# Patient Record
Sex: Male | Born: 1995 | Race: White | Hispanic: No | Marital: Single | Smoking: Never smoker
Health system: Southern US, Community
[De-identification: ages and names within clinical notes are randomized; demographics above are authoritative.]

## PROBLEM LIST (undated history)

## (undated) DIAGNOSIS — M5136 Other intervertebral disc degeneration, lumbar region: Secondary | ICD-10-CM

## (undated) DIAGNOSIS — Z9889 Other specified postprocedural states: Secondary | ICD-10-CM

## (undated) DIAGNOSIS — M51369 Other intervertebral disc degeneration, lumbar region without mention of lumbar back pain or lower extremity pain: Secondary | ICD-10-CM

## (undated) DIAGNOSIS — M5126 Other intervertebral disc displacement, lumbar region: Secondary | ICD-10-CM

## (undated) DIAGNOSIS — T8859XA Other complications of anesthesia, initial encounter: Secondary | ICD-10-CM

## (undated) DIAGNOSIS — R112 Nausea with vomiting, unspecified: Secondary | ICD-10-CM

## (undated) HISTORY — PX: WISDOM TOOTH EXTRACTION: SHX21

## (undated) HISTORY — PX: TONSILLECTOMY: SUR1361

---
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
## 2017-06-09 ENCOUNTER — Emergency Department (HOSPITAL_BASED_OUTPATIENT_CLINIC_OR_DEPARTMENT_OTHER)
Admission: EM | Admit: 2017-06-09 | Discharge: 2017-06-09 | Disposition: A | Discharge status: Home / Self Care | Payer: Worker's Compensation | Attending: Emergency Medicine | Admitting: Emergency Medicine

## 2017-06-09 ENCOUNTER — Encounter (HOSPITAL_BASED_OUTPATIENT_CLINIC_OR_DEPARTMENT_OTHER): Payer: Self-pay | Admitting: *Deleted

## 2017-06-09 ENCOUNTER — Other Ambulatory Visit: Payer: Self-pay

## 2017-06-09 ENCOUNTER — Emergency Department (HOSPITAL_BASED_OUTPATIENT_CLINIC_OR_DEPARTMENT_OTHER): Payer: Worker's Compensation

## 2017-06-09 DIAGNOSIS — Y9289 Other specified places as the place of occurrence of the external cause: Secondary | ICD-10-CM | POA: Diagnosis not present

## 2017-06-09 DIAGNOSIS — W19XXXA Unspecified fall, initial encounter: Secondary | ICD-10-CM | POA: Insufficient documentation

## 2017-06-09 DIAGNOSIS — S83095A Other dislocation of left patella, initial encounter: Secondary | ICD-10-CM | POA: Insufficient documentation

## 2017-06-09 DIAGNOSIS — Y990 Civilian activity done for income or pay: Secondary | ICD-10-CM | POA: Insufficient documentation

## 2017-06-09 DIAGNOSIS — Y9389 Activity, other specified: Secondary | ICD-10-CM | POA: Diagnosis not present

## 2017-06-09 DIAGNOSIS — S83005A Unspecified dislocation of left patella, initial encounter: Secondary | ICD-10-CM

## 2017-06-09 DIAGNOSIS — M545 Low back pain, unspecified: Secondary | ICD-10-CM

## 2017-06-09 DIAGNOSIS — S80922A Unspecified superficial injury of left lower leg, initial encounter: Secondary | ICD-10-CM | POA: Diagnosis present

## 2017-06-09 MED ORDER — NAPROXEN 500 MG PO TABS: 500.0000 mg | ORAL_TABLET | Freq: Two times a day (BID) | ORAL | 1 refills | Status: DC

## 2017-06-09 MED ORDER — HYDROCODONE-ACETAMINOPHEN 5-325 MG PO TABS: 1.0000 | ORAL_TABLET | ORAL | 0 refills | Status: DC | PRN

## 2017-06-09 NOTE — ED Triage Notes (Addendum)
Pt jumped off back of truck while working and left kneecap dislocated but went back in place. States he is having pain and kneecap has slipped out since original injury. He is wearing a family member's brace from home

## 2017-06-09 NOTE — Discharge Instructions (Signed)
Return if any problems. Call Dr. Barbaraann Barthel on Monday to be seeen.

## 2017-06-10 NOTE — ED Provider Notes (Signed)
[facility] Provider Note   CSN: [id number] Arrival date & time: 06/09/17  2003     History   Chief Complaint Chief Complaint  Patient presents with  . Leg Pain    HPI Gordon [name] is a 22 y.o. male.  The history is provided by the patient. No language interpreter was used.  Leg Pain   This is a new problem. The current episode started 1 to 2 hours ago. The problem occurs constantly. The problem has not changed since onset.The pain is present in the left knee. The pain is moderate. Associated symptoms include limited range of motion. He has tried nothing for the symptoms. The treatment provided no relief. There has been no history of extremity trauma.  Pt complains that his knee dislocated when he fell at work.    Past Medical History:  Diagnosis Date  . Bulging lumbar disc     Patient Active Problem List   Diagnosis Date Noted  . Low back pain 03/25/2014    Past Surgical History:  Procedure Laterality Date  . TONSILLECTOMY    . WISDOM TOOTH EXTRACTION          Home Medications    Prior to Admission medications   Medication Sig Start Date End Date Taking? Authorizing Provider  cetirizine (ZYRTEC) 10 MG tablet Take 10 mg by mouth daily.   Yes [provider]  HYDROcodone-acetaminophen (NORCO/VICODIN) 5-325 MG tablet Take 1-2 tablets by mouth every 4 (four) hours as needed. 06/09/17   Fransico Meadow, PA-C  metaxalone (SKELAXIN) 800 MG tablet Take 1 tablet (800 mg total) by mouth 3 (three) times daily. 08/30/15   Hudnall, Sharyn Lull, MD  naproxen (NAPROSYN) 500 MG tablet Take 1 tablet (500 mg total) by mouth 2 (two) times daily with a meal. Start AFTER finishing prednisone 06/09/17   Fransico Meadow, PA-C  predniSONE (DELTASONE) 10 MG tablet 6 tabs po days 1-2, 5 tabs po days 3-4, 4 tabs po days 5-6, 3 tabs po days 7-8, 2 tabs po days 9-10, 1 tab po days 11-12 08/30/15   Hudnall, Sharyn Lull, MD  traMADol (ULTRAM) 50 MG tablet Take  1 tablet (50 mg total) by mouth every 8 (eight) hours as needed. 06/24/15   Dene Gentry, MD    Family History No family history on file.  Social History Social History   Tobacco Use  . Smoking status: Never Smoker  Substance Use Topics  . Alcohol use: No    Alcohol/week: 0.0 oz  . Drug use: No     Allergies   Patient has no known allergies.   Review of Systems Review of Systems  Musculoskeletal: Positive for joint swelling.  All other systems reviewed and are negative.    Physical Exam Updated Vital Signs BP 130/71 (BP Location: Left Leg)   Pulse 95   Temp 99.7 F (37.6 C) (Oral)   Resp 18   Ht 6\' 3"  (1.905 m)   Wt 97.5 kg (215 lb)   SpO2 100%   BMI 26.87 kg/m   Physical Exam  Constitutional: He appears well-developed and well-nourished.  HENT:  Head: Normocephalic.  Musculoskeletal: He exhibits tenderness.  Swollen left knee, pain with movement,  Nv and ns intact   Neurological: He is alert.  Skin: Skin is warm.  Psychiatric: He has a normal mood and affect.  Nursing note and vitals reviewed.    ED Treatments / Results  Labs (all labs ordered are listed, but  only abnormal results are displayed) Labs Reviewed - No data to display  EKG None  Radiology Dg Knee Complete 4 Views Left  Result Date: 06/09/2017 CLINICAL DATA:  Left knee pain after jumping off truck. EXAM: LEFT KNEE - COMPLETE 4+ VIEW COMPARISON:  None. FINDINGS: No evidence of fracture, dislocation, or joint effusion. No evidence of arthropathy or other focal bone abnormality. Soft tissues are unremarkable. IMPRESSION: Negative. Electronically Signed   By: Rolm Baptise M.D.   On: 06/09/2017 20:41    Procedures Procedures (including critical care time)  Medications Ordered in ED Medications - No data to display   Initial Impression / Assessment and Plan / ED Course  I have reviewed the triage vital signs and the nursing notes.  Pertinent labs & imaging results that were  available during my care of the patient were reviewed by me and considered in my medical decision making (see chart for details).     Xray no fracture.  Pt placed in a knee imbolizer.  Pt advised to follow up with Dr. Barbaraann Barthel or workers comp Orthopaedist this week.   Final Clinical Impressions(s) / ED Diagnoses   Final diagnoses:  Patellar dislocation, left, initial encounter    ED Discharge Orders        Ordered    naproxen (NAPROSYN) 500 MG tablet  2 times daily with meals     06/09/17 2058    HYDROcodone-acetaminophen (NORCO/VICODIN) 5-325 MG tablet  Every 4 hours PRN     06/09/17 2107    An After Visit Summary was printed and given to the patient.    Fransico Meadow, [place] 06/10/17 1435    Quintella Reichert, MD 06/10/17 [phone]

## 2017-06-11 ENCOUNTER — Encounter: Payer: Self-pay | Admitting: Family Medicine

## 2017-06-11 ENCOUNTER — Ambulatory Visit (INDEPENDENT_AMBULATORY_CARE_PROVIDER_SITE_OTHER): Payer: Worker's Compensation | Admitting: Family Medicine

## 2017-06-11 DIAGNOSIS — S8992XA Unspecified injury of left lower leg, initial encounter: Secondary | ICD-10-CM | POA: Diagnosis not present

## 2017-06-11 MED ORDER — HYDROCODONE-ACETAMINOPHEN 5-325 MG PO TABS: 1.0000 | ORAL_TABLET | Freq: Four times a day (QID) | ORAL | 0 refills | Status: DC | PRN

## 2017-06-11 NOTE — Patient Instructions (Signed)
You have had a patellar dislocation (kneecap). Ice the area for 15 minutes at a time 3-4 times a day. Elevate above the level of your heart when possible. Take aleve 2 tabs twice a day with food for pain and inflammation. Norco as needed for severe pain. Knee immobilizer for 2 weeks then return for follow-up. Do not try to bend your knee - take immobilizer off only to wash the area and ice it. Quad sets and straight leg raises when tolerated up to 3 sets of 10 once a day. We will consider hinged knee brace after the immobilization period. We will start physical therapy when you return for follow-up. If struggling we will consider an MRI.

## 2017-06-13 ENCOUNTER — Encounter: Payer: Self-pay | Admitting: Family Medicine

## 2017-06-13 DIAGNOSIS — S8992XD Unspecified injury of left lower leg, subsequent encounter: Secondary | ICD-10-CM | POA: Insufficient documentation

## 2017-06-13 NOTE — Assessment & Plan Note (Signed)
independently reviewed radiographs and no evidence fracture.  2/2 patellar dislocation.  Continue immobilizer.  Elevation, icing.  Aleve with norco as needed.  Quad sets, straight leg raises when tolerated.  F/u in 2 weeks.

## 2017-06-13 NOTE — Progress Notes (Signed)
PCP: Chesley Noon, MD  Subjective:   HPI: Patient is a 21 y.o. male here for left knee injury.  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 Showed me a video of the injury where his patella dislocated laterally and within a few seconds popped back into place. This has felt unstable anteriorly since the injury. Wearing immobilizer and using crutches which help. Pain level 5-6/10 and sharp. Taking oxycodone occasionally. No prior knee injuries. + swelling and bruising.  Localized numbness.  Past Medical History:  Diagnosis Date  . Bulging lumbar disc     Current Outpatient Medications on File Prior to Visit  Medication Sig Dispense Refill  . cetirizine (ZYRTEC) 10 MG tablet Take 10 mg by mouth daily.     No current facility-administered medications on file prior to visit.     Past Surgical History:  Procedure Laterality Date  . TONSILLECTOMY    . WISDOM TOOTH EXTRACTION      No Known Allergies  Social History   Socioeconomic History  . Marital status: Single    Spouse name: Not on file  . Number of children: Not on file  . Years of education: Not on file  . Highest education level: Not on file  Occupational History  . Not on file  Social Needs  . Financial resource strain: Not on file  . Food insecurity:    Worry: Not on file    Inability: Not on file  . Transportation needs:    Medical: Not on file    Non-medical: Not on file  Tobacco Use  . Smoking status: Never Smoker  . Smokeless tobacco: Never Used  Substance and Sexual Activity  . Alcohol use: No    Alcohol/week: 0.0 oz  . Drug use: No  . Sexual activity: Not on file  Lifestyle  . Physical activity:    Days per week: Not on file    Minutes per session: Not on file  . Stress: Not on file  Relationships  . Social connections:    Talks on phone: Not on file    Gets together: Not on file    Attends religious  service: Not on file    Active member of club or organization: Not on file    Attends meetings of clubs or organizations: Not on file    Relationship status: Not on file  . Intimate partner violence:    Fear of current or ex partner: Not on file    Emotionally abused: Not on file    Physically abused: Not on file    Forced sexual activity: Not on file  Other Topics Concern  . Not on file  Social History Narrative  . Not on file    History reviewed. No pertinent family history.  Ht 6\' 3"  (1.905 m)   Wt 215 lb (97.5 kg)   BMI 26.87 kg/m   Review of Systems: See HPI above.     Objective:  Physical Exam:  Gen: NAD, comfortable in exam room  Left knee: Severe swelling, bruising anteriorly. TTP diffusely anteriorly including joint lines, post patellar facets. Full extension with strength to do straight leg raise.  Did not test flexion due to injury type. NV intact distally.  Right knee: No deformity. FROM with 5/5 strength. No tenderness to palpation. NVI distally.   Assessment & Plan:  1. Left knee injury - independently reviewed radiographs  and no evidence fracture.  2/2 patellar dislocation.  Continue immobilizer.  Elevation, icing.  Aleve with norco as needed.  Quad sets, straight leg raises when tolerated.  F/u in 2 weeks.

## 2017-06-25 ENCOUNTER — Encounter: Payer: Self-pay | Admitting: Family Medicine

## 2017-06-25 ENCOUNTER — Ambulatory Visit (INDEPENDENT_AMBULATORY_CARE_PROVIDER_SITE_OTHER): Payer: Worker's Compensation | Admitting: Family Medicine

## 2017-06-25 DIAGNOSIS — S8992XD Unspecified injury of left lower leg, subsequent encounter: Secondary | ICD-10-CM | POA: Diagnosis not present

## 2017-06-25 DIAGNOSIS — S8992XA Unspecified injury of left lower leg, initial encounter: Secondary | ICD-10-CM

## 2017-06-25 NOTE — Patient Instructions (Signed)
This is still most likely a patellar dislocation but it's very unusual that you have medial joint line pain, no pain around the kneecap now.  It brings up a concern for medial meniscus tear, possible ligament tear of your knee. We will go ahead with an MRI. Ice the area for 15 minutes at a time 3-4 times a day. Elevate above the level of your heart when possible. Take aleve 2 tabs twice a day with food for pain and inflammation. Norco as needed for severe pain. Switch to hinged knee brace - wear at all times when up and walking around. Quad sets and straight leg raises when tolerated up to 3 sets of 10 once a day. Start physical therapy as well. Follow up in 1 month but we will talk sooner when we get the MRI results.

## 2017-06-27 ENCOUNTER — Encounter: Payer: Self-pay | Admitting: Family Medicine

## 2017-06-27 NOTE — Progress Notes (Signed)
PCP: Chesley Noon, MD  Subjective:   HPI: Patient is a 22 y.o. male here for left knee injury.  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 Showed me a video of the injury where his patella dislocated laterally and within a few seconds popped back into place. This has felt unstable anteriorly since the injury. Wearing immobilizer and using crutches which help. Pain level 5-6/10 and sharp. Taking oxycodone occasionally. No prior knee injuries. + swelling and bruising.  Localized numbness.  6/3: Patient returns feeling about the same as last visit. Pain level is 5 out of 10 and sharp anterior left knee. He has been icing and wearing the immobilizer. His pain is worse at night when he takes Norco as needed. Continues to have swelling and bruising. Localized numbness as well.  Past Medical History:  Diagnosis Date  . Bulging lumbar disc     Current Outpatient Medications on File Prior to Visit  Medication Sig Dispense Refill  . cetirizine (ZYRTEC) 10 MG tablet Take 10 mg by mouth daily.    Marland Kitchen HYDROcodone-acetaminophen (NORCO/VICODIN) 5-325 MG tablet Take 1 tablet by mouth every 6 (six) hours as needed. 20 tablet 0   No current facility-administered medications on file prior to visit.     Past Surgical History:  Procedure Laterality Date  . TONSILLECTOMY    . WISDOM TOOTH EXTRACTION      No Known Allergies  Social History   Socioeconomic History  . Marital status: Single    Spouse name: Not on file  . Number of children: Not on file  . Years of education: Not on file  . Highest education level: Not on file  Occupational History  . Not on file  Social Needs  . Financial resource strain: Not on file  . Food insecurity:    Worry: Not on file    Inability: Not on file  . Transportation needs:    Medical: Not on file    Non-medical: Not on file  Tobacco Use  . Smoking  status: Never Smoker  . Smokeless tobacco: Never Used  Substance and Sexual Activity  . Alcohol use: No    Alcohol/week: 0.0 oz  . Drug use: No  . Sexual activity: Not on file  Lifestyle  . Physical activity:    Days per week: Not on file    Minutes per session: Not on file  . Stress: Not on file  Relationships  . Social connections:    Talks on phone: Not on file    Gets together: Not on file    Attends religious service: Not on file    Active member of club or organization: Not on file    Attends meetings of clubs or organizations: Not on file    Relationship status: Not on file  . Intimate partner violence:    Fear of current or ex partner: Not on file    Emotionally abused: Not on file    Physically abused: Not on file    Forced sexual activity: Not on file  Other Topics Concern  . Not on file  Social History Narrative  . Not on file    History reviewed. No pertinent family history.  BP 127/75   Pulse 81   Ht 6\' 3"  (1.905 m)   Wt 215 lb (97.5 kg)   BMI 26.87 kg/m   Review of Systems: See  HPI above.     Objective:  Physical Exam:  Gen: NAD, comfortable in exam room  Left knee: Mild improvement in severe swelling and bruising but still present anteriorly.   TTP medial joint line.  No other tenderness including post patellar facets.   ROM limited - 0 - 30 degrees only, painful. Unable to perform ant/post drawers.  Mild laxity valgus, negative varus.  Negative lachmanns. Negative mcmurrays, apleys, patellar apprehension. NV intact distally.  Assessment & Plan:  1. Left knee injury - 2 weeks out from his injury.  Radiographs were negative.  His history, video suggest patellar dislocation but his medial ligamentous structures are intact and he has no pain or laxity on patellar apprehension which are very unusual if this were the true diagnosis only 2 weeks out from the injury.  Concern for multiligamentous injury as well with instability that can cause  appearance of patellar dislocation.  Advised we go ahead with MRI at this time to further assess.  Change to hinged knee brace.  Home exercises, start physical therapy.  Aleve with icing.  norco if needed.

## 2017-06-27 NOTE — Assessment & Plan Note (Signed)
2 weeks out from his injury.  Radiographs were negative.  His history, video suggest patellar dislocation but his medial ligamentous structures are intact and he has no pain or laxity on patellar apprehension which are very unusual if this were the true diagnosis only 2 weeks out from the injury.  Concern for multiligamentous injury as well with instability that can cause appearance of patellar dislocation.  Advised we go ahead with MRI at this time to further assess.  Change to hinged knee brace.  Home exercises, start physical therapy.  Aleve with icing.  norco if needed.

## 2017-07-03 ENCOUNTER — Encounter: Payer: Self-pay | Admitting: Family Medicine

## 2017-07-03 ENCOUNTER — Ambulatory Visit (INDEPENDENT_AMBULATORY_CARE_PROVIDER_SITE_OTHER): Payer: Worker's Compensation | Admitting: Family Medicine

## 2017-07-03 DIAGNOSIS — S8992XD Unspecified injury of left lower leg, subsequent encounter: Secondary | ICD-10-CM

## 2017-07-03 NOTE — Patient Instructions (Signed)
Continue the physical therapy for 4 weeks then follow up with me. Wear the brace when up and walking around. Icing 15 minutes at a time, elevation as needed.

## 2017-07-04 ENCOUNTER — Encounter: Payer: Self-pay | Admitting: Family Medicine

## 2017-07-04 NOTE — Assessment & Plan Note (Signed)
MRI reviewed and discussed with patient.  He does have evidence of patellar dislocation with MPFL tear.  Surprising there's not additional damage and that he has much less pain than is expected with negative apprehension test only a couple weeks out from injury.  Knee brace when up and walking around - continue physical therapy.  Out of work for 4 more weeks, plan to follow up at that time.  Icing, elevation as needed.

## 2017-07-04 NOTE — Progress Notes (Signed)
MRI reviewed and discussed with patient.  He does have evidence of patellar dislocation with MPFL tear.  Surprising there's not additional damage and that he has much less pain than is expected with negative apprehension test only a couple weeks out from injury.  Knee brace when up and walking around - continue physical therapy.  Out of work for 4 more weeks, plan to follow up at that time.  Icing, elevation as needed.

## 2017-07-11 ENCOUNTER — Encounter: Payer: Self-pay | Admitting: Family Medicine

## 2017-07-31 ENCOUNTER — Encounter: Payer: Self-pay | Admitting: Family Medicine

## 2017-07-31 ENCOUNTER — Ambulatory Visit (INDEPENDENT_AMBULATORY_CARE_PROVIDER_SITE_OTHER): Payer: Worker's Compensation | Admitting: Family Medicine

## 2017-07-31 DIAGNOSIS — S8992XD Unspecified injury of left lower leg, subsequent encounter: Secondary | ICD-10-CM | POA: Diagnosis not present

## 2017-07-31 NOTE — Patient Instructions (Signed)
Wear the brace for 6 more weeks at work and when on irregular surfaces. Do home exercises 3 times a week for 6 more weeks.

## 2017-08-01 ENCOUNTER — Encounter: Payer: Self-pay | Admitting: Family Medicine

## 2017-08-01 NOTE — Progress Notes (Signed)
PCP: Chesley Noon, MD  Subjective:   HPI: Patient is a 22 y.o. male here for left knee injury.  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 Showed me a video of the injury where his patella dislocated laterally and within a few seconds popped back into place. This has felt unstable anteriorly since the injury. Wearing immobilizer and using crutches which help. Pain level 5-6/10 and sharp. Taking oxycodone occasionally. No prior knee injuries. + swelling and bruising.  Localized numbness.  6/3: Patient returns feeling about the same as last visit. Pain level is 5 out of 10 and sharp anterior left knee. He has been icing and wearing the immobilizer. His pain is worse at night when he takes Norco as needed. Continues to have swelling and bruising. Localized numbness as well.  7/9: Patient reports he's doing well. Pain level 0/10. Wearing knee brace, doing home exercises and has been doing physical therapy. No skin changes.  Past Medical History:  Diagnosis Date  . Bulging lumbar disc     Current Outpatient Medications on File Prior to Visit  Medication Sig Dispense Refill  . cetirizine (ZYRTEC) 10 MG tablet Take 10 mg by mouth daily.    Marland Kitchen HYDROcodone-acetaminophen (NORCO/VICODIN) 5-325 MG tablet Take 1 tablet by mouth every 6 (six) hours as needed. 20 tablet 0   No current facility-administered medications on file prior to visit.     Past Surgical History:  Procedure Laterality Date  . TONSILLECTOMY    . WISDOM TOOTH EXTRACTION      No Known Allergies  Social History   Socioeconomic History  . Marital status: Single    Spouse name: Not on file  . Number of children: Not on file  . Years of education: Not on file  . Highest education level: Not on file  Occupational History  . Not on file  Social Needs  . Financial resource strain: Not on file  . Food insecurity:     Worry: Not on file    Inability: Not on file  . Transportation needs:    Medical: Not on file    Non-medical: Not on file  Tobacco Use  . Smoking status: Never Smoker  . Smokeless tobacco: Never Used  Substance and Sexual Activity  . Alcohol use: No    Alcohol/week: 0.0 oz  . Drug use: No  . Sexual activity: Not on file  Lifestyle  . Physical activity:    Days per week: Not on file    Minutes per session: Not on file  . Stress: Not on file  Relationships  . Social connections:    Talks on phone: Not on file    Gets together: Not on file    Attends religious service: Not on file    Active member of club or organization: Not on file    Attends meetings of clubs or organizations: Not on file    Relationship status: Not on file  . Intimate partner violence:    Fear of current or ex partner: Not on file    Emotionally abused: Not on file    Physically abused: Not on file    Forced sexual activity: Not on file  Other Topics Concern  . Not on file  Social History Narrative  . Not on file    History reviewed. No pertinent family history.  BP 113/69   Pulse 66  Ht 6\' 3"  (1.905 m)   Wt 215 lb (97.5 kg)   BMI 26.87 kg/m   Review of Systems: See HPI above.     Objective:  Physical Exam:  Gen: NAD, comfortable in exam room  Left knee: No gross deformity, ecchymoses, effusion. No TTP. FROM with 5/5 strength. Negative ant/post drawers. Negative valgus/varus testing. Negative lachmanns. Negative mcmurrays, apleys, patellar apprehension. NV intact distally.  Assessment & Plan:  1. Left knee injury - 2/2 patellar dislocation with MPFL tear.  Clinically improved following physical therapy, home exercises.  Discontinue physical therapy - continue home exercises 3 times a week for 6 more weeks.  Wear knee brace for 6 more weeks at work and when on irregular surfaces.  Icing, tylenol, ibuprofen only if needed.

## 2017-08-01 NOTE — Assessment & Plan Note (Signed)
2/2 patellar dislocation with MPFL tear.  Clinically improved following physical therapy, home exercises.  Discontinue physical therapy - continue home exercises 3 times a week for 6 more weeks.  Wear knee brace for 6 more weeks at work and when on irregular surfaces.  Icing, tylenol, ibuprofen only if needed.

## 2020-04-02 ENCOUNTER — Emergency Department (HOSPITAL_COMMUNITY): Payer: 59

## 2020-04-02 ENCOUNTER — Inpatient Hospital Stay (HOSPITAL_BASED_OUTPATIENT_CLINIC_OR_DEPARTMENT_OTHER)
Admission: EM | Admit: 2020-04-02 | Discharge: 2020-04-06 | DRG: 027 | Disposition: A | Discharge status: Home / Self Care | Payer: 59 | Attending: Neurological Surgery | Admitting: Neurological Surgery

## 2020-04-02 ENCOUNTER — Emergency Department (HOSPITAL_BASED_OUTPATIENT_CLINIC_OR_DEPARTMENT_OTHER): Payer: 59

## 2020-04-02 ENCOUNTER — Other Ambulatory Visit: Payer: Self-pay

## 2020-04-02 ENCOUNTER — Encounter (HOSPITAL_BASED_OUTPATIENT_CLINIC_OR_DEPARTMENT_OTHER): Payer: Self-pay | Admitting: *Deleted

## 2020-04-02 DIAGNOSIS — W19XXXA Unspecified fall, initial encounter: Secondary | ICD-10-CM | POA: Diagnosis present

## 2020-04-02 DIAGNOSIS — C719 Malignant neoplasm of brain, unspecified: Secondary | ICD-10-CM | POA: Diagnosis present

## 2020-04-02 DIAGNOSIS — Z792 Long term (current) use of antibiotics: Secondary | ICD-10-CM | POA: Diagnosis not present

## 2020-04-02 DIAGNOSIS — R569 Unspecified convulsions: Secondary | ICD-10-CM | POA: Diagnosis not present

## 2020-04-02 DIAGNOSIS — R253 Fasciculation: Secondary | ICD-10-CM | POA: Diagnosis present

## 2020-04-02 DIAGNOSIS — D496 Neoplasm of unspecified behavior of brain: Secondary | ICD-10-CM | POA: Diagnosis present

## 2020-04-02 DIAGNOSIS — Z20822 Contact with and (suspected) exposure to covid-19: Secondary | ICD-10-CM | POA: Diagnosis present

## 2020-04-02 DIAGNOSIS — G939 Disorder of brain, unspecified: Secondary | ICD-10-CM | POA: Diagnosis not present

## 2020-04-02 DIAGNOSIS — M5126 Other intervertebral disc displacement, lumbar region: Secondary | ICD-10-CM | POA: Diagnosis present

## 2020-04-02 DIAGNOSIS — G9389 Other specified disorders of brain: Principal | ICD-10-CM

## 2020-04-02 LAB — URINALYSIS, MICROSCOPIC (REFLEX): Bacteria, UA: NONE SEEN

## 2020-04-02 LAB — CBC WITH DIFFERENTIAL/PLATELET
Abs Immature Granulocytes: 0.04 10*3/uL (ref 0.00–0.07)
Basophils Absolute: 0.1 10*3/uL (ref 0.0–0.1)
Basophils Relative: 0 %
Eosinophils Absolute: 0.1 10*3/uL (ref 0.0–0.5)
Eosinophils Relative: 1 %
HCT: 44.6 % (ref 39.0–52.0)
Hemoglobin: 15.7 g/dL (ref 13.0–17.0)
Immature Granulocytes: 0 %
Lymphocytes Relative: 17 %
Lymphs Abs: 2.1 10*3/uL (ref 0.7–4.0)
MCH: 30.4 pg (ref 26.0–34.0)
MCHC: 35.2 g/dL (ref 30.0–36.0)
MCV: 86.3 fL (ref 80.0–100.0)
Monocytes Absolute: 0.6 10*3/uL (ref 0.1–1.0)
Monocytes Relative: 5 %
Neutro Abs: 9.7 10*3/uL — ABNORMAL HIGH (ref 1.7–7.7)
Neutrophils Relative %: 77 %
Platelets: 331 10*3/uL (ref 150–400)
RBC: 5.17 MIL/uL (ref 4.22–5.81)
RDW: 12.6 % (ref 11.5–15.5)
WBC: 12.5 10*3/uL — ABNORMAL HIGH (ref 4.0–10.5)
nRBC: 0 % (ref 0.0–0.2)

## 2020-04-02 LAB — COMPREHENSIVE METABOLIC PANEL
ALT: 15 U/L (ref 0–44)
AST: 22 U/L (ref 15–41)
Albumin: 5.1 g/dL — ABNORMAL HIGH (ref 3.5–5.0)
Alkaline Phosphatase: 49 U/L (ref 38–126)
Anion gap: 12 (ref 5–15)
BUN: 13 mg/dL (ref 6–20)
CO2: 24 mmol/L (ref 22–32)
Calcium: 10 mg/dL (ref 8.9–10.3)
Chloride: 102 mmol/L (ref 98–111)
Creatinine, Ser: 1.04 mg/dL (ref 0.61–1.24)
GFR, Estimated: >60 mL/min (ref >60)
Glucose, Bld: 102 mg/dL — ABNORMAL HIGH (ref 70–99)
Potassium: 3.7 mmol/L (ref 3.5–5.1)
Sodium: 138 mmol/L (ref 135–145)
Total Bilirubin: 0.3 mg/dL (ref 0.3–1.2)
Total Protein: 8 g/dL (ref 6.5–8.1)

## 2020-04-02 LAB — RAPID URINE DRUG SCREEN, HOSP PERFORMED
Amphetamines: NOT DETECTED
Barbiturates: NOT DETECTED
Benzodiazepines: NOT DETECTED
Cocaine: NOT DETECTED
Opiates: NOT DETECTED
Tetrahydrocannabinol: NOT DETECTED

## 2020-04-02 LAB — URINALYSIS, ROUTINE W REFLEX MICROSCOPIC
Bilirubin Urine: NEGATIVE
Glucose, UA: NEGATIVE mg/dL
Ketones, ur: NEGATIVE mg/dL
Leukocytes,Ua: NEGATIVE
Nitrite: NEGATIVE
Protein, ur: NEGATIVE mg/dL
Specific Gravity, Urine: 1.01 (ref 1.005–1.030)
pH: 6 (ref 5.0–8.0)

## 2020-04-02 LAB — RESP PANEL BY RT-PCR (FLU A&B, COVID) ARPGX2
Influenza A by PCR: NEGATIVE
Influenza B by PCR: NEGATIVE
SARS Coronavirus 2 by RT PCR: NEGATIVE

## 2020-04-02 LAB — CK: Total CK: 335 U/L (ref 49–397)

## 2020-04-02 MED ORDER — SODIUM CHLORIDE 0.9 % IV BOLUS
1000.0000 mL | Freq: Once | INTRAVENOUS | Status: AC
  Administered 2020-04-02: 1000 mL via INTRAVENOUS

## 2020-04-02 MED ORDER — ACETAMINOPHEN 500 MG PO TABS
1000.0000 mg | ORAL_TABLET | Freq: Once | ORAL | Status: AC
  Administered 2020-04-03: 1000 mg via ORAL
  Filled 2020-04-02: qty 2

## 2020-04-02 MED ORDER — GADOBUTROL 1 MMOL/ML IV SOLN
10.0000 mL | Freq: Once | INTRAVENOUS | Status: AC | PRN
  Administered 2020-04-02: 10 mL via INTRAVENOUS

## 2020-04-02 MED ORDER — LEVETIRACETAM IN NACL 1000 MG/100ML IV SOLN
INTRAVENOUS | Status: AC
  Filled 2020-04-02: qty 100

## 2020-04-02 MED ORDER — LEVETIRACETAM IN NACL 1000 MG/100ML IV SOLN
1000.0000 mg | Freq: Once | INTRAVENOUS | Status: AC
  Administered 2020-04-02: 1000 mg via INTRAVENOUS
  Filled 2020-04-02: qty 100

## 2020-04-02 NOTE — ED Notes (Signed)
[facility] Transport Team at bedside 

## 2020-04-02 NOTE — H&P (Signed)
History and Physical   Gordon [name] Merry CNO:[id number] DOB: 10/10/1995 DOA: 04/02/2020  Referring MD/NP/PA: Dr. Vanita Panda  PCP: Chesley Noon, MD   Outpatient Specialists: None  Patient coming from: Home  Chief Complaint: Seizure  HPI: Gordon [name] is a 25 y.o. male with medical history significant of mainly bulging lumbar disc but no other significant findings who was otherwise healthy until today while at home walking his parents dog at home started having twitching on his face as he is grabbed the back to handle hip literally passed out.  Home has video camera that recorded all the incidents.  Mother came a few hours later and found him down.  He was noted to have actually had first few minutes of tonic-clonic seizures went backward and heat bicarbon box near the door.  His target.  He was postictal for more than 10 minutes.  He apparently got himself up to a chair and got inside the house.  He is currently having some mild headache.  In the ER he was evaluated and found to have a lesion in his brain that looks like a mass versus ischemia.  Patient admitted to the hospital after neurology evaluation.  No prior history of seizure disorder.  No prior history of head trauma..  ED Course: Temperature 98.4 blood pressure 156/82 pulse 98 respirate of 22 and oxygen sat 96% on room air.COVID-19 screen is negative.  White count is 12.5 otherwise CBC and chemistry mostly within normal.  Urinalysis, urine drug screen and COVID-19 screen all negative.  Head CT without contrast showed an ill-defined area of cortical and subcortical low density in the left frontal lobe questionably edema and trace left to right midline shift.  Suspicion for neoplasm.  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  Neurology is consulted.  Patient being admitted to the medical service.  Review of Systems: As per HPI  otherwise 10 point review of systems negative.    Past Medical History:  Diagnosis Date  . Bulging lumbar disc     Past Surgical History:  Procedure Laterality Date  . TONSILLECTOMY    . WISDOM TOOTH EXTRACTION       reports that he has never smoked. He has never used smokeless tobacco. He reports that he does not drink alcohol and does not use drugs.  No Known Allergies  History reviewed. No pertinent family history.   Prior to Admission medications   Medication Sig Start Date End Date Taking? Authorizing Provider  amoxicillin (AMOXIL) 500 MG capsule Take 500 mg by mouth 3 (three) times daily. 10/28/19   [provider]    Physical Exam: Vitals:   04/02/20 1909 04/02/20 2014 04/02/20 2130 04/02/20 2204  BP: (!) 156/82 118/70 119/65   Pulse: 82 78 76   Resp: 18 16 19    Temp:    98.4 F (36.9 C)  TempSrc:    Oral  SpO2: 100% 100% 96%   Weight:      Height:          Constitutional: No distress Vitals:   04/02/20 1909 04/02/20 2014 04/02/20 2130 04/02/20 2204  BP: (!) 156/82 118/70 119/65   Pulse: 82 78 76   Resp: 18 16 19    Temp:    98.4 F (36.9 C)  TempSrc:    Oral  SpO2: 100% 100% 96%   Weight:      Height:       Eyes:  PERRL, lids and conjunctivae normal ENMT: Mucous membranes are moist. Posterior pharynx clear of any exudate or lesions.Normal dentition.  Neck: normal, supple, no masses, no thyromegaly Respiratory: clear to auscultation bilaterally, no wheezing, no crackles. Normal respiratory effort. No accessory muscle use.  Cardiovascular: Regular rate and rhythm, no murmurs / rubs / gallops. No extremity edema. 2+ pedal pulses. No carotid bruits.  Abdomen: no tenderness, no masses palpated. No hepatosplenomegaly. Bowel sounds positive.  Musculoskeletal: no clubbing / cyanosis. No joint deformity upper and lower extremities. Good ROM, no contractures. Normal muscle tone.  Skin: no rashes, lesions, ulcers. No induration Neurologic: CN 2-12  grossly intact. Sensation mildly decreased on the right upper extremity, DTR normal. Strength 5/5 in all 4.  Psychiatric: Normal judgment and insight. Alert and oriented x 3. Normal mood.     Labs on Admission: I have personally reviewed following labs and imaging studies  CBC: Recent Labs  Lab 04/02/20 1730  WBC 12.5*  NEUTROABS 9.7*  HGB 15.7  HCT 44.6  MCV 86.3  PLT 010   Basic Metabolic Panel: Recent Labs  Lab 04/02/20 1730  NA 138  K 3.7  CL 102  CO2 24  GLUCOSE 102*  BUN 13  CREATININE 1.04  CALCIUM 10.0   GFR: Estimated Creatinine Clearance: 132 mL/min (by C-G formula based on SCr of 1.04 mg/dL). Liver Function Tests: Recent Labs  Lab 04/02/20 1730  AST 22  ALT 15  ALKPHOS 49  BILITOT 0.3  PROT 8.0  ALBUMIN 5.1*   No results for input(s): LIPASE, AMYLASE in the last 168 hours. No results for input(s): AMMONIA in the last 168 hours. Coagulation Profile: No results for input(s): INR, PROTIME in the last 168 hours. Cardiac Enzymes: Recent Labs  Lab 04/02/20 1730  CKTOTAL 335   BNP (last 3 results) No results for input(s): PROBNP in the last 8760 hours. HbA1C: No results for input(s): HGBA1C in the last 72 hours. CBG: No results for input(s): GLUCAP in the last 168 hours. Lipid Profile: No results for input(s): CHOL, HDL, LDLCALC, TRIG, CHOLHDL, LDLDIRECT in the last 72 hours. Thyroid Function Tests: No results for input(s): TSH, T4TOTAL, FREET4, T3FREE, THYROIDAB in the last 72 hours. Anemia Panel: No results for input(s): VITAMINB12, FOLATE, FERRITIN, TIBC, IRON, RETICCTPCT in the last 72 hours. Urine analysis:    Component Value Date/Time   COLORURINE STRAW (A) 04/02/2020 1751   APPEARANCEUR CLEAR 04/02/2020 1751   LABSPEC 1.010 04/02/2020 1751   PHURINE 6.0 04/02/2020 1751   GLUCOSEU NEGATIVE 04/02/2020 1751   HGBUR SMALL (A) 04/02/2020 1751   BILIRUBINUR NEGATIVE 04/02/2020 1751   KETONESUR NEGATIVE 04/02/2020 1751   PROTEINUR  NEGATIVE 04/02/2020 1751   UROBILINOGEN 0.2 08/10/2009 2245   NITRITE NEGATIVE 04/02/2020 1751   LEUKOCYTESUR NEGATIVE 04/02/2020 1751   Sepsis Labs: @LABRCNTIP (procalcitonin:4,lacticidven:4) ) Recent Results (from the past 240 hour(s))  Resp Panel by RT-PCR (Flu A&B, Covid) Nasopharyngeal Swab     Status: None   Collection Time: 04/02/20  7:08 PM   Specimen: Nasopharyngeal Swab; Nasopharyngeal(NP) swabs in vial transport medium  Result Value Ref Range Status   SARS Coronavirus 2 by RT PCR NEGATIVE NEGATIVE Final    Comment: (NOTE) SARS-CoV-2 target nucleic acids are NOT DETECTED.  The SARS-CoV-2 RNA is generally detectable in upper respiratory specimens during the acute phase of infection. The lowest concentration of SARS-CoV-2 viral copies this assay can detect is 138 copies/mL. A negative result does not preclude SARS-Cov-2 infection and should not be used as  the sole basis for treatment or other patient management decisions. A negative result may occur with  improper specimen collection/handling, submission of specimen other than nasopharyngeal swab, presence of viral mutation(s) within the areas targeted by this assay, and inadequate number of viral copies(<138 copies/mL). A negative result must be combined with clinical observations, patient history, and epidemiological information. The expected result is Negative.  Fact Sheet for Patients:  EntrepreneurPulse.com.au  Fact Sheet for Healthcare Providers:  IncredibleEmployment.be  This test is no t yet approved or cleared by the [place] FDA and  has been authorized for detection and/or diagnosis of SARS-CoV-2 by FDA under an Emergency Use Authorization (EUA). This EUA will remain  in effect (meaning this test can be used) for the duration of the COVID-19 declaration under Section 564(b)(1) of the Act, 21 U.S.C.section 360bbb-3(b)(1), unless the authorization is terminated  or  revoked sooner.       Influenza A by PCR NEGATIVE NEGATIVE Final   Influenza B by PCR NEGATIVE NEGATIVE Final    Comment: (NOTE) The Xpert Xpress SARS-CoV-2/FLU/RSV plus assay is intended as an aid in the diagnosis of influenza from Nasopharyngeal swab specimens and should not be used as a sole basis for treatment. Nasal washings and aspirates are unacceptable for Xpert Xpress SARS-CoV-2/FLU/RSV testing.  Fact Sheet for Patients: EntrepreneurPulse.com.au  Fact Sheet for Healthcare Providers: IncredibleEmployment.be  This test is not yet approved or cleared by the [place] FDA and has been authorized for detection and/or diagnosis of SARS-CoV-2 by FDA under an Emergency Use Authorization (EUA). This EUA will remain in effect (meaning this test can be used) for the duration of the COVID-19 declaration under Section 564(b)(1) of the Act, 21 U.S.C. section 360bbb-3(b)(1), unless the authorization is terminated or revoked.  Performed at [facility], [address] [place], [place] [address]      Radiological Exams on Admission: CT Head Wo Contrast  Result Date: 04/02/2020 CLINICAL DATA:  Seizure, nontraumatic (Age 67-40y) EXAM: CT HEAD WITHOUT CONTRAST TECHNIQUE: Contiguous axial images were obtained from the base of the skull through the vertex without intravenous contrast. COMPARISON:  None. FINDINGS: Brain: Ill-defined area of cortical and subcortical low-density involving the left frontal lobe, for example series 2, image 22. The may be mild surrounding edema. Low-density extends to the frontal horn of the left lateral ventricle. No hemorrhage. Trace left right midline shift. Normal ventricular size without hydrocephalus. Basilar cisterns are patent. Vascular: No hyperdense vessel. Skull: No fracture or focal lesion. Sinuses/Orbits: Minimal opacification of left ethmoid air cell. Otherwise clear paranasal sinuses. Mastoid air  cells are well aerated. Included orbits are unremarkable. Other: None. IMPRESSION: 1. Ill-defined area of cortical and subcortical low-density involving the left frontal lobe with questionable mild surrounding edema and trace left-to-right midline shift. Favor neoplasm, with differential of infection or infarct. Recommend further evaluation with MRI, without and with IV contrast. 2. No hemorrhage or midline shift. These results were called by telephone at the time of interpretation on 04/02/2020 at 6:29 pm to provider [facility] , who verbally acknowledged these results. Electronically Signed   By: Keith Rake M.D.   On: 04/02/2020 18:29      Assessment/Plan Principal Problem:   Seizure [facility]) Active Problems:   Brain mass     #1 new onset seizure: Secondary to brain mass.  Patient has been loaded with Keppra.  Neurology consulted.  Patient will likely need neurosurgical consultation also.  #2 new newfound brain mass: Suspicious for anaplastic  astrocytoma.  Again neurology consulted.  We will get neurosurgical consult in the morning.  Continue with Keppra and Decadron.   DVT prophylaxis: SCD Code Status: Full code Family Communication: Mother at bedside Disposition Plan: To be determined Consults called: Dr. Cheral Marker of neurology Admission status: Inpatient  Severity of Illness: The appropriate patient status for this patient is INPATIENT. Inpatient status is judged to be reasonable and necessary in order to provide the required intensity of service to ensure the patient's safety. The patient's presenting symptoms, physical exam findings, and initial radiographic and laboratory data in the context of their chronic comorbidities is felt to place them at high risk for further clinical deterioration. Furthermore, it is not anticipated that the patient will be medically stable for discharge from the hospital within 2 midnights of admission. The following factors support the patient  status of inpatient.   " The patient's presenting symptoms include seizure episode. " The worrisome physical exam findings include mild sensory deficit on the right side. " The initial radiographic and laboratory data are worrisome because of CT and MRI evidence of brain mass. " The chronic co-morbidities include none.   * I certify that at the point of admission it is my clinical judgment that the patient will require inpatient hospital care spanning beyond 2 midnights from the point of admission due to high intensity of service, high risk for further deterioration and high frequency of surveillance required.Barbette Merino MD [facility] Pager [phone]  If 7PM-7AM, please contact night-coverage [email or web address] Password [facility]  04/02/2020, 11:09 PM

## 2020-04-02 NOTE — ED Notes (Signed)
Noted to have abrasion at rt shoulder, both rt and left hand, slight abrasion noted at rt side of head and above rt eye.

## 2020-04-02 NOTE — ED Notes (Signed)
Phone Handoff Report given to [facility] with [employer]

## 2020-04-02 NOTE — ED Provider Notes (Signed)
[place] EMERGENCY DEPARTMENT Provider Note  CSN: [id number] Arrival date & time: 04/02/20 1706    History Chief Complaint  Patient presents with  . Seizures    HPI  Gordon [name] is a 25 y.o. male with no significant PMH reports he has been in his normal state of health recently. He came to his parent's house side door when he noted his face was twitching and when he grabbed the backdoor handle his hand would not let go. The next thing he remembers is being in a chair in his room. Mother came home a few hours later and after watching the security camera footage noted that he had fallen and was shaking all over. I was able to review the footage which begins around 12:30pm today. Patient began having a seizure when he was trying to get into the door, began with R arm and then spread to entire body, he fell sideways onto the door jam and then slowly lowered onto a cardboard box and then to the ground. Seizure lasted for 30-60 seconds and then stopped. Patient was on the ground for about 5minutes with sonorous respirations before he then tried to get up, staggered around for a few seconds before getting into a chair on the porch. At some point later on he managed to get into the house and then into his room but he does not remember any of those events. He is back to baseline now, has a mild headache. He has been feeling well in recent days, no headaches, lack of sleep. Has been eating and drinking well without nausea, vomiting or diarrhea. Denies any alcohol or drug use. No prior history of seizures.     Past Medical History:  Diagnosis Date  . Bulging lumbar disc     Past Surgical History:  Procedure Laterality Date  . TONSILLECTOMY    . WISDOM TOOTH EXTRACTION      History reviewed. No pertinent family history.  Social History   Tobacco Use  . Smoking status: Never Smoker  . Smokeless tobacco: Never Used  Substance Use Topics  . Alcohol use: No     Alcohol/week: 0.0 standard drinks  . Drug use: No     Home Medications Prior to Admission medications   Medication Sig Start Date End Date Taking? Authorizing Provider  amoxicillin (AMOXIL) 500 MG capsule Take 500 mg by mouth 3 (three) times daily. 10/28/19   [provider]     Allergies    Patient has no known allergies.   Review of Systems   Review of Systems A comprehensive review of systems was completed and negative except as noted in HPI.    Physical Exam BP 119/65   Pulse 76   Temp 98.4 F (36.9 C) (Oral)   Resp 19   Ht 6\' 3"  (1.905 m)   Wt 97.5 kg   SpO2 96%   BMI 26.87 kg/m   Physical Exam Vitals and nursing note reviewed.  Constitutional:      Appearance: Normal appearance.  HENT:     Head: Normocephalic and atraumatic.     Nose: Nose normal.     Mouth/Throat:     Mouth: Mucous membranes are moist.  Eyes:     Extraocular Movements: Extraocular movements intact.     Conjunctiva/sclera: Conjunctivae normal.     Pupils: Pupils are equal, round, and reactive to light.  Cardiovascular:     Rate and Rhythm: Normal rate.  Pulmonary:     Effort:  Pulmonary effort is normal.     Breath sounds: Normal breath sounds.  Abdominal:     General: Abdomen is flat.     Palpations: Abdomen is soft.     Tenderness: There is no abdominal tenderness.  Musculoskeletal:        General: No swelling. Normal range of motion.     Cervical back: Neck supple.  Skin:    General: Skin is warm and dry.  Neurological:     General: No focal deficit present.     Mental Status: He is alert. Mental status is at baseline.     Cranial Nerves: No cranial nerve deficit.     Sensory: Sensory deficit present.     Motor: No weakness.  Psychiatric:        Mood and Affect: Mood normal.      ED Results / Procedures / Treatments   Labs (all labs ordered are listed, but only abnormal results are displayed) Labs Reviewed  COMPREHENSIVE METABOLIC PANEL - Abnormal; Notable  for the following components:      Result Value   Glucose, Bld 102 (*)    Albumin 5.1 (*)    All other components within normal limits  CBC WITH DIFFERENTIAL/PLATELET - Abnormal; Notable for the following components:   WBC 12.5 (*)    Neutro Abs 9.7 (*)    All other components within normal limits  URINALYSIS, ROUTINE W REFLEX MICROSCOPIC - Abnormal; Notable for the following components:   Color, Urine STRAW (*)    Hgb urine dipstick SMALL (*)    All other components within normal limits  RESP PANEL BY RT-PCR (FLU A&B, COVID) ARPGX2  RAPID URINE DRUG SCREEN, HOSP PERFORMED  URINALYSIS, MICROSCOPIC (REFLEX)  CK  ETHANOL    EKG EKG Interpretation  Date/Time:  Friday April 02 2020 18:05:02 EST Ventricular Rate:  82 PR Interval:    QRS Duration: 92 QT Interval:  345 QTC Calculation: 403 R Axis:   82 Text Interpretation: Sinus rhythm Baseline wander in lead(s) V2 Normal ECG No old tracing to compare Confirmed by Calvert Cantor [id number]) on 04/02/2020 6:13:06 PM    Radiology CT Head Wo Contrast  Result Date: 04/02/2020 CLINICAL DATA:  Seizure, nontraumatic (Age 26-40y) EXAM: CT HEAD WITHOUT CONTRAST TECHNIQUE: Contiguous axial images were obtained from the base of the skull through the vertex without intravenous contrast. COMPARISON:  None. FINDINGS: Brain: Ill-defined area of cortical and subcortical low-density involving the left frontal lobe, for example series 2, image 22. The may be mild surrounding edema. Low-density extends to the frontal horn of the left lateral ventricle. No hemorrhage. Trace left right midline shift. Normal ventricular size without hydrocephalus. Basilar cisterns are patent. Vascular: No hyperdense vessel. Skull: No fracture or focal lesion. Sinuses/Orbits: Minimal opacification of left ethmoid air cell. Otherwise clear paranasal sinuses. Mastoid air cells are well aerated. Included orbits are unremarkable. Other: None. IMPRESSION: 1. Ill-defined area of  cortical and subcortical low-density involving the left frontal lobe with questionable mild surrounding edema and trace left-to-right midline shift. Favor neoplasm, with differential of infection or infarct. Recommend further evaluation with MRI, without and with IV contrast. 2. No hemorrhage or midline shift. These results were called by telephone at the time of interpretation on 04/02/2020 at 6:29 pm to provider [facility] , who verbally acknowledged these results. Electronically Signed   By: Keith Rake M.D.   On: 04/02/2020 18:29    Procedures Procedures  Medications Ordered in the ED Medications  acetaminophen (TYLENOL) tablet  1,000 mg (has no administration in time range)  sodium chloride 0.9 % bolus 1,000 mL (0 mLs Intravenous Stopped 04/02/20 2005)  levETIRAcetam (KEPPRA) IVPB 1000 mg/100 mL premix (0 mg Intravenous Stopped 04/02/20 1933)  gadobutrol (GADAVIST) 1 MMOL/ML injection 10 mL (10 mLs Intravenous Contrast Given 04/02/20 2235)     MDM Rules/Calculators/A&P MDM Patient with new onset seizure, witnessed on home security camera. Back to baseline now, will check labs and CT head. Continue to observe in the ED.   ED Course  I have reviewed the triage vital signs and the nursing notes.  Pertinent labs & imaging results that were available during my care of the patient were reviewed by me and considered in my medical decision making (see chart for details).  Clinical Course as of 04/02/20 2331  Fri Apr 02, 2020  1830 UA and UDS are neg. Reviewed CT images with radiologist, he has a L frontal lobe hypodensity concerning for mass. Reviewed the images with patient and mother at bedside. Spoke with Neurology, Dr. Curly Shores, who recommended patient come to the [facility] ED for an MRI then neurology and neurosurgery consults. She recommends starting Keppra (she initially recommended 750mg  however 1000mg  doses is all that we have available here). Dr. Vanita Panda, EDP at [facility] is aware of  patient.  [CS]  2007 CBC with mild leukocytosis, likely due to seizures [CS]  2020 CK and CMP are unremarkable. Covid is neg.  [CS]    Clinical Course User Index [CS] Truddie Hidden, MD    Final Clinical Impression(s) / ED Diagnoses Final diagnoses:  None    Rx / DC Orders ED Discharge Orders    None       Truddie Hidden, MD 04/02/20 2332

## 2020-04-02 NOTE — ED Triage Notes (Signed)
EMS reports pt was transferred in order to have an MRI completed. Pt had a seizure today, has no history of seizures, a mass was seen on CT scan. Given Keppra  1000mg  IV and 1L NS. BP - 134/67, HR 78, O2 sat 98% RA.

## 2020-04-02 NOTE — ED Triage Notes (Signed)
States was coming out of door at home, states his face began twitching, grab door handle, then was told he fell and hit his head and had what appeared to be a seizure.

## 2020-04-02 NOTE — ED Notes (Signed)
Brought to exam room 12 upon arrival, brought to room via wc, immediately obtained IV, neuro assessment obtained, placed on cont cardiac monitor, vs obtained.

## 2020-04-02 NOTE — ED Notes (Signed)
Phone Handoff Report given to Camera operator at [facility]

## 2020-04-02 NOTE — ED Provider Notes (Signed)
9:50 PM Patient seen on arrival from our affiliated facility.  I discussed the patient's case with our neurology colleagues, given his presentation for seizure, possible new brain mass versus infection, he has received initial Keppra loading at our other facility, will require admission for ongoing monitoring, management.  He and his mother are aware of the plan.  He has no new complaints, does have some mild ongoing headache.  Vital signs unremarkable. Neurology will consult in the ED. MR eye, with and without contrast ordered, pending.   Carmin Muskrat, MD 04/02/20 2151

## 2020-04-02 NOTE — ED Notes (Signed)
Mother has video of episode, appears to have very unstable gait, reaching for door and began falling backwards, rt arm appeared to very stiff with rt hand appears to be cramp like movement, then left arm then noted to do the same process, Hit head on cardboard box, appears that he did not actually hit head very hard on porch floor. Approx 20 min episode. EDP has reviewed video with pt and mother.

## 2020-04-03 ENCOUNTER — Inpatient Hospital Stay (HOSPITAL_COMMUNITY): Payer: 59

## 2020-04-03 DIAGNOSIS — R569 Unspecified convulsions: Secondary | ICD-10-CM | POA: Diagnosis not present

## 2020-04-03 DIAGNOSIS — G9389 Other specified disorders of brain: Secondary | ICD-10-CM | POA: Diagnosis not present

## 2020-04-03 HISTORY — DX: Unspecified convulsions: R56.9

## 2020-04-03 LAB — ETHANOL: Alcohol, Ethyl (B): <10 mg/dL (ref <10)

## 2020-04-03 LAB — COMPREHENSIVE METABOLIC PANEL
ALT: 15 U/L (ref 0–44)
AST: 21 U/L (ref 15–41)
Albumin: 4.2 g/dL (ref 3.5–5.0)
Alkaline Phosphatase: 43 U/L (ref 38–126)
Anion gap: 5 (ref 5–15)
BUN: 9 mg/dL (ref 6–20)
CO2: 25 mmol/L (ref 22–32)
Calcium: 9.7 mg/dL (ref 8.9–10.3)
Chloride: 107 mmol/L (ref 98–111)
Creatinine, Ser: 0.9 mg/dL (ref 0.61–1.24)
GFR, Estimated: >60 mL/min (ref >60)
Glucose, Bld: 111 mg/dL — ABNORMAL HIGH (ref 70–99)
Potassium: 4.4 mmol/L (ref 3.5–5.1)
Sodium: 137 mmol/L (ref 135–145)
Total Bilirubin: 0.9 mg/dL (ref 0.3–1.2)
Total Protein: 6.9 g/dL (ref 6.5–8.1)

## 2020-04-03 LAB — CBC
HCT: 41.7 % (ref 39.0–52.0)
Hemoglobin: 14.2 g/dL (ref 13.0–17.0)
MCH: 29.8 pg (ref 26.0–34.0)
MCHC: 34.1 g/dL (ref 30.0–36.0)
MCV: 87.4 fL (ref 80.0–100.0)
Platelets: 256 10*3/uL (ref 150–400)
RBC: 4.77 MIL/uL (ref 4.22–5.81)
RDW: 12.6 % (ref 11.5–15.5)
WBC: 7 10*3/uL (ref 4.0–10.5)
nRBC: 0 % (ref 0.0–0.2)

## 2020-04-03 LAB — HIV ANTIBODY (ROUTINE TESTING W REFLEX): HIV Screen 4th Generation wRfx: NONREACTIVE

## 2020-04-03 LAB — MRSA PCR SCREENING: MRSA by PCR: NEGATIVE

## 2020-04-03 MED ORDER — ACETAMINOPHEN 650 MG RE SUPP: 650.0000 mg | Freq: Four times a day (QID) | RECTAL | Status: DC | PRN

## 2020-04-03 MED ORDER — ONDANSETRON HCL 4 MG/2ML IJ SOLN: 4.0000 mg | Freq: Four times a day (QID) | INTRAMUSCULAR | Status: DC | PRN

## 2020-04-03 MED ORDER — DEXAMETHASONE 4 MG PO TABS
4.0000 mg | ORAL_TABLET | Freq: Two times a day (BID) | ORAL | Status: DC
  Administered 2020-04-03 (×3): 4 mg via ORAL
  Filled 2020-04-03 (×3): qty 1

## 2020-04-03 MED ORDER — ACETAMINOPHEN 325 MG PO TABS: 650.0000 mg | ORAL_TABLET | Freq: Four times a day (QID) | ORAL | Status: DC | PRN

## 2020-04-03 MED ORDER — DIAZEPAM 5 MG PO TABS
5.0000 mg | ORAL_TABLET | Freq: Once | ORAL | Status: AC
  Administered 2020-04-03: 5 mg via ORAL
  Filled 2020-04-03: qty 1

## 2020-04-03 MED ORDER — SODIUM CHLORIDE 0.9 % IV SOLN
750.0000 mg | Freq: Two times a day (BID) | INTRAVENOUS | Status: DC
  Administered 2020-04-03 – 2020-04-05 (×5): 750 mg via INTRAVENOUS
  Filled 2020-04-03 (×8): qty 7.5

## 2020-04-03 MED ORDER — SODIUM CHLORIDE 0.45 % IV SOLN: INTRAVENOUS | Status: DC

## 2020-04-03 MED ORDER — ONDANSETRON HCL 4 MG PO TABS: 4.0000 mg | ORAL_TABLET | Freq: Four times a day (QID) | ORAL | Status: DC | PRN

## 2020-04-03 NOTE — Consult Note (Addendum)
Reason for Consult: Seizure left frontal lobe mass Referring Physician: Neurology  Gordon [name] is an 25 y.o. male.  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  Apparently the seizure lasted 2 to 3 minutes and he had a postictal period about 10 minutes.  Currently the patient denies any significant headache although has had chronic headaches and migraines since he was a child.  He denies any nausea vomit denies any numbness tingling his arms or his legs does feel like he felt a little funny before he blacked out from what he remembers.  But could not really describe what why he felt funny.  He denies any travel since 2014.  Past Medical History:  Diagnosis Date  . Bulging lumbar disc     Past Surgical History:  Procedure Laterality Date  . TONSILLECTOMY    . WISDOM TOOTH EXTRACTION      History reviewed. No pertinent family history.  Social History:  reports that he has never smoked. He has never used smokeless tobacco. He reports that he does not drink alcohol and does not use drugs.  Allergies: No Known Allergies  Medications: I have reviewed the patient's current medications.  Results for orders placed or performed during the hospital encounter of 04/02/20 (from the past 48 hour(s))  Comprehensive metabolic panel     Status: Abnormal   Collection Time: 04/02/20  5:30 PM  Result Value Ref Range   Sodium 138 135 - 145 mmol/L   Potassium 3.7 3.5 - 5.1 mmol/L   Chloride 102 98 - 111 mmol/L   CO2 24 22 - 32 mmol/L   Glucose, Bld 102 (H) 70 - 99 mg/dL    Comment: Glucose reference range applies only to samples taken after fasting for at least 8 hours.   BUN 13 6 - 20 mg/dL   Creatinine, Ser 1.04 0.61 - 1.24 mg/dL   Calcium 10.0 8.9 - 10.3 mg/dL   Total Protein 8.0 6.5 - 8.1 g/dL   Albumin 5.1 (H) 3.5 - 5.0  g/dL   AST 22 15 - 41 U/L   ALT 15 0 - 44 U/L   Alkaline Phosphatase 49 38 - 126 U/L   Total Bilirubin 0.3 0.3 - 1.2 mg/dL   GFR, Estimated >60 >60 mL/min    Comment: (NOTE) Calculated using the CKD-EPI Creatinine Equation (2021)    Anion gap 12 5 - 15    Comment: Performed at [facility], [address] [place], [place] [address]  CBC with Differential     Status: Abnormal   Collection Time: 04/02/20  5:30 PM  Result Value Ref Range   WBC 12.5 (H) 4.0 - 10.5 K/uL   RBC 5.17 4.22 - 5.81 MIL/uL   Hemoglobin 15.7 13.0 - 17.0 g/dL   HCT 44.6 39.0 - 52.0 %   MCV 86.3 80.0 - 100.0 fL   MCH 30.4 26.0 - 34.0 pg   MCHC 35.2 30.0 - 36.0 g/dL   RDW 12.6 11.5 - 15.5 %   Platelets 331 150 - 400 K/uL   nRBC 0.0 0.0 - 0.2 %   Neutrophils Relative % 77 %   Neutro Abs 9.7 (H) 1.7 - 7.7 K/uL   Lymphocytes Relative 17 %   Lymphs Abs 2.1 0.7 - 4.0 K/uL   Monocytes Relative 5 %   Monocytes Absolute  0.6 0.1 - 1.0 K/uL   Eosinophils Relative 1 %   Eosinophils Absolute 0.1 0.0 - 0.5 K/uL   Basophils Relative 0 %   Basophils Absolute 0.1 0.0 - 0.1 K/uL   Immature Granulocytes 0 %   Abs Immature Granulocytes 0.04 0.00 - 0.07 K/uL    Comment: Performed at [facility], [address] [place], [place] [address]  CK     Status: None   Collection Time: 04/02/20  5:30 PM  Result Value Ref Range   Total CK 335 49 - 397 U/L    Comment: Performed at [facility], [address] [place], [place] [address]  Urinalysis, Routine w reflex microscopic Urine, Clean Catch     Status: Abnormal   Collection Time: 04/02/20  5:51 PM  Result Value Ref Range   Color, Urine STRAW (A) YELLOW   APPearance CLEAR CLEAR   Specific Gravity, Urine 1.010 1.005 - 1.030   pH 6.0 5.0 - 8.0   Glucose, UA NEGATIVE NEGATIVE mg/dL   Hgb urine dipstick SMALL (A) NEGATIVE   Bilirubin Urine NEGATIVE NEGATIVE   Ketones, ur NEGATIVE NEGATIVE mg/dL   Protein, ur NEGATIVE NEGATIVE mg/dL    Nitrite NEGATIVE NEGATIVE   Leukocytes,Ua NEGATIVE NEGATIVE    Comment: Performed at [facility], [address] [place], [place] [address]  Urine rapid drug screen (hosp performed)     Status: None   Collection Time: 04/02/20  5:51 PM  Result Value Ref Range   Opiates NONE DETECTED NONE DETECTED   Cocaine NONE DETECTED NONE DETECTED   Benzodiazepines NONE DETECTED NONE DETECTED   Amphetamines NONE DETECTED NONE DETECTED   Tetrahydrocannabinol NONE DETECTED NONE DETECTED   Barbiturates NONE DETECTED NONE DETECTED    Comment: (NOTE) DRUG SCREEN FOR MEDICAL PURPOSES ONLY.  IF CONFIRMATION IS NEEDED FOR ANY PURPOSE, NOTIFY LAB WITHIN 5 DAYS.  LOWEST DETECTABLE LIMITS FOR URINE DRUG SCREEN Drug Class                     Cutoff (ng/mL) Amphetamine and metabolites    1000 Barbiturate and metabolites    200 Benzodiazepine                 786 Tricyclics and metabolites     300 Opiates and metabolites        300 Cocaine and metabolites        300 THC                            50 Performed at [facility], [address] [place], [place] [address]   Urinalysis, Microscopic (reflex)     Status: None   Collection Time: 04/02/20  5:51 PM  Result Value Ref Range   RBC / HPF 0-5 0 - 5 RBC/hpf   WBC, UA 0-5 0 - 5 WBC/hpf   Bacteria, UA NONE SEEN NONE SEEN   Squamous Epithelial / LPF 0-5 0 - 5    Comment: Performed at [facility], [address] [place], [place] [address]  Resp Panel by RT-PCR (Flu A&B, Covid) Nasopharyngeal Swab     Status: None   Collection Time: 04/02/20  7:08 PM   Specimen: Nasopharyngeal Swab; Nasopharyngeal(NP) swabs in vial transport medium  Result Value Ref Range   SARS Coronavirus 2 by RT PCR NEGATIVE NEGATIVE    Comment: (NOTE)  SARS-CoV-2 target nucleic acids are NOT DETECTED.  The SARS-CoV-2 RNA is generally detectable in upper respiratory specimens during the acute phase of infection. The lowest concentration of  SARS-CoV-2 viral copies this assay can detect is 138 copies/mL. A negative result does not preclude SARS-Cov-2 infection and should not be used as the sole basis for treatment or other patient management decisions. A negative result may occur with  improper specimen collection/handling, submission of specimen other than nasopharyngeal swab, presence of viral mutation(s) within the areas targeted by this assay, and inadequate number of viral copies(<138 copies/mL). A negative result must be combined with clinical observations, patient history, and epidemiological information. The expected result is Negative.  Fact Sheet for Patients:  EntrepreneurPulse.com.au  Fact Sheet for Healthcare Providers:  IncredibleEmployment.be  This test is no t yet approved or cleared by the [place] FDA and  has been authorized for detection and/or diagnosis of SARS-CoV-2 by FDA under an Emergency Use Authorization (EUA). This EUA will remain  in effect (meaning this test can be used) for the duration of the COVID-19 declaration under Section 564(b)(1) of the Act, 21 U.S.C.section 360bbb-3(b)(1), unless the authorization is terminated  or revoked sooner.       Influenza A by PCR NEGATIVE NEGATIVE   Influenza B by PCR NEGATIVE NEGATIVE    Comment: (NOTE) The Xpert Xpress SARS-CoV-2/FLU/RSV plus assay is intended as an aid in the diagnosis of influenza from Nasopharyngeal swab specimens and should not be used as a sole basis for treatment. Nasal washings and aspirates are unacceptable for Xpert Xpress SARS-CoV-2/FLU/RSV testing.  Fact Sheet for Patients: EntrepreneurPulse.com.au  Fact Sheet for Healthcare Providers: IncredibleEmployment.be  This test is not yet approved or cleared by the [place] FDA and has been authorized for detection and/or diagnosis of SARS-CoV-2 by FDA under an Emergency Use Authorization (EUA).  This EUA will remain in effect (meaning this test can be used) for the duration of the COVID-19 declaration under Section 564(b)(1) of the Act, 21 U.S.C. section 360bbb-3(b)(1), unless the authorization is terminated or revoked.  Performed at [facility], [address] [place], [place] [address]   MRSA PCR Screening     Status: None   Collection Time: 04/03/20  6:21 AM   Specimen: Nasal Mucosa; Nasopharyngeal  Result Value Ref Range   MRSA by PCR NEGATIVE NEGATIVE    Comment:        The GeneXpert MRSA Assay (FDA approved for NASAL specimens only), is one component of a comprehensive MRSA colonization surveillance program. It is not intended to diagnose MRSA infection nor to guide or monitor treatment for MRSA infections. Performed at [facility], [place] [address], [place], [place] [address]   Ethanol     Status: None   Collection Time: 04/03/20  7:28 AM  Result Value Ref Range   Alcohol, Ethyl (B) <10 <10 mg/dL    Comment: (NOTE) Lowest detectable limit for serum alcohol is 10 mg/dL.  For medical purposes only. Performed at [facility], [place] [address] [place], [place] [address]   HIV Antibody (routine testing w rflx)     Status: None   Collection Time: 04/03/20  7:28 AM  Result Value Ref Range   HIV Screen 4th Generation wRfx Non Reactive Non Reactive    Comment: Performed at [facility], [place] [address], [place], [place] [address]  CBC     Status: None   Collection Time: 04/03/20  7:28 AM  Result Value Ref Range   WBC 7.0 4.0 - 10.5 K/uL   RBC 4.77 4.22 - 5.81 MIL/uL   Hemoglobin 14.2 13.0 - 17.0 g/dL   HCT 41.7 39.0 - 52.0 %   MCV 87.4 80.0 - 100.0 fL   MCH 29.8 26.0 - 34.0 pg   MCHC 34.1 30.0 - 36.0 g/dL   RDW 12.6 11.5 - 15.5 %   Platelets 256 150 - 400 K/uL   nRBC 0.0 0.0 - 0.2 %    Comment: Performed at [facility], [place] [address], [place], [place] [address]  Comprehensive metabolic panel     Status: Abnormal    Collection Time: 04/03/20  7:28 AM  Result Value Ref Range   Sodium 137 135 - 145 mmol/L   Potassium 4.4 3.5 - 5.1 mmol/L   Chloride 107 98 - 111 mmol/L   CO2 25 22 - 32 mmol/L   Glucose, Bld 111 (H) 70 - 99 mg/dL    Comment: Glucose reference range applies only to samples taken after fasting for at least 8 hours.   BUN 9 6 - 20 mg/dL   Creatinine, Ser 0.90 0.61 - 1.24 mg/dL   Calcium 9.7 8.9 - 10.3 mg/dL   Total Protein 6.9 6.5 - 8.1 g/dL   Albumin 4.2 3.5 - 5.0 g/dL   AST 21 15 - 41 U/L   ALT 15 0 - 44 U/L   Alkaline Phosphatase 43 38 - 126 U/L   Total Bilirubin 0.9 0.3 - 1.2 mg/dL   GFR, Estimated >60 >60 mL/min    Comment: (NOTE) Calculated using the CKD-EPI Creatinine Equation (2021)    Anion gap 5 5 - 15    Comment: Performed at [facility] [address], [place], [place] [address]    CT Head Wo Contrast  Result Date: 04/02/2020 CLINICAL DATA:  Seizure, nontraumatic (Age 64-40y) EXAM: CT HEAD WITHOUT CONTRAST TECHNIQUE: Contiguous axial images were obtained from the base of the skull through the vertex without intravenous contrast. COMPARISON:  None. FINDINGS: Brain: Ill-defined area of cortical and subcortical low-density involving the left frontal lobe, for example series 2, image 22. The may be mild surrounding edema. Low-density extends to the frontal horn of the left lateral ventricle. No hemorrhage. Trace left right midline shift. Normal ventricular size without hydrocephalus. Basilar cisterns are patent. Vascular: No hyperdense vessel. Skull: No fracture or focal lesion. Sinuses/Orbits: Minimal opacification of left ethmoid air cell. Otherwise clear paranasal sinuses. Mastoid air cells are well aerated. Included orbits are unremarkable. Other: None. IMPRESSION: 1. Ill-defined area of cortical and subcortical low-density involving the left frontal lobe with questionable mild surrounding edema and trace left-to-right midline shift. Favor neoplasm, with differential of  infection or infarct. Recommend further evaluation with MRI, without and with IV contrast. 2. No hemorrhage or midline shift. These results were called by telephone at the time of interpretation on 04/02/2020 at 6:29 pm to provider [facility] , who verbally acknowledged these results. Electronically Signed   By: Keith Rake M.D.   On: 04/02/2020 18:29   MR Brain W and Wo Contrast  Result Date: 04/02/2020 CLINICAL DATA:  Seizure EXAM: MRI HEAD WITHOUT AND WITH CONTRAST TECHNIQUE: Multiplanar, multiecho pulse sequences of the brain and surrounding structures were obtained without and with intravenous contrast. CONTRAST:  21mL GADAVIST GADOBUTROL 1 MMOL/ML IV SOLN COMPARISON:  Head CT 04/02/2020 FINDINGS: Brain: There is a large area of abnormal signal intensity in the left frontal lobe. At the  central, superior aspect of this region is a contrast-enhancing nodule that measures approximately 11 x 8 mm. There are other areas of faint contrast enhancement more inferiorly. No hemorrhage. Hyperintense T2-weighted signal fills most of the anterior left frontal white matter. Otherwise, white matter signal is normal. There is rightward bulging of the falx cerebri without subfalcine herniation. No hydrocephalus. The hippocampi are normal and symmetric in size and signal. The hypothalamus and mamillary bodies are normal. Vascular: Major flow voids are preserved. Skull and upper cervical spine: Normal calvarium and skull base. Visualized upper cervical spine and soft tissues are normal. Sinuses/Orbits:No paranasal sinus fluid levels or advanced mucosal thickening. No mastoid or middle ear effusion. Normal orbits. IMPRESSION: 1. Left frontal mass with multiple areas of faint nodular contrast enhancement and large area of surrounding abnormal signal intensity, most consistent with anaplastic astrocytoma. Electronically Signed   By: Ulyses Jarred M.D.   On: 04/02/2020 23:34   EEG adult  Result Date:  04/03/2020 Lora Havens, MD     04/03/2020 10:37 AM Patient Name: Gordon [name] MRN: [id number] Epilepsy Attending: Lora Havens Referring Physician/Provider: Dr Kerney Elbe Date: 04/03/2020 Duration: 30.22 mins Patient history: 25 year old male with first time seizure. EEG to evaluate for seizure Level of alertness: Awake AEDs during EEG study: LEV Technical aspects: This EEG study was done with scalp electrodes positioned according to the 10-20 International system of electrode placement. Electrical activity was acquired at a sampling rate of 500Hz  and reviewed with a high frequency filter of 70Hz  and a low frequency filter of 1Hz . EEG data were recorded continuously and digitally stored. Description: The posterior dominant rhythm consists of 9-10 Hz activity of moderate voltage (25-35 uV) seen predominantly in posterior head regions, symmetric and reactive to eye opening and eye closing. EEG showed intermittent let frontotemporal 3 to 6 Hz theta-delta slowing. Sharp waves were also seen in left frontotemporal region. Hyperventilation did not show any EEG change.  Physiologic photic driving was seen during photic stimulation.  ABNORMALITY - Sharp waves, left frontotemporal region -Intermittent slow, left frontotemporal region IMPRESSION: This study showed evidence of potential epileptogenicity and cortical dysfunction arising from left frontotemporal region likely secondary to underlying tumor. No seizureswere seen throughout the recording. Lora Havens    Review of Systems  Neurological: Positive for seizures and headaches.   Blood pressure 125/65, pulse 73, temperature 98.5 F (36.9 C), temperature source Oral, resp. rate 12, height 6\' 3"  (1.905 m), weight 97.5 kg, SpO2 99 %. Physical Exam Neurological:     General: No focal deficit present.     Mental Status: He is alert and oriented to person, place, and time.     Comments: Patient is awake and alert pupils are equal extract  movements are intact strength is 5-5 upper and lower extremities neurologic Sam otherwise intact     Assessment/Plan: 25 year old with new onset seizure and imaging shows a left frontal lobe lesion with some areas of enhancement certainly this is concerning for some malignancy although difficult to know what stage I grade in a 26 year old.  I have discussed this with Dr. Venetia Constable who will evaluate the films will discuss with the patient and family and plan on surgery beginning of this week.  Agreed continue Keppra continue steroids  Elaina Hoops 04/03/2020, 1:53 PM

## 2020-04-03 NOTE — ED Notes (Signed)
Mother demands pads be put on the bed rails immediately

## 2020-04-03 NOTE — Progress Notes (Signed)
Patient arrived to unit and walked to bed, VSS, CHG bath completed, MRSA swab completed, belongings included clothing, shoes and cell phone at bedside.  Side rails padded, call bell within reach, patient in no pain at this time.

## 2020-04-03 NOTE — Progress Notes (Signed)
Taking over for Dr. Saintclair Halsted. 25yo w/ new onset GTC w/ BUE tonic movement RUE then LUE, then arms crossed, c/w 'figure of four' movement then T-C activity. CT/MRI showed L F mass measuring 3.3x5.7cm, mainly involving the left superior frontal gyrus but extending into the subcortical white matter down to the anterior corpus callosum / periventricular white matter, one enhancing nodule in the superficial center of the mass but there is also patchy enhancement throughout most of the mass. Pt writes with his left hand but is ambidextrous.   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 Discussed w/ the patient and his parents at length. Given the enhancement and seizure, I think this symptomatic lesion should be removed. Will send a frozen in the OR before continuing resection but I think it's highly unlikely that path will result in a lesion that does not require resection. We discussed the significant potential for a left SMA syndrome along with the usual risks of surgery.  -will take to the OR tomorrow for resection, NPO p MN

## 2020-04-03 NOTE — Progress Notes (Signed)
EEG complete - results pending 

## 2020-04-03 NOTE — Consult Note (Signed)
NEURO HOSPITALIST CONSULT NOTE   Requestig physician: Dr. Jonelle Sidle  Reason for Consult: New onset seizure  History obtained from:  Patient, Mother and Chart     HPI:                                                                                                                                          Gordon [name] is an 25 y.o. male who presented to the ED yesterday after a first time seizure at home. The seizure was caught on video using a Ring home security system. The video was viewed by the Neurology attending with seizure semiology as follows: Sudden onset of tonic posturing of upper extremities beginning with the right arm, extension of neck and apparent LOC followed by falling backwards into a cardboard box, then crossing over of arms, one of which is flexed and the other extended, then clonic activity. The seizure lasted for about 2 minutes. The patient recalls that his face began twitching prior to the GTC seizure and when he grabbed a door handle his hand would not leg go; these are the last things he remembers; the patient has no memory of the subsequent portions of the seizure which was recorded on video. Per the video recording, the patient was on the ground for about 10 minutes with sonorous respirations before he stood up, then staggered around for a few seconds before getting into a chair on the porch. He then is seen entering the house. The first thing he remembers following the seizure is being in a chair in his room. He sustained abrasions due to the fall and seizure. No EtOH or drug use. No sleep deprivation or headaches. Has had good po intake at home.    Past Medical History:  Diagnosis Date  . Bulging lumbar disc     Past Surgical History:  Procedure Laterality Date  . TONSILLECTOMY    . WISDOM TOOTH EXTRACTION      History reviewed. No pertinent family history.           Social History:  reports that he has never smoked. He has never used  smokeless tobacco. He reports that he does not drink alcohol and does not use drugs.  No Known Allergies  MEDICATIONS:  No outpatient medications listed in Epic  ROS:                                                                                                                                       As per HPI. Comprehensive ROS otherwise negative.    Blood pressure 133/75, pulse 71, temperature 97.8 F (36.6 C), temperature source Oral, resp. rate (!) 9, height 6\' 3"  (1.905 m), weight 97.5 kg, SpO2 97 %.   General Examination:                                                                                                       Physical Exam  HEENT-  [place]/AT   Lungs- Respirations unlabored Extremities- No edema  Neurological Examination Mental Status: Awake and alert. Oriented x 5. Good insight. Thought content appropriate.  Speech fluent with intact naming and comprehension.  Cranial Nerves: II: Visual fields intact with no extinction to DSS. PERRL.   III,IV, VI: No ptosis. EOMI. No nystagmus.  V,VII: Smile symmetric, facial temp sensation equal bilaterally VIII: Hearing intact to voice IX,X: No hypophonia XI: Slight lag on the right with shoulder shrug XII: Midline tongue extension Motor: Right : Upper extremity   5/5    Left:     Upper extremity   5/5  Lower extremity   5/5     Lower extremity   5/5 No pronator drift.  Orbiting fingers test is negative Sensory: Mildly decreased temp sensation to RUE. FT intact x 4. No extinction to DSS.  Deep Tendon Reflexes:  2+ right brachioradialis 3+ left brachioradialis 2+ bilateral patellae 2+ bilateral achilles Plantars: Right: downgoing  Left: downgoing Cerebellar: No ataxia with FNF bilaterally  Gait: Deferred   Lab Results: Basic Metabolic Panel: Recent Labs  Lab 04/02/20 1730  NA 138  K 3.7  CL 102  CO2  24  GLUCOSE 102*  BUN 13  CREATININE 1.04  CALCIUM 10.0    CBC: Recent Labs  Lab 04/02/20 1730  WBC 12.5*  NEUTROABS 9.7*  HGB 15.7  HCT 44.6  MCV 86.3  PLT 331    Cardiac Enzymes: Recent Labs  Lab 04/02/20 1730  CKTOTAL 335    Lipid Panel: No results for input(s): CHOL, TRIG, HDL, CHOLHDL, VLDL, LDLCALC in the last 168 hours.  Imaging: CT Head Wo Contrast  Result Date: 04/02/2020 CLINICAL DATA:  Seizure, nontraumatic (Age 59-40y) EXAM: CT HEAD WITHOUT CONTRAST TECHNIQUE: Contiguous axial images were obtained from the base of  the skull through the vertex without intravenous contrast. COMPARISON:  None. FINDINGS: Brain: Ill-defined area of cortical and subcortical low-density involving the left frontal lobe, for example series 2, image 22. The may be mild surrounding edema. Low-density extends to the frontal horn of the left lateral ventricle. No hemorrhage. Trace left right midline shift. Normal ventricular size without hydrocephalus. Basilar cisterns are patent. Vascular: No hyperdense vessel. Skull: No fracture or focal lesion. Sinuses/Orbits: Minimal opacification of left ethmoid air cell. Otherwise clear paranasal sinuses. Mastoid air cells are well aerated. Included orbits are unremarkable. Other: None. IMPRESSION: 1. Ill-defined area of cortical and subcortical low-density involving the left frontal lobe with questionable mild surrounding edema and trace left-to-right midline shift. Favor neoplasm, with differential of infection or infarct. Recommend further evaluation with MRI, without and with IV contrast. 2. No hemorrhage or midline shift. These results were called by telephone at the time of interpretation on 04/02/2020 at 6:29 pm to provider [facility] , who verbally acknowledged these results. Electronically Signed   By: Keith Rake M.D.   On: 04/02/2020 18:29   MR Brain W and Wo Contrast  Result Date: 04/02/2020 CLINICAL DATA:  Seizure EXAM: MRI HEAD WITHOUT  AND WITH CONTRAST TECHNIQUE: Multiplanar, multiecho pulse sequences of the brain and surrounding structures were obtained without and with intravenous contrast. CONTRAST:  68mL GADAVIST GADOBUTROL 1 MMOL/ML IV SOLN COMPARISON:  Head CT 04/02/2020 FINDINGS: Brain: There is a large area of abnormal signal intensity in the left frontal lobe. At the central, superior aspect of this region is a contrast-enhancing nodule that measures approximately 11 x 8 mm. There are other areas of faint contrast enhancement more inferiorly. No hemorrhage. Hyperintense T2-weighted signal fills most of the anterior left frontal white matter. Otherwise, white matter signal is normal. There is rightward bulging of the falx cerebri without subfalcine herniation. No hydrocephalus. The hippocampi are normal and symmetric in size and signal. The hypothalamus and mamillary bodies are normal. Vascular: Major flow voids are preserved. Skull and upper cervical spine: Normal calvarium and skull base. Visualized upper cervical spine and soft tissues are normal. Sinuses/Orbits:No paranasal sinus fluid levels or advanced mucosal thickening. No mastoid or middle ear effusion. Normal orbits. IMPRESSION: 1. Left frontal mass with multiple areas of faint nodular contrast enhancement and large area of surrounding abnormal signal intensity, most consistent with anaplastic astrocytoma. Electronically Signed   By: Ulyses Jarred M.D.   On: 04/02/2020 23:34    Assessment: 26 year old male with first time seizure. MRI reveals an anterior left frontal lobe enhancing lesion with surrounding edema appearing most consistent with anaplastic astrocytoma 1. Exam reveals subtle right sided motor and sensory findings. No clinical seizure activity noted.  2. Was loaded with Keppra 1000 mg IV at OSH.  3. MRI brain with and without contrast: There is a large area of abnormal signal intensity in the left frontal lobe. At the central, superior aspect of this region is a  contrast-enhancing nodule that measures approximately 11 x 8 mm. There are other areas of faint contrast enhancement more inferiorly. No hemorrhage. Hyperintense T2-weighted signal fills most of the anterior left frontal white matter. There is rightward bulging of the falx cerebri without subfalcine herniation. Imaging findings are most consistent with anaplastic astrocytoma, but other differentials should also be considered.   Recommendations: 1. EEG in AM 2. Inpatient seizure precautions 3. Continue Keppra at 750 mg IV BID 4. Ativan 2 mg IV if seizures recur and call Neurology.  5. Neurosurgery  consult in the AM 6. Contact Dr. Shary Key of Neurooncology in the AM for expedited outpatient appointment or inpatient visit.  7. Per [facility] statutes, patients with seizures are not allowed to drive until  they have been seizure-free for six months. Use caution when using heavy equipment or power tools. Avoid working on ladders or at heights. Take showers instead of baths. Ensure the water temperature is not too high on the home water heater. Do not go swimming alone. When caring for infants or small children, sit down when holding, feeding, or changing them to minimize risk of injury to the child in the event you have a seizure. Also, Maintain good sleep hygiene. Avoid alcohol. 8. Extensive education regarding the imaging findings provided to the patient and his mother, as well as his father over the telephone. Discussion included differential diagnosis of the mass lesion, need for inpatient assessments including Oncology and Neurosurgery, need for Neuro-Oncology input and need for biopsy. The patient, his father and his mother expressed understanding and agreement with the plan. All questions answered.   Electronically signed: Dr. Kerney Elbe 04/03/2020, 12:44 AM

## 2020-04-03 NOTE — Progress Notes (Signed)
PROGRESS NOTE    Gordon [name]  ESP:[id number] DOB: 02-08-1995 DOA: 04/02/2020 PCP: Chesley Noon, MD    Brief Narrative:  64FH with no significant PMH presented with new seizures. Work up confirmed a large L frontal mass. Pt was admitted for further work up  Assessment & Plan:   Principal Problem:   Seizure [facility]) Active Problems:   Brain mass  #1 new onset seizure:  -Likely secondary to brain mass.   -Currently stable and on decadron and keppra -EEG performed and reviewed. Findings of potential epileptogenicity and cortical dysfunction arising from the L frontotemporal region. 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   -On decadron and keppra -Neurology following -Consulted Neurosurgery, appreciate input. Plan for surgery beginning of this week  DVT prophylaxis: SCD's Code Status: Full Family Communication: Pt in room, family is at bedside  Status is: Inpatient  Remains inpatient appropriate because:Ongoing diagnostic testing needed not appropriate for outpatient work up and Inpatient level of care appropriate due to severity of illness   Dispo: The patient is from: Home              Anticipated d/c is to: Home              Patient currently is not medically stable to d/c.   Difficult to place patient No   Consultants:   Neurology  Neurosurgery  Procedures:     Antimicrobials: Anti-infectives (From admission, onward)   None       Subjective: Without complaints at this time  Objective: Vitals:   04/03/20 0530 04/03/20 0558 04/03/20 0711 04/03/20 1115  BP: 104/60 114/65 (!) 102/57 125/65  Pulse: 63 67 74 73  Resp: 17 19  12   Temp: 98 F (36.7 C) (!) 97.5 F (36.4 C) 98.3 F (36.8 C) 98.5 F (36.9 C)  TempSrc: Oral Oral Axillary Oral  SpO2: 97% 100% 100% 99%  Weight:      Height:        Intake/Output Summary (Last 24 hours) at 04/03/2020 1601 Last data filed at  04/03/2020 5456 Gross per 24 hour  Intake 1155.48 ml  Output 120 ml  Net 1035.48 ml   Filed Weights   04/02/20 1721  Weight: 97.5 kg    Examination: General exam: Awake, laying in bed, in nad Respiratory system: Normal respiratory effort, no wheezing Cardiovascular system: regular rate, s1, s2 Gastrointestinal system: Soft, nondistended, positive BS Central nervous system: CN2-12 grossly intact, strength intact Extremities: Perfused, no clubbing Skin: Normal skin turgor, no notable skin lesions seen Psychiatry: Mood normal // no visual hallucinations   Data Reviewed: I have personally reviewed following labs and imaging studies  CBC: Recent Labs  Lab 04/02/20 1730 04/03/20 0728  WBC 12.5* 7.0  NEUTROABS 9.7*  --   HGB 15.7 14.2  HCT 44.6 41.7  MCV 86.3 87.4  PLT 331 256   Basic Metabolic Panel: Recent Labs  Lab 04/02/20 1730 04/03/20 0728  NA 138 137  K 3.7 4.4  CL 102 107  CO2 24 25  GLUCOSE 102* 111*  BUN 13 9  CREATININE 1.04 0.90  CALCIUM 10.0 9.7   GFR: Estimated Creatinine Clearance: 152.6 mL/min (by C-G formula based on SCr of 0.9 mg/dL). Liver Function Tests: Recent Labs  Lab 04/02/20 1730 04/03/20 0728  AST 22 21  ALT 15 15  ALKPHOS 49 43  BILITOT 0.3 0.9  PROT 8.0 6.9  ALBUMIN 5.1* 4.2   No  results for input(s): LIPASE, AMYLASE in the last 168 hours. No results for input(s): AMMONIA in the last 168 hours. Coagulation Profile: No results for input(s): INR, PROTIME in the last 168 hours. Cardiac Enzymes: Recent Labs  Lab 04/02/20 1730  CKTOTAL 335   BNP (last 3 results) No results for input(s): PROBNP in the last 8760 hours. HbA1C: No results for input(s): HGBA1C in the last 72 hours. CBG: No results for input(s): GLUCAP in the last 168 hours. Lipid Profile: No results for input(s): CHOL, HDL, LDLCALC, TRIG, CHOLHDL, LDLDIRECT in the last 72 hours. Thyroid Function Tests: No results for input(s): TSH, T4TOTAL, FREET4, T3FREE,  THYROIDAB in the last 72 hours. Anemia Panel: No results for input(s): VITAMINB12, FOLATE, FERRITIN, TIBC, IRON, RETICCTPCT in the last 72 hours. Sepsis Labs: No results for input(s): PROCALCITON, LATICACIDVEN in the last 168 hours.  Recent Results (from the past 240 hour(s))  Resp Panel by RT-PCR (Flu A&B, Covid) Nasopharyngeal Swab     Status: None   Collection Time: 04/02/20  7:08 PM   Specimen: Nasopharyngeal Swab; Nasopharyngeal(NP) swabs in vial transport medium  Result Value Ref Range Status   SARS Coronavirus 2 by RT PCR NEGATIVE NEGATIVE Final    Comment: (NOTE) SARS-CoV-2 target nucleic acids are NOT DETECTED.  The SARS-CoV-2 RNA is generally detectable in upper respiratory specimens during the acute phase of infection. The lowest concentration of SARS-CoV-2 viral copies this assay can detect is 138 copies/mL. A negative result does not preclude SARS-Cov-2 infection and should not be used as the sole basis for treatment or other patient management decisions. A negative result may occur with  improper specimen collection/handling, submission of specimen other than nasopharyngeal swab, presence of viral mutation(s) within the areas targeted by this assay, and inadequate number of viral copies(<138 copies/mL). A negative result must be combined with clinical observations, patient history, and epidemiological information. The expected result is Negative.  Fact Sheet for Patients:  EntrepreneurPulse.com.au  Fact Sheet for Healthcare Providers:  IncredibleEmployment.be  This test is no t yet approved or cleared by the [place] FDA and  has been authorized for detection and/or diagnosis of SARS-CoV-2 by FDA under an Emergency Use Authorization (EUA). This EUA will remain  in effect (meaning this test can be used) for the duration of the COVID-19 declaration under Section 564(b)(1) of the Act, 21 U.S.C.section 360bbb-3(b)(1), unless the  authorization is terminated  or revoked sooner.       Influenza A by PCR NEGATIVE NEGATIVE Final   Influenza B by PCR NEGATIVE NEGATIVE Final    Comment: (NOTE) The Xpert Xpress SARS-CoV-2/FLU/RSV plus assay is intended as an aid in the diagnosis of influenza from Nasopharyngeal swab specimens and should not be used as a sole basis for treatment. Nasal washings and aspirates are unacceptable for Xpert Xpress SARS-CoV-2/FLU/RSV testing.  Fact Sheet for Patients: EntrepreneurPulse.com.au  Fact Sheet for Healthcare Providers: IncredibleEmployment.be  This test is not yet approved or cleared by the [place] FDA and has been authorized for detection and/or diagnosis of SARS-CoV-2 by FDA under an Emergency Use Authorization (EUA). This EUA will remain in effect (meaning this test can be used) for the duration of the COVID-19 declaration under Section 564(b)(1) of the Act, 21 U.S.C. section 360bbb-3(b)(1), unless the authorization is terminated or revoked.  Performed at [facility], [address] [place], [place] [address]   MRSA PCR Screening     Status: None   Collection Time: 04/03/20  6:21 AM  Specimen: Nasal Mucosa; Nasopharyngeal  Result Value Ref Range Status   MRSA by PCR NEGATIVE NEGATIVE Final    Comment:        The GeneXpert MRSA Assay (FDA approved for NASAL specimens only), is one component of a comprehensive MRSA colonization surveillance program. It is not intended to diagnose MRSA infection nor to guide or monitor treatment for MRSA infections. Performed at [facility], [place] [address] [place], [place] [address]      Radiology Studies: CT Head Wo Contrast  Result Date: 04/02/2020 CLINICAL DATA:  Seizure, nontraumatic (Age 83-40y) EXAM: CT HEAD WITHOUT CONTRAST TECHNIQUE: Contiguous axial images were obtained from the base of the skull through the vertex without intravenous contrast. COMPARISON:   None. FINDINGS: Brain: Ill-defined area of cortical and subcortical low-density involving the left frontal lobe, for example series 2, image 22. The may be mild surrounding edema. Low-density extends to the frontal horn of the left lateral ventricle. No hemorrhage. Trace left right midline shift. Normal ventricular size without hydrocephalus. Basilar cisterns are patent. Vascular: No hyperdense vessel. Skull: No fracture or focal lesion. Sinuses/Orbits: Minimal opacification of left ethmoid air cell. Otherwise clear paranasal sinuses. Mastoid air cells are well aerated. Included orbits are unremarkable. Other: None. IMPRESSION: 1. Ill-defined area of cortical and subcortical low-density involving the left frontal lobe with questionable mild surrounding edema and trace left-to-right midline shift. Favor neoplasm, with differential of infection or infarct. Recommend further evaluation with MRI, without and with IV contrast. 2. No hemorrhage or midline shift. These results were called by telephone at the time of interpretation on 04/02/2020 at 6:29 pm to provider [facility] , who verbally acknowledged these results. Electronically Signed   By: Keith Rake M.D.   On: 04/02/2020 18:29   MR Brain W and Wo Contrast  Result Date: 04/02/2020 CLINICAL DATA:  Seizure EXAM: MRI HEAD WITHOUT AND WITH CONTRAST TECHNIQUE: Multiplanar, multiecho pulse sequences of the brain and surrounding structures were obtained without and with intravenous contrast. CONTRAST:  22mL GADAVIST GADOBUTROL 1 MMOL/ML IV SOLN COMPARISON:  Head CT 04/02/2020 FINDINGS: Brain: There is a large area of abnormal signal intensity in the left frontal lobe. At the central, superior aspect of this region is a contrast-enhancing nodule that measures approximately 11 x 8 mm. There are other areas of faint contrast enhancement more inferiorly. No hemorrhage. Hyperintense T2-weighted signal fills most of the anterior left frontal white matter.  Otherwise, white matter signal is normal. There is rightward bulging of the falx cerebri without subfalcine herniation. No hydrocephalus. The hippocampi are normal and symmetric in size and signal. The hypothalamus and mamillary bodies are normal. Vascular: Major flow voids are preserved. Skull and upper cervical spine: Normal calvarium and skull base. Visualized upper cervical spine and soft tissues are normal. Sinuses/Orbits:No paranasal sinus fluid levels or advanced mucosal thickening. No mastoid or middle ear effusion. Normal orbits. IMPRESSION: 1. Left frontal mass with multiple areas of faint nodular contrast enhancement and large area of surrounding abnormal signal intensity, most consistent with anaplastic astrocytoma. Electronically Signed   By: Ulyses Jarred M.D.   On: 04/02/2020 23:34   EEG adult  Result Date: 04/03/2020 Lora Havens, MD     04/03/2020 10:37 AM Patient Name: Caellum Mancil MRN: [id number] Epilepsy Attending: Lora Havens Referring Physician/Provider: Dr Kerney Elbe Date: 04/03/2020 Duration: 30.22 mins Patient history: 25 year old male with first time seizure. EEG to evaluate for seizure Level of alertness: Awake AEDs during EEG study:  LEV Technical aspects: This EEG study was done with scalp electrodes positioned according to the 10-20 International system of electrode placement. Electrical activity was acquired at a sampling rate of 500Hz  and reviewed with a high frequency filter of 70Hz  and a low frequency filter of 1Hz . EEG data were recorded continuously and digitally stored. Description: The posterior dominant rhythm consists of 9-10 Hz activity of moderate voltage (25-35 uV) seen predominantly in posterior head regions, symmetric and reactive to eye opening and eye closing. EEG showed intermittent let frontotemporal 3 to 6 Hz theta-delta slowing. Sharp waves were also seen in left frontotemporal region. Hyperventilation did not show any EEG change.  Physiologic  photic driving was seen during photic stimulation.  ABNORMALITY - Sharp waves, left frontotemporal region -Intermittent slow, left frontotemporal region IMPRESSION: This study showed evidence of potential epileptogenicity and cortical dysfunction arising from left frontotemporal region likely secondary to underlying tumor. No seizureswere seen throughout the recording. Priyanka Barbra Sarks    Scheduled Meds: . dexamethasone  4 mg Oral Q12H   Continuous Infusions: . sodium chloride 100 mL/hr at 04/03/20 0610  . levETIRAcetam 750 mg (04/03/20 1041)     LOS: 1 day   Marylu Lund, MD [facility] Pager On Amion  If 7PM-7AM, please contact night-coverage 04/03/2020, 4:01 PM

## 2020-04-03 NOTE — Plan of Care (Signed)
  Problem: Education: Goal: Knowledge of General Education information will improve Description: Including pain rating scale, medication(s)/side effects and non-pharmacologic comfort measures Outcome: Progressing   Problem: Health Behavior/Discharge Planning: Goal: Ability to manage health-related needs will improve Outcome: Progressing   Problem: Clinical Measurements: Goal: Ability to maintain clinical measurements within normal limits will improve Outcome: Progressing Goal: Will remain free from infection Outcome: Progressing Goal: Respiratory complications will improve Outcome: Progressing   Problem: Nutrition: Goal: Adequate nutrition will be maintained Outcome: Progressing   Problem: Coping: Goal: Level of anxiety will decrease Outcome: Progressing   Problem: Safety: Goal: Ability to remain free from injury will improve Outcome: Progressing  This nurse reinforced discussion family and patient had with surgeon re: surgery in the a.m.  Questions were answered to the satisfaction of those in attendance AEB verbalization of comprehension of information.  Please refer to surgeon's note.  Patient has been mobile with supervision .  Emotional support given to patient and to family.

## 2020-04-03 NOTE — Procedures (Signed)
Patient Name: Aadit Hagood  MRN: [id number]  Epilepsy Attending: Lora Havens  Referring Physician/Provider: Dr Kerney Elbe Date: 04/03/2020 Duration: 30.22 mins  Patient history: 25 year old male with first time seizure. EEG to evaluate for seizure  Level of alertness: Awake  AEDs during EEG study: LEV  Technical aspects: This EEG study was done with scalp electrodes positioned according to the 10-20 International system of electrode placement. Electrical activity was acquired at a sampling rate of 500Hz  and reviewed with a high frequency filter of 70Hz  and a low frequency filter of 1Hz . EEG data were recorded continuously and digitally stored.   Description: The posterior dominant rhythm consists of 9-10 Hz activity of moderate voltage (25-35 uV) seen predominantly in posterior head regions, symmetric and reactive to eye opening and eye closing. EEG showed intermittent let frontotemporal 3 to 6 Hz theta-delta slowing. Sharp waves were also seen in left frontotemporal region. Hyperventilation did not show any EEG change.  Physiologic photic driving was seen during photic stimulation.    ABNORMALITY - Sharp waves, left frontotemporal region -Intermittent slow, left frontotemporal region  IMPRESSION: This study showed evidence of potential epileptogenicity and cortical dysfunction arising from left frontotemporal region likely secondary to underlying tumor. No seizureswere seen throughout the recording.  Imaad Reuss Barbra Sarks

## 2020-04-04 ENCOUNTER — Inpatient Hospital Stay (HOSPITAL_COMMUNITY): Payer: 59

## 2020-04-04 ENCOUNTER — Inpatient Hospital Stay (HOSPITAL_COMMUNITY): Payer: 59 | Admitting: Anesthesiology

## 2020-04-04 ENCOUNTER — Encounter (HOSPITAL_COMMUNITY): Payer: Self-pay | Admitting: Internal Medicine

## 2020-04-04 ENCOUNTER — Encounter (HOSPITAL_COMMUNITY)
Admission: EM | Disposition: A | Discharge status: Home / Self Care | Payer: Self-pay | Source: Home / Self Care | Attending: Neurological Surgery

## 2020-04-04 DIAGNOSIS — C719 Malignant neoplasm of brain, unspecified: Secondary | ICD-10-CM | POA: Diagnosis present

## 2020-04-04 DIAGNOSIS — D496 Neoplasm of unspecified behavior of brain: Secondary | ICD-10-CM | POA: Diagnosis present

## 2020-04-04 HISTORY — PX: CRANIOTOMY: SHX93

## 2020-04-04 LAB — CBC
HCT: 38.3 % — ABNORMAL LOW (ref 39.0–52.0)
Hemoglobin: 13.4 g/dL (ref 13.0–17.0)
MCH: 30.2 pg (ref 26.0–34.0)
MCHC: 35 g/dL (ref 30.0–36.0)
MCV: 86.5 fL (ref 80.0–100.0)
Platelets: 276 10*3/uL (ref 150–400)
RBC: 4.43 MIL/uL (ref 4.22–5.81)
RDW: 12.6 % (ref 11.5–15.5)
WBC: 11.7 10*3/uL — ABNORMAL HIGH (ref 4.0–10.5)
nRBC: 0 % (ref 0.0–0.2)

## 2020-04-04 LAB — CREATININE, SERUM
Creatinine, Ser: 0.85 mg/dL (ref 0.61–1.24)
GFR, Estimated: >60 mL/min (ref >60)

## 2020-04-04 LAB — ABO/RH: ABO/RH(D): O POS

## 2020-04-04 LAB — TYPE AND SCREEN
ABO/RH(D): O POS
Antibody Screen: NEGATIVE

## 2020-04-04 SURGERY — CRANIOTOMY TUMOR EXCISION
Anesthesia: General | Site: Head | Laterality: Left

## 2020-04-04 MED ORDER — HYDROMORPHONE HCL 1 MG/ML IJ SOLN
INTRAMUSCULAR | Status: AC
  Filled 2020-04-04: qty 1

## 2020-04-04 MED ORDER — LIDOCAINE 2% (20 MG/ML) 5 ML SYRINGE
INTRAMUSCULAR | Status: DC | PRN
  Administered 2020-04-04: 100 mg via INTRAVENOUS

## 2020-04-04 MED ORDER — 0.9 % SODIUM CHLORIDE (POUR BTL) OPTIME
TOPICAL | Status: DC | PRN
  Administered 2020-04-04: 1000 mL

## 2020-04-04 MED ORDER — MIDAZOLAM HCL 2 MG/2ML IJ SOLN
INTRAMUSCULAR | Status: AC
  Filled 2020-04-04: qty 2

## 2020-04-04 MED ORDER — POLYETHYLENE GLYCOL 3350 17 G PO PACK: 17.0000 g | PACK | Freq: Every day | ORAL | Status: DC | PRN

## 2020-04-04 MED ORDER — ACETAMINOPHEN 650 MG RE SUPP: 650.0000 mg | RECTAL | Status: DC | PRN

## 2020-04-04 MED ORDER — PROMETHAZINE HCL 12.5 MG PO TABS
12.5000 mg | ORAL_TABLET | ORAL | Status: DC | PRN
  Filled 2020-04-04: qty 2

## 2020-04-04 MED ORDER — ONDANSETRON HCL 4 MG/2ML IJ SOLN
INTRAMUSCULAR | Status: DC | PRN
  Administered 2020-04-04: 4 mg via INTRAVENOUS

## 2020-04-04 MED ORDER — BACITRACIN ZINC 500 UNIT/GM EX OINT
TOPICAL_OINTMENT | CUTANEOUS | Status: AC
  Filled 2020-04-04: qty 28.35

## 2020-04-04 MED ORDER — HEPARIN SODIUM (PORCINE) 5000 UNIT/ML IJ SOLN
5000.0000 [IU] | Freq: Three times a day (TID) | INTRAMUSCULAR | Status: DC
  Administered 2020-04-06: 5000 [IU] via SUBCUTANEOUS
  Filled 2020-04-04: qty 1

## 2020-04-04 MED ORDER — SODIUM CHLORIDE 0.9 % IV SOLN
INTRAVENOUS | Status: DC | PRN
  Administered 2020-04-04: .1 ug/kg/min via INTRAVENOUS

## 2020-04-04 MED ORDER — THROMBIN 5000 UNITS EX SOLR
OROMUCOSAL | Status: DC | PRN
  Administered 2020-04-04: 5 mL via TOPICAL

## 2020-04-04 MED ORDER — CEFAZOLIN SODIUM-DEXTROSE 2-4 GM/100ML-% IV SOLN
2.0000 g | Freq: Three times a day (TID) | INTRAVENOUS | Status: AC
  Administered 2020-04-04 – 2020-04-05 (×2): 2 g via INTRAVENOUS
  Filled 2020-04-04 (×3): qty 100

## 2020-04-04 MED ORDER — GADOBUTROL 1 MMOL/ML IV SOLN
9.0000 mL | Freq: Once | INTRAVENOUS | Status: AC | PRN
  Administered 2020-04-04: 9 mL via INTRAVENOUS

## 2020-04-04 MED ORDER — SODIUM CHLORIDE 0.9 % IV SOLN
0.0500 ug/kg/min | INTRAVENOUS | Status: DC
  Filled 2020-04-04: qty 5000

## 2020-04-04 MED ORDER — BACITRACIN ZINC 500 UNIT/GM EX OINT
TOPICAL_OINTMENT | CUTANEOUS | Status: DC | PRN
  Administered 2020-04-04: 1 via TOPICAL

## 2020-04-04 MED ORDER — PROPOFOL 10 MG/ML IV BOLUS
INTRAVENOUS | Status: DC | PRN
  Administered 2020-04-04 (×2): 50 mg via INTRAVENOUS
  Administered 2020-04-04: 180 mg via INTRAVENOUS

## 2020-04-04 MED ORDER — PROPOFOL 500 MG/50ML IV EMUL
INTRAVENOUS | Status: DC | PRN
  Administered 2020-04-04: 25 ug/kg/min via INTRAVENOUS

## 2020-04-04 MED ORDER — ONDANSETRON HCL 4 MG PO TABS: 4.0000 mg | ORAL_TABLET | ORAL | Status: DC | PRN

## 2020-04-04 MED ORDER — CHLORHEXIDINE GLUCONATE 0.12 % MT SOLN: 15.0000 mL | Freq: Once | OROMUCOSAL | Status: AC

## 2020-04-04 MED ORDER — FENTANYL CITRATE (PF) 100 MCG/2ML IJ SOLN: 25.0000 ug | INTRAMUSCULAR | Status: DC | PRN

## 2020-04-04 MED ORDER — ACETAMINOPHEN 10 MG/ML IV SOLN
INTRAVENOUS | Status: AC
  Filled 2020-04-04: qty 100

## 2020-04-04 MED ORDER — THROMBIN 20000 UNITS EX SOLR
CUTANEOUS | Status: AC
  Filled 2020-04-04: qty 20000

## 2020-04-04 MED ORDER — SUGAMMADEX SODIUM 200 MG/2ML IV SOLN
INTRAVENOUS | Status: DC | PRN
  Administered 2020-04-04: 200 mg via INTRAVENOUS

## 2020-04-04 MED ORDER — CHLORHEXIDINE GLUCONATE CLOTH 2 % EX PADS
6.0000 | MEDICATED_PAD | Freq: Every day | CUTANEOUS | Status: DC
  Administered 2020-04-04 – 2020-04-05 (×2): 6 via TOPICAL

## 2020-04-04 MED ORDER — LACTATED RINGERS IV SOLN: INTRAVENOUS | Status: DC | PRN

## 2020-04-04 MED ORDER — LIDOCAINE-EPINEPHRINE 1 %-1:100000 IJ SOLN
INTRAMUSCULAR | Status: AC
  Filled 2020-04-04: qty 1

## 2020-04-04 MED ORDER — PROPOFOL 10 MG/ML IV BOLUS
INTRAVENOUS | Status: AC
  Filled 2020-04-04: qty 40

## 2020-04-04 MED ORDER — PROMETHAZINE HCL 25 MG/ML IJ SOLN: 6.2500 mg | INTRAMUSCULAR | Status: DC | PRN

## 2020-04-04 MED ORDER — CEFAZOLIN SODIUM-DEXTROSE 2-3 GM-%(50ML) IV SOLR
INTRAVENOUS | Status: DC | PRN
  Administered 2020-04-04: 2 g via INTRAVENOUS

## 2020-04-04 MED ORDER — THROMBIN 20000 UNITS EX SOLR
CUTANEOUS | Status: DC | PRN
  Administered 2020-04-04: 20 mL via TOPICAL

## 2020-04-04 MED ORDER — DOCUSATE SODIUM 100 MG PO CAPS
100.0000 mg | ORAL_CAPSULE | Freq: Two times a day (BID) | ORAL | Status: DC
  Administered 2020-04-05 – 2020-04-06 (×3): 100 mg via ORAL
  Filled 2020-04-04 (×3): qty 1

## 2020-04-04 MED ORDER — SODIUM CHLORIDE 0.9 % IV SOLN: INTRAVENOUS | Status: DC | PRN

## 2020-04-04 MED ORDER — DEXAMETHASONE SODIUM PHOSPHATE 10 MG/ML IJ SOLN
INTRAMUSCULAR | Status: DC | PRN
  Administered 2020-04-04: 10 mg via INTRAVENOUS

## 2020-04-04 MED ORDER — ALBUMIN HUMAN 5 % IV SOLN: INTRAVENOUS | Status: DC | PRN

## 2020-04-04 MED ORDER — LABETALOL HCL 5 MG/ML IV SOLN
5.0000 mg | Freq: Once | INTRAVENOUS | Status: AC
  Administered 2020-04-04: 5 mg via INTRAVENOUS

## 2020-04-04 MED ORDER — ROCURONIUM BROMIDE 10 MG/ML (PF) SYRINGE
PREFILLED_SYRINGE | INTRAVENOUS | Status: DC | PRN
  Administered 2020-04-04: 30 mg via INTRAVENOUS
  Administered 2020-04-04: 70 mg via INTRAVENOUS
  Administered 2020-04-04: 20 mg via INTRAVENOUS
  Administered 2020-04-04: 30 mg via INTRAVENOUS

## 2020-04-04 MED ORDER — ONDANSETRON HCL 4 MG/2ML IJ SOLN
4.0000 mg | INTRAMUSCULAR | Status: DC | PRN
  Administered 2020-04-04 – 2020-04-05 (×3): 4 mg via INTRAVENOUS
  Filled 2020-04-04 (×3): qty 2

## 2020-04-04 MED ORDER — FAMOTIDINE IN NACL 20-0.9 MG/50ML-% IV SOLN
20.0000 mg | Freq: Two times a day (BID) | INTRAVENOUS | Status: DC
  Administered 2020-04-04: 20 mg via INTRAVENOUS
  Filled 2020-04-04 (×2): qty 50

## 2020-04-04 MED ORDER — HYDROCODONE-ACETAMINOPHEN 5-325 MG PO TABS
1.0000 | ORAL_TABLET | ORAL | Status: DC | PRN
Start: 2020-04-04 — End: 2020-04-06
  Administered 2020-04-04: 1 via ORAL
  Filled 2020-04-04: qty 1

## 2020-04-04 MED ORDER — SODIUM CHLORIDE 0.9 % IV SOLN: INTRAVENOUS | Status: DC

## 2020-04-04 MED ORDER — LABETALOL HCL 5 MG/ML IV SOLN: 10.0000 mg | INTRAVENOUS | Status: DC | PRN

## 2020-04-04 MED ORDER — ESMOLOL HCL 100 MG/10ML IV SOLN
INTRAVENOUS | Status: DC | PRN
  Administered 2020-04-04: 20 mg via INTRAVENOUS
  Administered 2020-04-04: 40 mg via INTRAVENOUS
  Administered 2020-04-04 (×2): 20 mg via INTRAVENOUS

## 2020-04-04 MED ORDER — ACETAMINOPHEN 325 MG PO TABS
650.0000 mg | ORAL_TABLET | ORAL | Status: DC | PRN
  Administered 2020-04-05 (×2): 650 mg via ORAL
  Filled 2020-04-04 (×2): qty 2

## 2020-04-04 MED ORDER — LIDOCAINE-EPINEPHRINE 1 %-1:100000 IJ SOLN
INTRAMUSCULAR | Status: DC | PRN
  Administered 2020-04-04: 10 mL via INTRADERMAL

## 2020-04-04 MED ORDER — FENTANYL CITRATE (PF) 250 MCG/5ML IJ SOLN
INTRAMUSCULAR | Status: DC | PRN
  Administered 2020-04-04: 50 ug via INTRAVENOUS
  Administered 2020-04-04: 150 ug via INTRAVENOUS
  Administered 2020-04-04: 50 ug via INTRAVENOUS

## 2020-04-04 MED ORDER — FENTANYL CITRATE (PF) 250 MCG/5ML IJ SOLN
INTRAMUSCULAR | Status: AC
  Filled 2020-04-04: qty 5

## 2020-04-04 MED ORDER — HYDROMORPHONE HCL 1 MG/ML IJ SOLN
0.5000 mg | INTRAMUSCULAR | Status: DC | PRN
  Administered 2020-04-04 – 2020-04-05 (×4): 0.5 mg via INTRAVENOUS
  Filled 2020-04-04 (×3): qty 1

## 2020-04-04 MED ORDER — ACETAMINOPHEN 10 MG/ML IV SOLN
INTRAVENOUS | Status: DC | PRN
  Administered 2020-04-04: 1000 mg via INTRAVENOUS

## 2020-04-04 MED ORDER — THROMBIN 5000 UNITS EX KIT
PACK | CUTANEOUS | Status: AC
  Filled 2020-04-04: qty 1

## 2020-04-04 MED ORDER — CHLORHEXIDINE GLUCONATE 0.12 % MT SOLN
OROMUCOSAL | Status: AC
  Administered 2020-04-04: 15 mL via OROMUCOSAL
  Filled 2020-04-04: qty 15

## 2020-04-04 SURGICAL SUPPLY — 90 items
BAND RUBBER #18 3X1/16 STRL (MISCELLANEOUS) IMPLANT
BENZOIN TINCTURE PRP APPL 2/3 (GAUZE/BANDAGES/DRESSINGS) IMPLANT
BLADE CLIPPER SURG (BLADE) ×3 IMPLANT
BLADE SAW GIGLI 16 STRL (MISCELLANEOUS) IMPLANT
BLADE SURG 15 STRL LF DISP TIS (BLADE) IMPLANT
BLADE SURG 15 STRL SS (BLADE)
BNDG GAUZE ELAST 4 BULKY (GAUZE/BANDAGES/DRESSINGS) IMPLANT
BNDG STRETCH 4X75 STRL LF (GAUZE/BANDAGES/DRESSINGS) IMPLANT
BUR ACORN 9.0 PRECISION (BURR) ×2 IMPLANT
BUR ACORN 9.0MM PRECISION (BURR) ×1
BUR ROUND FLUTED 4 SOFT TCH (BURR) IMPLANT
BUR ROUND FLUTED 4MM SOFT TCH (BURR)
BUR SPIRAL ROUTER 2.3 (BUR) ×2 IMPLANT
BUR SPIRAL ROUTER 2.3MM (BUR) ×1
CANISTER SUCT 3000ML PPV (MISCELLANEOUS) ×6 IMPLANT
CATH VENTRIC 35X38 W/TROCAR LG (CATHETERS) IMPLANT
CLIP VESOCCLUDE MED 6/CT (CLIP) IMPLANT
CNTNR URN SCR LID CUP LEK RST (MISCELLANEOUS) ×1 IMPLANT
CONT SPEC 4OZ STRL OR WHT (MISCELLANEOUS) ×2
COVER BURR HOLE UNIV 10 (Orthopedic Implant) ×6 IMPLANT
COVER MAYO STAND STRL (DRAPES) IMPLANT
COVER WAND RF STERILE (DRAPES) ×3 IMPLANT
DECANTER SPIKE VIAL GLASS SM (MISCELLANEOUS) ×3 IMPLANT
DRAIN SUBARACHNOID (WOUND CARE) IMPLANT
DRAPE HALF SHEET 40X57 (DRAPES) ×3 IMPLANT
DRAPE MICROSCOPE LEICA (MISCELLANEOUS) ×3 IMPLANT
DRAPE NEUROLOGICAL W/INCISE (DRAPES) ×3 IMPLANT
DRAPE STERI IOBAN 125X83 (DRAPES) IMPLANT
DRAPE SURG 17X23 STRL (DRAPES) IMPLANT
DRAPE WARM FLUID 44X44 (DRAPES) ×3 IMPLANT
DRSG ADAPTIC 3X8 NADH LF (GAUZE/BANDAGES/DRESSINGS) IMPLANT
DRSG TELFA 3X8 NADH (GAUZE/BANDAGES/DRESSINGS) IMPLANT
DURAPREP 6ML APPLICATOR 50/CS (WOUND CARE) ×3 IMPLANT
ELECT REM PT RETURN 9FT ADLT (ELECTROSURGICAL) ×3
ELECTRODE REM PT RTRN 9FT ADLT (ELECTROSURGICAL) ×1 IMPLANT
EVACUATOR 1/8 PVC DRAIN (DRAIN) IMPLANT
EVACUATOR SILICONE 100CC (DRAIN) IMPLANT
FORCEPS BIPOLAR SPETZLER 8 1.0 (NEUROSURGERY SUPPLIES) ×3 IMPLANT
GAUZE 4X4 16PLY RFD (DISPOSABLE) IMPLANT
GAUZE SPONGE 4X4 12PLY STRL (GAUZE/BANDAGES/DRESSINGS) IMPLANT
GLOVE BIO SURGEON STRL SZ7 (GLOVE) IMPLANT
GLOVE BIOGEL PI IND STRL 7.5 (GLOVE) ×1 IMPLANT
GLOVE BIOGEL PI INDICATOR 7.5 (GLOVE) ×2
GLOVE ECLIPSE 7.5 STRL STRAW (GLOVE) ×3 IMPLANT
GLOVE EXAM NITRILE LRG STRL (GLOVE) IMPLANT
GLOVE EXAM NITRILE XS STR PU (GLOVE) IMPLANT
GLOVE SURG UNDER POLY LF SZ7 (GLOVE) IMPLANT
GOWN STRL REUS W/ TWL LRG LVL3 (GOWN DISPOSABLE) ×3 IMPLANT
GOWN STRL REUS W/ TWL XL LVL3 (GOWN DISPOSABLE) ×1 IMPLANT
GOWN STRL REUS W/TWL 2XL LVL3 (GOWN DISPOSABLE) IMPLANT
GOWN STRL REUS W/TWL LRG LVL3 (GOWN DISPOSABLE) ×6
GOWN STRL REUS W/TWL XL LVL3 (GOWN DISPOSABLE) ×2
HEMOSTAT POWDER KIT SURGIFOAM (HEMOSTASIS) ×3 IMPLANT
HEMOSTAT SURGICEL 2X14 (HEMOSTASIS) ×3 IMPLANT
IV NS 1000ML (IV SOLUTION) ×2
IV NS 1000ML BAXH (IV SOLUTION) ×1 IMPLANT
KIT BASIN OR (CUSTOM PROCEDURE TRAY) ×3 IMPLANT
KIT DRAIN CSF ACCUDRAIN (MISCELLANEOUS) IMPLANT
KIT TURNOVER KIT B (KITS) ×3 IMPLANT
NEEDLE HYPO 22GX1.5 SAFETY (NEEDLE) ×3 IMPLANT
NEEDLE SPNL 18GX3.5 QUINCKE PK (NEEDLE) IMPLANT
NS IRRIG 1000ML POUR BTL (IV SOLUTION) ×9 IMPLANT
PACK CRANIOTOMY CUSTOM (CUSTOM PROCEDURE TRAY) ×3 IMPLANT
PATTIES SURGICAL .25X.25 (GAUZE/BANDAGES/DRESSINGS) IMPLANT
PATTIES SURGICAL .5 X.5 (GAUZE/BANDAGES/DRESSINGS) IMPLANT
PATTIES SURGICAL .5 X3 (DISPOSABLE) IMPLANT
PATTIES SURGICAL 1/4 X 3 (GAUZE/BANDAGES/DRESSINGS) IMPLANT
PATTIES SURGICAL 1X1 (DISPOSABLE) IMPLANT
PIN MAYFIELD SKULL DISP (PIN) ×3 IMPLANT
PLATE DOUBLE Y CMF 6H (Plate) ×3 IMPLANT
SCREW UNIII AXS SD 1.5X4 (Screw) ×45 IMPLANT
SPECIMEN JAR SMALL (MISCELLANEOUS) IMPLANT
SPONGE NEURO XRAY DETECT 1X3 (DISPOSABLE) IMPLANT
SPONGE SURGIFOAM ABS GEL 100 (HEMOSTASIS) ×3 IMPLANT
STAPLER VISISTAT 35W (STAPLE) ×3 IMPLANT
SUT ETHILON 3 0 FSL (SUTURE) IMPLANT
SUT ETHILON 3 0 PS 1 (SUTURE) IMPLANT
SUT MNCRL AB 3-0 PS2 18 (SUTURE) IMPLANT
SUT MON AB 3-0 SH 27 (SUTURE)
SUT MON AB 3-0 SH27 (SUTURE) IMPLANT
SUT NURALON 4 0 TR CR/8 (SUTURE) ×9 IMPLANT
SUT SILK 0 TIES 10X30 (SUTURE) IMPLANT
SUT VIC AB 2-0 CP2 18 (SUTURE) ×3 IMPLANT
TOWEL GREEN STERILE (TOWEL DISPOSABLE) ×3 IMPLANT
TOWEL GREEN STERILE FF (TOWEL DISPOSABLE) ×3 IMPLANT
TRAY FOLEY MTR SLVR 16FR STAT (SET/KITS/TRAYS/PACK) ×3 IMPLANT
TUBE CONNECTING 12'X1/4 (SUCTIONS) ×1
TUBE CONNECTING 12X1/4 (SUCTIONS) ×2 IMPLANT
UNDERPAD 30X36 HEAVY ABSORB (UNDERPADS AND DIAPERS) ×3 IMPLANT
WATER STERILE IRR 1000ML POUR (IV SOLUTION) ×3 IMPLANT

## 2020-04-04 NOTE — Anesthesia Preprocedure Evaluation (Addendum)
Anesthesia Evaluation  Patient identified by MRN, date of birth, ID band Patient awake    Reviewed: Allergy & Precautions, NPO status , Patient's Chart, lab work & pertinent test results  Airway Mallampati: II  TM Distance: >3 FB Neck ROM: Full    Dental  (+) Teeth Intact, Dental Advisory Given, Implants,    Pulmonary neg pulmonary ROS,    Pulmonary exam normal breath sounds clear to auscultation       Cardiovascular negative cardio ROS Normal cardiovascular exam Rhythm:Regular Rate:Normal     Neuro/Psych Seizures -,  Left frontal brain mass    GI/Hepatic negative GI ROS, Neg liver ROS,   Endo/Other  negative endocrine ROS  Renal/GU negative Renal ROS     Musculoskeletal negative musculoskeletal ROS (+)   Abdominal   Peds  Hematology negative hematology ROS (+)   Anesthesia Other Findings Day of surgery medications reviewed with the patient.  Reproductive/Obstetrics                            Anesthesia Physical Anesthesia Plan  ASA: III  Anesthesia Plan: General   Post-op Pain Management:    Induction: Intravenous  PONV Risk Score and Plan: 3 and Midazolam, Dexamethasone and Ondansetron  Airway Management Planned: Oral ETT  Additional Equipment: Arterial line  Intra-op Plan:   Post-operative Plan: Extubation in OR  Informed Consent: I have reviewed the patients History and Physical, chart, labs and discussed the procedure including the risks, benefits and alternatives for the proposed anesthesia with the patient or authorized representative who has indicated his/her understanding and acceptance.     Dental advisory given  Plan Discussed with: CRNA  Anesthesia Plan Comments: (2 large bore PIV vs CVL)        Anesthesia Quick Evaluation

## 2020-04-04 NOTE — Progress Notes (Signed)
Neurosurgery Service Progress Note  Subjective: No acute events overnight, no new complaints, no seizures overnight   Objective: Vitals:   04/03/20 1600 04/03/20 1942 04/03/20 2353 04/04/20 0417  BP: (!) 126/53 (!) 127/54 (!) 105/44 (!) 111/40  Pulse: 71 80 81 69  Resp: 18 20 18 18   Temp: 98.6 F (37 C) 98.1 F (36.7 C) 97.9 F (36.6 C) 98.4 F (36.9 C)  TempSrc: Oral Oral Oral Oral  SpO2:  99% 98% 100%  Weight:      Height:        Physical Exam: AOx3, PERRL, EOMI, FS, TM, Strength 5/5 x4, SILTx4, speech fluent with normal content  Assessment & Plan: 25 y.o. man p/w complex sz localizing to left frontal region, CT/MRI confirmed left frontal brain mass  -OR today for resection -4N ICU post-op   [name]  04/04/20 7:33 AM

## 2020-04-04 NOTE — Brief Op Note (Signed)
04/04/2020  11:40 AM  PATIENT:  Gordon [name]  25 y.o. male  PRE-OPERATIVE DIAGNOSIS:  Left Frontal Brain Mass  POST-OPERATIVE DIAGNOSIS:  Left Frontal Brain Mass  PROCEDURE:  Procedure(s): LEFT CRANIOTOMY FOR TUMOR EXCISION (Left)  SURGEON:  Surgeon(s) and Role:    * [name], MD - Primary    * Kary Kos, MD - Assisting  PHYSICIAN ASSISTANT:   ANESTHESIA:   general  EBL:  200 mL   BLOOD ADMINISTERED:none  DRAINS: none   LOCAL MEDICATIONS USED:  LIDOCAINE   SPECIMEN:  Source of Specimen:  Left frontal brain tumor  DISPOSITION OF SPECIMEN:  PATHOLOGY  COUNTS:  YES  TOURNIQUET:  * No tourniquets in log *  DICTATION: .Note written in EPIC  PLAN OF CARE: Admit to inpatient   PATIENT DISPOSITION:  PACU - hemodynamically stable.   Delay start of Pharmacological VTE agent (>24hrs) due to surgical blood loss or risk of bleeding: yes

## 2020-04-04 NOTE — Transfer of Care (Signed)
Immediate Anesthesia Transfer of Care Note  Patient: Gordon [name]  Procedure(s) Performed: LEFT CRANIOTOMY FOR TUMOR EXCISION (Left Head)  Patient Location: PACU  Anesthesia Type:General  Level of Consciousness: awake, alert  and oriented  Airway & Oxygen Therapy: Patient Spontanous Breathing  Post-op Assessment: Report given to RN and Post -op Vital signs reviewed and stable  Post vital signs: Reviewed and stable  Last Vitals:  Vitals Value Taken Time  BP 138/75 04/04/20 1132  Temp 36.5 C 04/04/20 1125  Pulse 87 04/04/20 1135  Resp 15 04/04/20 1135  SpO2 99 % 04/04/20 1135  Vitals shown include unvalidated device data.  Last Pain:  Vitals:   04/04/20 0417  TempSrc: Oral  PainSc:          Complications: No complications documented.

## 2020-04-04 NOTE — Op Note (Signed)
PATIENT: Gordon [name]  DAY OF SURGERY: 04/04/20   PRE-OPERATIVE DIAGNOSIS:  Left frontal brain tumor   POST-OPERATIVE DIAGNOSIS:  Same   PROCEDURE:  Left frontal craniotomy for tumor resection, use of frameless stereotaxy   SURGEON:  Surgeon(s) and Role:    [name], MD - Primary    Kary Kos, MD - Assisting   ANESTHESIA: ETGA   BRIEF HISTORY: This is a 25 year old man who presented with a first time seizure localizing to the left frontal lobe. CTH/MRI showed an underlying mass consistent with primary brain tumor. I therefore recommended craniotomy for biopsy and resection. This was discussed with the patient as well as risks, benefits, and alternatives and wished to proceed with surgery.   OPERATIVE DETAIL: The patient was taken to the operating room and placed on the OR table in the supine position. A formal time out was performed with two patient identifiers and confirmed the operative site. Anesthesia was induced by the anesthesia team. The [place] head holder was applied to the head and a registration array was attached to the [place]. This was co-registered with the patient's preoperative imaging, the fit appeared to be acceptable. Using frameless stereotaxy, the operative trajectory was planned and the incision was marked. Hair was clipped with surgical clippers over the incision and the area was then prepped and draped in a sterile fashion.  A longitudinal linear incision was placed in the left frontal region. A craniotomy flap was turned with care to avoid injury to the SSS. Thankfully, given his young age, his dura was not very adherent and was easy to mobilize and the flap was turned without incident. Dura was opened and flapped medially. Tumor was immediately visible frontally with pale infiltrated appearing cortex. This corresponded to the enhancing portion on the MRI, so this gyrus was taken as a bloc and sent to pathology for a frozen. 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 Dr. Saintclair Halsted scrubbed in to help retract tumor and make the planes clearer during dissection. The tumor was more firm than surrounding brain and, at some areas, had a plane that could be followed well. This was resected and sent to pathology, margins were checked with stereotaxy and did not have any residual apparent. Hemostasis was confirmed, the wound was copiously irrigated, the dura was closed, gelfoam was placed in the epidural space, and the bone flap was replaced with titanium plates and screws.  All instrument and sponge counts were correct, the incision was then closed in layers. The patient was then returned to anesthesia for emergence. No apparent complications at the completion of the procedure.   EBL:  276mL   DRAINS: none   SPECIMENS: Left frontal brain tumor   [name], MD 04/04/20 11:40 AM

## 2020-04-04 NOTE — Progress Notes (Signed)
Pt picked up by OR team to transport to OR for surgery. Report given off to OR nurse. Pt eye contacts and other belongings given to parents. Pt transported off unit via bed with all belongs and parents to the side. Delia Heady RN

## 2020-04-04 NOTE — Progress Notes (Addendum)
Chart was reviewed, however pt was not seen since pt had already been brought to OR early this AM for surgery. BMET and CBC trends appear stable and vital signs are stable as well. Discussed with [name] who will now assume care of patient while in hospital. [facility] will sign off for now. Please call if we can be of further assistance.

## 2020-04-04 NOTE — Discharge Summary (Signed)
Discharge Summary  Date of Admission: 04/02/2020  Date of Discharge: 04/04/20  Attending Physician: Emelda Brothers, MD  Hospital Course: Patient was admitted after a first time seizure with semiology localizing to the left frontal lobe. A CT and the MRI showed a left frontal mass. He was taken to the OR on 04/04/20 for a left craniotomy and resection. Post-op MRI showed some possible residual in the inferior cavity, but otherwise complete resection. His post-operative course was uncomplicated and the patient was discharged home on 04/06/20. He will follow up in clinic with me in 2 weeks. Final pathology was pending at the time of discharge.  Neurologic exam at discharge:  AOx3, PERRL, EOMI, FS, TM Strength 5/5 x4, SILTx4, no drift, speech fluent with normal content  Discharge diagnosis: Brain tumor  [name], MD 04/04/20 3:23 PM

## 2020-04-04 NOTE — Anesthesia Postprocedure Evaluation (Signed)
Anesthesia Post Note  Patient: Gordon [name]  Procedure(s) Performed: LEFT CRANIOTOMY FOR TUMOR EXCISION (Left Head)     Patient location during evaluation: PACU Anesthesia Type: General Level of consciousness: awake and alert Pain management: pain level controlled Vital Signs Assessment: post-procedure vital signs reviewed and stable Respiratory status: spontaneous breathing, nonlabored ventilation, respiratory function stable and patient connected to nasal cannula oxygen Cardiovascular status: blood pressure returned to baseline and stable Postop Assessment: no apparent nausea or vomiting Anesthetic complications: no   No complications documented.  Last Vitals:  Vitals:   04/04/20 1400 04/04/20 1600  BP: 127/67   Pulse:    Resp: 16   Temp:  37.2 C  SpO2:      Last Pain:  Vitals:   04/04/20 1600  TempSrc: Oral  PainSc:                  Catalina Gravel

## 2020-04-04 NOTE — Progress Notes (Signed)
Neurosurgery Service Post-operative progress note  Assessment & Plan: 25 y.o. man s/p L F crani for tumor rsxn, extubated, Ox1 with normal speech and FCx4  -admit to 4[facility] ICU -MRI tonight w/wo contrast if able, will discuss at tumor board in AM -PT/OT in AM -cont keppra -[place]  04/04/20 11:37 AM

## 2020-04-04 NOTE — Anesthesia Procedure Notes (Signed)
Arterial Line Insertion Start/End3/13/2022 7:50 AM, 04/04/2020 7:55 AM Performed by: Lance Coon, CRNA  Patient location: Pre-op. Preanesthetic checklist: patient identified, IV checked, site marked, risks and benefits discussed, surgical consent, monitors and equipment checked, pre-op evaluation, timeout performed and anesthesia consent Lidocaine 1% used for infiltration Right, radial was placed Catheter size: 20 Fr Hand hygiene performed  and maximum sterile barriers used   Attempts: 1 Procedure performed without using ultrasound guided technique. Following insertion, dressing applied. Post procedure assessment: normal and unchanged

## 2020-04-04 NOTE — Anesthesia Procedure Notes (Signed)
Procedure Name: Intubation Date/Time: 04/04/2020 7:53 AM Performed by: Clearnce Sorrel, CRNA Pre-anesthesia Checklist: Patient identified, Emergency Drugs available, Suction available and Patient being monitored Patient Re-evaluated:Patient Re-evaluated prior to induction Oxygen Delivery Method: Circle system utilized Preoxygenation: Pre-oxygenation with 100% oxygen Induction Type: IV induction Ventilation: Mask ventilation without difficulty Laryngoscope Size: Mac and 4 Grade View: Grade I Tube type: Oral Tube size: 8.0 mm Number of attempts: 1 Airway Equipment and Method: Stylet and Oral airway Placement Confirmation: ETT inserted through vocal cords under direct vision,  positive ETCO2 and breath sounds checked- equal and bilateral Secured at: 24 cm Tube secured with: Tape Dental Injury: Teeth and Oropharynx as per pre-operative assessment

## 2020-04-05 ENCOUNTER — Encounter (HOSPITAL_COMMUNITY): Payer: Self-pay | Admitting: Neurological Surgery

## 2020-04-05 DIAGNOSIS — R569 Unspecified convulsions: Secondary | ICD-10-CM | POA: Diagnosis not present

## 2020-04-05 DIAGNOSIS — G939 Disorder of brain, unspecified: Secondary | ICD-10-CM

## 2020-04-05 MED ORDER — DEXAMETHASONE SODIUM PHOSPHATE 4 MG/ML IJ SOLN
4.0000 mg | Freq: Two times a day (BID) | INTRAMUSCULAR | Status: DC
  Administered 2020-04-05 – 2020-04-06 (×2): 4 mg via INTRAVENOUS
  Filled 2020-04-05 (×3): qty 1

## 2020-04-05 MED ORDER — LEVETIRACETAM 750 MG PO TABS
750.0000 mg | ORAL_TABLET | Freq: Two times a day (BID) | ORAL | Status: DC
  Administered 2020-04-05 – 2020-04-06 (×2): 750 mg via ORAL
  Filled 2020-04-05 (×2): qty 1

## 2020-04-05 NOTE — Consult Note (Signed)
[facility] Consult Note  Patient Care Team: Chesley Noon, MD as PCP - General (Family Medicine)  CHIEF COMPLAINTS/PURPOSE OF CONSULTATION:  Left frontal mass Focal seizure  HISTORY OF PRESENTING ILLNESS:  Gordon [name] 25 y.o. male presented to medical attention this past weekend with episode of loss of awareness and generalized shaking consistent with seizure episode.  CNS imaging demonstrated an enhancing left frontal mass, which was resected yesterday by [name].  He tolerated surgery well, has no significant complaints today.  Hasn't been up walking yet.  Currently a business student at [employer] other significant medical history.  MEDICAL HISTORY:  Past Medical History:  Diagnosis Date  . Bulging lumbar disc   . Seizure ([place]) 04/03/2020   x 1 at home pre-admission.  Otherwise, no history.    SURGICAL HISTORY: Past Surgical History:  Procedure Laterality Date  . TONSILLECTOMY    . WISDOM TOOTH EXTRACTION      SOCIAL HISTORY: Social History   Socioeconomic History  . Marital status: Single    Spouse name: Not on file  . Number of children: 0  . Years of education: Not on file  . Highest education level: Not on file  Occupational History  . Not on file  Tobacco Use  . Smoking status: Never Smoker  . Smokeless tobacco: Never Used  Vaping Use  . Vaping Use: Never used  Substance and Sexual Activity  . Alcohol use: No    Alcohol/week: 0.0 standard drinks  . Drug use: No  . Sexual activity: Yes    Birth control/protection: Condom  Other Topics Concern  . Not on file  Social History Narrative  . Not on file   Social Determinants of Health   Financial Resource Strain: Not on file  Food Insecurity: Not on file  Transportation Needs: Not on file  Physical Activity: Not on file  Stress: Not on file  Social Connections: Not on file  Intimate Partner Violence: Not on file    FAMILY HISTORY: History reviewed.  No pertinent family history.  ALLERGIES:  has No Known Allergies.  MEDICATIONS:  Current Facility-Administered Medications  Medication Dose Route Frequency Provider Last Rate Last Admin  . acetaminophen (TYLENOL) tablet 650 mg  650 mg Oral Q4H PRN [name], MD   650 mg at 04/05/20 1041   Or  . acetaminophen (TYLENOL) suppository 650 mg  650 mg Rectal Q4H PRN [name], MD      . Chlorhexidine Gluconate Cloth 2 % PADS 6 each  6 each Topical Daily [name], MD   6 each at 04/05/20 1016  . docusate sodium (COLACE) capsule 100 mg  100 mg Oral BID [name], MD   100 mg at 04/05/20 0914  . [START ON 04/06/2020] heparin injection 5,000 Units  5,000 Units Subcutaneous Q8H Ostergard, Joyice Faster, MD      . HYDROcodone-acetaminophen (NORCO/VICODIN) 5-325 MG per tablet 1 tablet  1 tablet Oral Q4H PRN [name], MD   1 tablet at 04/04/20 1720  . HYDROmorphone (DILAUDID) injection 0.5 mg  0.5 mg Intravenous Q3H PRN [name], MD   0.5 mg at 04/05/20 1335  . levETIRAcetam (KEPPRA) tablet 750 mg  750 mg Oral BID [name], MD      . ondansetron [facility]) tablet 4 mg  4 mg Oral Q4H PRN [name], MD       Or  . ondansetron [facility]) injection 4  mg  4 mg Intravenous Q4H PRN [name], MD   4 mg at 04/05/20 1330  . polyethylene glycol (MIRALAX / GLYCOLAX) packet 17 g  17 g Oral Daily PRN [name], MD      . promethazine (PHENERGAN) tablet 12.5-25 mg  12.5-25 mg Oral Q4H PRN [name], MD        REVIEW OF SYSTEMS:   Constitutional: Denies fevers, chills or abnormal weight loss Eyes: Denies blurriness of vision Ears, nose, mouth, throat, and face: Denies mucositis or sore throat Respiratory: Denies cough, dyspnea or wheezes Cardiovascular: Denies palpitation, chest discomfort or lower extremity swelling Gastrointestinal:  Denies nausea, constipation, diarrhea GU: Denies dysuria or incontinence Skin:  Denies abnormal skin rashes Neurological: Per HPI Musculoskeletal: Denies joint pain, back or neck discomfort. No decrease in ROM Behavioral/Psych: Denies anxiety, disturbance in thought content, and mood instability   PHYSICAL EXAMINATION: Vitals:   04/05/20 1300 04/05/20 1507  BP: 130/69 125/63  Pulse:  70  Resp: 19 16  Temp:  98.6 F (37 C)  SpO2:  96%   KPS: 80. General: Alert, cooperative, pleasant, in no acute distress Head: Craniotomy scar noted, dry and intact. EENT: No conjunctival injection or scleral icterus. Oral mucosa moist Lungs: Resp effort normal Cardiac: Regular rate and rhythm Abdomen: Soft, non-distended abdomen Skin: No rashes cyanosis or petechiae. Extremities: No clubbing or edema  NEUROLOGIC EXAM: Mental Status: Drowsy, alert with verbal stim, attentive to examiner. Oriented to self and environment. Language is fluent with intact comprehension.  Cranial Nerves: Visual acuity is grossly normal. Visual fields are full. Extra-ocular movements intact. No ptosis. Face is symmetric, tongue midline. Motor: Tone and bulk are normal. Power is full in both arms and legs. Reflexes are symmetric, no pathologic reflexes present. Intact finger to nose bilaterally Sensory: Intact to light touch and temperature Gait: Deferred   LABORATORY DATA:  I have reviewed the data as listed Lab Results  Component Value Date   WBC 11.7 (H) 04/04/2020   HGB 13.4 04/04/2020   HCT 38.3 (L) 04/04/2020   MCV 86.5 04/04/2020   PLT 276 04/04/2020   Recent Labs    04/02/20 1730 04/03/20 0728 04/04/20 1303  NA 138 137  --   K 3.7 4.4  --   CL 102 107  --   CO2 24 25  --   GLUCOSE 102* 111*  --   BUN 13 9  --   CREATININE 1.04 0.90 0.85  CALCIUM 10.0 9.7  --   GFRNONAA >60 >60 >60  PROT 8.0 6.9  --   ALBUMIN 5.1* 4.2  --   AST 22 21  --   ALT 15 15  --   ALKPHOS 49 43  --   BILITOT 0.3 0.9  --     RADIOGRAPHIC STUDIES: I have personally reviewed the radiological  images as listed and agreed with the findings in the report. CT Head Wo Contrast  Result Date: 04/02/2020 CLINICAL DATA:  Seizure, nontraumatic (Age 34-40y) EXAM: CT HEAD WITHOUT CONTRAST TECHNIQUE: Contiguous axial images were obtained from the base of the skull through the vertex without intravenous contrast. COMPARISON:  None. FINDINGS: Brain: Ill-defined area of cortical and subcortical low-density involving the left frontal lobe, for example series 2, image 22. The may be mild surrounding edema. Low-density extends to the frontal horn of the left lateral ventricle. No hemorrhage. Trace left right midline shift. Normal ventricular size without hydrocephalus. Basilar cisterns are patent. Vascular: No hyperdense vessel.  Skull: No fracture or focal lesion. Sinuses/Orbits: Minimal opacification of left ethmoid air cell. Otherwise clear paranasal sinuses. Mastoid air cells are well aerated. Included orbits are unremarkable. Other: None. IMPRESSION: 1. Ill-defined area of cortical and subcortical low-density involving the left frontal lobe with questionable mild surrounding edema and trace left-to-right midline shift. Favor neoplasm, with differential of infection or infarct. Recommend further evaluation with MRI, without and with IV contrast. 2. No hemorrhage or midline shift. These results were called by telephone at the time of interpretation on 04/02/2020 at 6:29 pm to provider [facility] , who verbally acknowledged these results. Electronically Signed   By: Keith Rake M.D.   On: 04/02/2020 18:29   MR BRAIN W WO CONTRAST  Result Date: 04/04/2020 CLINICAL DATA:  Postop day 0 from left frontal tumor resection. EXAM: MRI HEAD WITHOUT AND WITH CONTRAST TECHNIQUE: Multiplanar, multiecho pulse sequences of the brain and surrounding structures were obtained without and with intravenous contrast. CONTRAST:  68mL GADAVIST GADOBUTROL 1 MMOL/ML IV SOLN COMPARISON:  04/02/2020 FINDINGS: Brain: Sequelae of  interval left frontal craniotomy and tumor resection are identified. There is a resection cavity medially in the left frontal lobe containing blood products. Pneumocephalus is noted along with a small amount of extra-axial fluid subjacent to the craniotomy, and there is mild mass effect on the left frontal horn without midline shift or hydrocephalus. There is a small amount of T2 FLAIR hyperintensity adjacent to the inferior aspect of the resection cavity, most notable anteriorly (series 7, image 35) for which a very small amount of residual tumor is possible. No suspicious enhancement is identified. Postoperative smooth dural enhancement is noted. There is no acute infarct. The brain is normal in signal elsewhere. Vascular: Major intracranial vascular flow voids are preserved. Skull and upper cervical spine: Left frontal craniotomy with overlying scalp fluid. Sinuses/Orbits: Unremarkable orbits. Minimal left ethmoid air cell mucosal thickening. Clear mastoid air cells. Other: None. IMPRESSION: Postoperative changes from interval left frontal lobe tumor resection. Small amount of nonenhancing T2 hyperintensity adjacent to the inferior aspect of the cavity; attention on follow-up to assess for residual tumor. Electronically Signed   By: Logan Bores M.D.   On: 04/04/2020 20:56   MR Brain W and Wo Contrast  Result Date: 04/02/2020 CLINICAL DATA:  Seizure EXAM: MRI HEAD WITHOUT AND WITH CONTRAST TECHNIQUE: Multiplanar, multiecho pulse sequences of the brain and surrounding structures were obtained without and with intravenous contrast. CONTRAST:  80mL GADAVIST GADOBUTROL 1 MMOL/ML IV SOLN COMPARISON:  Head CT 04/02/2020 FINDINGS: Brain: There is a large area of abnormal signal intensity in the left frontal lobe. At the central, superior aspect of this region is a contrast-enhancing nodule that measures approximately 11 x 8 mm. There are other areas of faint contrast enhancement more inferiorly. No hemorrhage.  Hyperintense T2-weighted signal fills most of the anterior left frontal white matter. Otherwise, white matter signal is normal. There is rightward bulging of the falx cerebri without subfalcine herniation. No hydrocephalus. The hippocampi are normal and symmetric in size and signal. The hypothalamus and mamillary bodies are normal. Vascular: Major flow voids are preserved. Skull and upper cervical spine: Normal calvarium and skull base. Visualized upper cervical spine and soft tissues are normal. Sinuses/Orbits:No paranasal sinus fluid levels or advanced mucosal thickening. No mastoid or middle ear effusion. Normal orbits. IMPRESSION: 1. Left frontal mass with multiple areas of faint nodular contrast enhancement and large area of surrounding abnormal signal intensity, most consistent with anaplastic astrocytoma. Electronically Signed  By: Ulyses Jarred M.D.   On: 04/02/2020 23:34   EEG adult  Result Date: 04/03/2020 Lora Havens, MD     04/03/2020 10:37 AM Patient Name: Gordon [name] MRN: [id number] Epilepsy Attending: Lora Havens Referring Physician/Provider: Dr Kerney Elbe Date: 04/03/2020 Duration: 30.22 mins Patient history: 25 year old male with first time seizure. EEG to evaluate for seizure Level of alertness: Awake AEDs during EEG study: LEV Technical aspects: This EEG study was done with scalp electrodes positioned according to the 10-20 International system of electrode placement. Electrical activity was acquired at a sampling rate of 500Hz  and reviewed with a high frequency filter of 70Hz  and a low frequency filter of 1Hz . EEG data were recorded continuously and digitally stored. Description: The posterior dominant rhythm consists of 9-10 Hz activity of moderate voltage (25-35 uV) seen predominantly in posterior head regions, symmetric and reactive to eye opening and eye closing. EEG showed intermittent let frontotemporal 3 to 6 Hz theta-delta slowing. Sharp waves were also seen in  left frontotemporal region. Hyperventilation did not show any EEG change.  Physiologic photic driving was seen during photic stimulation.  ABNORMALITY - Sharp waves, left frontotemporal region -Intermittent slow, left frontotemporal region IMPRESSION: This study showed evidence of potential epileptogenicity and cortical dysfunction arising from left frontotemporal region likely secondary to underlying tumor. No seizureswere seen throughout the recording. Priyanka Barbra Sarks    ASSESSMENT & PLAN:  Left Frontal Mass Focal Seizure  Gordon [name] presents with clinical and radiographic syndrome localizing to the left frontal lobe.  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  Quality of surgery was good, with results reviewed in brain tumor board this AM.  He has no deficits from surgery aside from mild sedation and agnosia partly from anesthesia.  He would likely benefit from short course of dexamethasone, 4mg  BIDx 3 days, then 4mg  daily x3 days, then 2mg  daily x3 days, then stop.  Will defer to [name] on formal steroid orders and post-op taper.  We will follow up with him after discharge to review path and adjust AED and steroids as needed.  All questions were answered. The patient knows to call the clinic with any problems, questions or concerns.  The total time spent in the encounter was 55 minutes and more than 50% was on counseling and review of test results     Ventura Sellers, MD 04/05/2020 3:40 PM

## 2020-04-05 NOTE — Progress Notes (Signed)
Occupational Therapy Evaluation Patient Details Name: Gordon [name] MRN: [id number] DOB: 1995-12-05 Today's Date: 04/05/2020    History of Present Illness Pt is a 25 y.o. M admitted 3/11 with first time seizure localizing to left frontal lobe. CTH/MRI showing underlying mass consistent with primary brain tumor. S/p left frontal craniotomy for tumor resection 3/13. No significant PMH.   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 Pt unable to describe his job duties. Session limited by pain. Pt requiring increased time for problem solving and demonstrates difficulty with selective attention. Will follow acutely to further assess DC needs. At this time, recommend outpt OT to facilitate safe return to work and school.      Follow Up Recommendations  Outpatient OT;Supervision - Intermittent    Equipment Recommendations  Tub/shower seat    Recommendations for Other Services       Precautions / Restrictions Precautions Precautions: Fall Restrictions Weight Bearing Restrictions: No      Mobility Bed Mobility Overal bed mobility: Independent                  Transfers Overall transfer level: Needs assistance Equipment used: None Transfers: Sit to/from Stand Sit to Stand: Supervision         General transfer comment: Supervision to rise from EOB    Balance Overall balance assessment: Needs assistance Sitting-balance support: Feet supported Sitting balance-Leahy Scale: Normal     Standing balance support: No upper extremity supported;During functional activity Standing balance-Leahy Scale: Fair                             ADL either performed or assessed with clinical judgement   ADL Overall ADL's : Needs assistance/impaired Eating/Feeding: Set up   Grooming: Set up;Sitting   Upper Body Bathing: Set up;Sitting   Lower Body Bathing: Minimal  assistance   Upper Body Dressing : Set up;Sitting   Lower Body Dressing: Minimal assistance;Sit to/from stand                 General ADL Comments: Pt limited by pain; Minguard with mobility     Vision Baseline Vision/History: Wears glasses Additional Comments: no complaints of visual changes; will further assess     Perception     Praxis      Pertinent Vitals/Pain Pain Assessment: 0-10 Pain Score: 8  Pain Location: headache Pain Descriptors / Indicators: Headache Pain Intervention(s): Limited activity within patient's tolerance     Hand Dominance     Extremity/Trunk Assessment Upper Extremity Assessment Upper Extremity Assessment: Overall WFL for tasks assessed   Lower Extremity Assessment - defer to PT Cervical / Trunk Assessment Cervical / Trunk Assessment: Normal   Communication Communication Communication: Other (comment) (had difficulty finding words at times/may be related to attention/pain)   Cognition Arousal/Alertness: Awake/alert Behavior During Therapy: Flat affect Overall Cognitive Status: Impaired/Different from baseline Area of Impairment: Problem solving;Safety/judgement;Attention;Memory;Awareness                   Current Attention Level: Selective Memory: Decreased short-term memory   Safety/Judgement: Decreased awareness of safety;Decreased awareness of deficits Awareness: Emergent Problem Solving: Slow processing General Comments: Flat affect, slower processing times, unable to begin sequential subtraction; difficulty completing thoughts/sentences; ? wrod finding deficits at [facility]   General Comments       Exercises     Shoulder Instructions  Home Living Family/patient expects to be discharged to:: Private residence Living Arrangements: Parent (mother) Available Help at Discharge: Family;Available 24 hours/day Type of Home: House Home Access: Stairs to enter [employer] of Steps: 3   Home Layout: Able  to live on main level with bedroom/bathroom     Bathroom Shower/Tub: Teacher, early years/pre: Standard Bathroom Accessibility: Yes How Accessible: Accessible via walker Home Equipment: None          Prior Functioning/Environment Level of Independence: Independent        Comments: Development worker, community in business admin, enjoys collecting [employer]; likes to "hand out"        OT Problem List: Decreased activity tolerance;Impaired balance (sitting and/or standing);Decreased cognition;Decreased safety awareness;Decreased knowledge of use of DME or AE;Pain      OT Treatment/Interventions: Self-care/ADL training;DME and/or AE instruction;Therapeutic activities;Cognitive remediation/compensation;Patient/family education;Balance training    OT Goals(Current goals can be found in the care plan section) Acute Rehab OT Goals Patient Stated Goal: Per Dad for his son to get better OT Goal Formulation: With patient/family Time For Goal Achievement: 04/19/20 Potential to Achieve Goals: Good  OT Frequency: Min 2X/week   Barriers to D/C:            Co-evaluation              AM-PAC OT "6 Clicks" Daily Activity     Outcome Measure Help from another person eating meals?: A Little Help from another person taking care of personal grooming?: A Little Help from another person toileting, which includes using toliet, bedpan, or urinal?: A Little Help from another person bathing (including washing, rinsing, drying)?: A Little Help from another person to put on and taking off regular upper body clothing?: A Little Help from another person to put on and taking off regular lower body clothing?: A Little 6 Click Score: 18   End of Session Nurse Communication: Mobility status;Patient requests pain meds  Activity Tolerance: Patient limited by pain Patient left: in bed;with call bell/phone within reach;with family/visitor present  OT Visit Diagnosis: Unsteadiness on feet  (R26.81);Other symptoms and signs involving cognitive function;Pain Pain - part of body:  (head)                Time: 1130-1150 OT Time Calculation (min): 20 min Charges:  OT General Charges $OT Visit: 1 Visit OT Evaluation $OT Eval Moderate Complexity: Paden, OT/L   Acute OT Clinical Specialist [place] Pager [phone] Office [phone]   [facility] 04/05/2020, 3:54 PM

## 2020-04-05 NOTE — Evaluation (Signed)
Physical Therapy Evaluation Patient Details Name: Gordon [name] MRN: [id number] DOB: 26-Jun-1995 Today's Date: 04/05/2020   History of Present Illness  Pt is a 25 y.o. M admitted 3/11 with first time seizure localizing to left frontal lobe. CTH/MRI showing underlying mass consistent with primary brain tumor. S/p left frontal craniotomy for tumor resection 3/13. No significant PMH.  Clinical Impression  Prior to admission, pt lives with his mother and is a Archivist in business administration. Pt ambulating x 250 feet with no assistive device at a min guard assist level. Demonstrates dynamic balance deficits, decreased awareness of deficits, and requires increased time for processing. Suspect pt will progress well with adequate pain control; will continue to follow for higher level balance training. See below for recommendations.      Follow Up Recommendations Outpatient PT;Supervision/Assistance - 24 hour    Equipment Recommendations  None recommended by PT    Recommendations for Other Services       Precautions / Restrictions Precautions Precautions: Fall Restrictions Weight Bearing Restrictions: No      Mobility  Bed Mobility Overal bed mobility: Independent                  Transfers Overall transfer level: Needs assistance Equipment used: None Transfers: Sit to/from Stand Sit to Stand: Supervision         General transfer comment: Supervision to rise from EOB  Ambulation/Gait Ambulation/Gait assistance: Min guard Gait Distance (Feet): 250 Feet Assistive device: None Gait Pattern/deviations: Step-through pattern;Narrow base of support;Scissoring Gait velocity: decreased   General Gait Details: Min guard for safety, mild dynamic instability and occasional scissoring  Stairs            Wheelchair Mobility    Modified Rankin (Stroke Patients Only)       Balance Overall balance assessment: Needs assistance Sitting-balance  support: Feet supported Sitting balance-Leahy Scale: Normal     Standing balance support: No upper extremity supported;During functional activity Standing balance-Leahy Scale: Fair                               Pertinent Vitals/Pain Pain Assessment: 0-10 Pain Score: 8  Pain Location: headache Pain Descriptors / Indicators: Headache Pain Intervention(s): Limited activity within patient's tolerance;Monitored during session;Patient requesting pain meds-RN notified    Home Living Family/patient expects to be discharged to:: Private residence Living Arrangements: Parent (mother) Available Help at Discharge: Family;Available 24 hours/day Type of Home: House Home Access: Stairs to enter   [employer] of Steps: 3 Home Layout: Able to live on main level with bedroom/bathroom        Prior Function Level of Independence: Independent         Comments: Development worker, community in Engineer, materials, enjoys collecting [employer] memorabilia     Hand Dominance        Extremity/Trunk Assessment   Upper Extremity Assessment Upper Extremity Assessment: Defer to OT evaluation    Lower Extremity Assessment Lower Extremity Assessment: RLE deficits/detail;LLE deficits/detail RLE Deficits / Details: Strength 5/5 LLE Deficits / Details: Strength 5/5    Cervical / Trunk Assessment Cervical / Trunk Assessment: Normal  Communication   Communication: No difficulties  Cognition Arousal/Alertness: Awake/alert Behavior During Therapy: Flat affect Overall Cognitive Status: Impaired/Different from baseline Area of Impairment: Problem solving;Safety/judgement                         Safety/Judgement: Decreased awareness  of safety;Decreased awareness of deficits   Problem Solving: Slow processing General Comments: Flat affect, slower processing times, needs some questions repeated, A&Ox4. Pt able to locate room numbers and name 3 animals with letter "A."      General  Comments  VSS    Exercises     Assessment/Plan    PT Assessment Patient needs continued PT services  PT Problem List Decreased balance;Decreased mobility;Decreased cognition;Decreased safety awareness;Pain       PT Treatment Interventions Gait training;Stair training;Functional mobility training;Therapeutic activities;Therapeutic exercise;Balance training;Patient/family education    PT Goals (Current goals can be found in the Care Plan section)  Acute Rehab PT Goals Patient Stated Goal: did not state PT Goal Formulation: With patient Time For Goal Achievement: 04/19/20 Potential to Achieve Goals: Good    Frequency Min 4X/week   Barriers to discharge        Co-evaluation               AM-PAC PT "6 Clicks" Mobility  Outcome Measure Help needed turning from your back to your side while in a flat bed without using bedrails?: None Help needed moving from lying on your back to sitting on the side of a flat bed without using bedrails?: None Help needed moving to and from a bed to a chair (including a wheelchair)?: A Little Help needed standing up from a chair using your arms (e.g., wheelchair or bedside chair)?: None Help needed to walk in hospital room?: A Little Help needed climbing 3-5 steps with a railing? : A Little 6 Click Score: 21    End of Session Equipment Utilized During Treatment: Gait belt Activity Tolerance: Patient tolerated treatment well Patient left: in bed;with call bell/phone within reach;with bed alarm set;with family/visitor present Nurse Communication: Mobility status PT Visit Diagnosis: Unsteadiness on feet (R26.81)    Time: 3832-9191 PT Time Calculation (min) (ACUTE ONLY): 25 min   Charges:   PT Evaluation $PT Eval Moderate Complexity: 1 Mod PT Treatments $Therapeutic Activity: 8-22 mins        Wyona Almas, PT, DPT Acute Rehabilitation Services Pager [phone] Office [phone]   Deno Etienne 04/05/2020, 1:08  PM

## 2020-04-05 NOTE — Progress Notes (Signed)
Neurosurgery Service Progress Note  Subjective: No acute events overnight, feels wiped out, no new weakness or numbness, no seizures, no changes in speech quality, just decreased output  Objective: Vitals:   04/05/20 0300 04/05/20 0400 04/05/20 0500 04/05/20 0600  BP: 111/62 126/70 122/63 126/63  Pulse: 86 90 79 95  Resp: (!) 21 20 10 11   Temp:  98.3 F (36.8 C)    TempSrc:  Oral    SpO2: 99% 98% 100% 97%  Weight:      Height:        Physical Exam: Mildly somnolent - falls asleep after 30 seconds or so after talking, but Ox3, PERRL, EOMI, FS, TM, Strength 5/5 x4, SILTx4, speech fluent with normal content, no drift  Assessment & Plan: 25 y.o. man p/w complex sz localizing to left frontal region, CT/MRI confirmed left frontal brain mass, 3/13 s/p craniotomy for resection of L F mass,3/13 MRI w/ post-op changes, good resection, possible small remnant inferiorly  -Dr. Mickeal Skinner to come evaluate the patient early this afternoon -transfer to regular floor bed -cont keppra 750bid, change IV to po -activity and diet as tolerated -PT/OT eval today -SCDs, TEDs, hold SQH until POD2  [name]  04/05/20 7:22 AM

## 2020-04-06 ENCOUNTER — Other Ambulatory Visit: Payer: Self-pay | Admitting: Radiation Therapy

## 2020-04-06 MED ORDER — LEVETIRACETAM 750 MG PO TABS: 750.0000 mg | ORAL_TABLET | Freq: Two times a day (BID) | ORAL | 5 refills | Status: DC

## 2020-04-06 MED ORDER — HYDROCODONE-ACETAMINOPHEN 5-325 MG PO TABS: 1.0000 | ORAL_TABLET | ORAL | 0 refills | Status: DC | PRN

## 2020-04-06 MED ORDER — DEXAMETHASONE 2 MG PO TABS: 4.0000 mg | ORAL_TABLET | Freq: Every day | ORAL | 0 refills | Status: DC

## 2020-04-06 NOTE — Progress Notes (Signed)
Neurosurgery Service Progress Note  Subjective: No acute events overnight, feels better today, no new complaints  Objective: Vitals:   04/05/20 1300 04/05/20 1507 04/05/20 2111 04/06/20 0543  BP: 130/69 125/63 135/62 134/73  Pulse:  70 75 68  Resp: 19 16 18 16   Temp:  98.6 F (37 C) 98.6 F (37 C) 99.8 F (37.7 C)  TempSrc:  Oral  Oral  SpO2:  96% 99% 97%  Weight:  97.5 kg    Height:  6\' 3"  (1.905 m)      Physical Exam: Awake/alert Ox3, PERRL, EOMI, FS, TM, Strength 5/5 x4, SILTx4, speech fluent with normal content, no drift  Assessment & Plan: 25 y.o. man p/w complex sz localizing to left frontal region, CT/MRI confirmed left frontal brain mass, 3/13 s/p craniotomy for resection of L F mass,3/13 MRI w/ post-op changes, good resection, possible small remnant inferiorly  -discharge home today on keppra, dex taper -outpt PT/OT  [name]  04/06/20 7:49 AM

## 2020-04-06 NOTE — Care Management Important Message (Signed)
Important Message  Patient Details  Name: Gordon [name] MRN: [id number] Date of Birth: 10-30-1995   Medicare Important Message Given:  Yes     Orbie Pyo 04/06/2020, 2:56 PM

## 2020-04-06 NOTE — Discharge Instructions (Signed)
Discharge Instructions  No restriction in activities, slowly increase your activity back to normal.   Your incision is closed with absorbable sutures. These will naturally fall off over the next 4-6 weeks. If they become bothersome or cause discomfort, apply some antibiotic ointment like bacitracin or neosporin on the sutures. This will soften them up and usually makes them more comfortable while they dissolve.  Okay to shower on the day of discharge. Be gentle when cleaning your incision. Use regular soap and water. If that is uncomfortable, try using baby shampoo. Do not submerge the wound under water for 2 weeks after surgery.  Follow up with [name] in 2 weeks after discharge. If you do not already have a discharge appointment, please call his office at [phone] to schedule a follow up appointment. If you have any concerns or questions, please call the office and let us know.  Do not consume alcohol until discussed at follow up, this increases the likelihood of having a seizure. Continue with seizure precautions - no driving, do not put yourself in a situation where having a seizure could harm yourself or others. Keep taking your levetiracetam (keppra) until you are explicitly told by your doctor to stop taking it.

## 2020-04-06 NOTE — Progress Notes (Signed)
Patient given discharge papers. Education provided to patient and mom. IVs discontinued, site covered with gauze and tape, both catheters intact and placed into sharps. Patient walked downstairs with two family members by his side.

## 2020-04-12 ENCOUNTER — Inpatient Hospital Stay: Payer: 59

## 2020-04-12 ENCOUNTER — Inpatient Hospital Stay: Payer: 59 | Attending: Internal Medicine | Admitting: Internal Medicine

## 2020-04-12 ENCOUNTER — Other Ambulatory Visit: Payer: Self-pay

## 2020-04-12 VITALS — BP 118/58 | HR 63 | Temp 97.9°F | Resp 16 | Ht 75.0 in | Wt 204.5 lb

## 2020-04-12 DIAGNOSIS — R569 Unspecified convulsions: Secondary | ICD-10-CM | POA: Diagnosis not present

## 2020-04-12 DIAGNOSIS — G9389 Other specified disorders of brain: Secondary | ICD-10-CM | POA: Diagnosis not present

## 2020-04-12 DIAGNOSIS — D496 Neoplasm of unspecified behavior of brain: Secondary | ICD-10-CM

## 2020-04-12 DIAGNOSIS — C711 Malignant neoplasm of frontal lobe: Secondary | ICD-10-CM | POA: Diagnosis not present

## 2020-04-12 DIAGNOSIS — Z79899 Other long term (current) drug therapy: Secondary | ICD-10-CM | POA: Insufficient documentation

## 2020-04-12 NOTE — Progress Notes (Signed)
[facility] [address] [place], [place] [address] [phone]   New Patient Evaluation  Date of Service: 04/12/20 Patient Name: Gordon [name] Patient MRN: [id number] Patient DOB: Mar 04, 1995 Provider: Ventura Sellers, MD  Identifying Statement:  Gordon [name] is a 25 y.o. male with left frontal brain mass who presents for initial consultation and evaluation.    Referring Provider: Chesley Noon, MD [place],  [place] [address]  Oncologic History: Oncology History   No history exists.    Biomarkers:  MGMT Unknown.  IDH 1/2 Unknown.  EGFR Unknown  TERT Unknown   History of Present Illness: The patient's records from the referring physician were obtained and reviewed and the patient interviewed to confirm this HPI.  Gordon [name] presented to medical attention three weeks prior with episode of loss of awareness and generalized shaking consistent with seizure episode.  CNS imaging demonstrated an enhancing left frontal mass, which was resected on 04/04/20 by [name].  He tolerated surgery well, has no significant complaints at this time.  Last day of decadron taper is today.  He has been more or less independent at home, starting to resume some of his studies at school.  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.  No other significant medical history.  Medications: Current Outpatient Medications on File Prior to Visit  Medication Sig Dispense Refill  . dexamethasone (DECADRON) 2 MG tablet Take 2 tablets (4 mg total) by mouth daily. Take 2 tablets (24m total) by mouth daily for 3 days, then take 1 tablet (243m by mouth daily for 3 days, then stop taking. 20 tablet 0  . levETIRAcetam (KEPPRA) 750 MG tablet Take 1 tablet (750 mg total) by mouth 2 (two) times daily. 60 tablet 5  . Naproxen Sodium (ALEVE) 220 MG CAPS Take by mouth.    . Marland KitchenYDROcodone-acetaminophen (NORCO/VICODIN) 5-325 MG tablet Take 1  tablet by mouth every 4 (four) hours as needed for moderate pain. (Patient not taking: Reported on 04/12/2020) 30 tablet 0   No current facility-administered medications on file prior to visit.    Allergies: No Known Allergies Past Medical History:  Past Medical History:  Diagnosis Date  . Bulging lumbar disc   . Seizure ([facility]12[facility]   x 1 at home pre-admission.  Otherwise, no history.   Past Surgical History:  Past Surgical History:  Procedure Laterality Date  . CRANIOTOMY Left 04/04/2020   Procedure: LEFT CRANIOTOMY FOR TUMOR EXCISION;  Surgeon: OsJudith PartMD;  [facility];  Laterality: Left;  . TONSILLECTOMY    . WISDOM TOOTH EXTRACTION     Social History:  Social History   Socioeconomic History  . Marital status: Single    Spouse name: Not on file  . Number of children: 0  . Years of education: Not on file  . Highest education level: Not on file  Occupational History  . Not on file  Tobacco Use  . Smoking status: Never Smoker  . Smokeless tobacco: Never Used  Vaping Use  . Vaping Use: Never used  Substance and Sexual Activity  . Alcohol use: No    Alcohol/week: 0.0 standard drinks  . Drug use: No  . Sexual activity: Yes    Birth control/protection: Condom  Other Topics Concern  . Not on file  Social History Narrative  . Not on file   Social Determinants of Health   Financial Resource Strain: Not  on file  Food Insecurity: Not on file  Transportation Needs: Not on file  Physical Activity: Not on file  Stress: Not on file  Social Connections: Not on file  Intimate Partner Violence: Not on file   Family History: No family history on file.  Review of Systems: Constitutional: Doesn't report fevers, chills or abnormal weight loss Eyes: Doesn't report blurriness of vision Ears, nose, mouth, throat, and face: Doesn't report sore throat Respiratory: Doesn't report cough, dyspnea or wheezes Cardiovascular: Doesn't  report palpitation, chest discomfort  Gastrointestinal:  Doesn't report nausea, constipation, diarrhea GU: Doesn't report incontinence Skin: Doesn't report skin rashes Neurological: Per HPI Musculoskeletal: Doesn't report joint pain Behavioral/Psych: Doesn't report anxiety  Physical Exam: Vitals:   04/12/20 1037  BP: (!) 118/58  Pulse: 63  Resp: 16  Temp: 97.9 F (36.6 C)  SpO2: 100%   KPS: 90. General: Alert, cooperative, pleasant, in no acute distress Head: Normal EENT: No conjunctival injection or scleral icterus.  Lungs: Resp effort normal Cardiac: Regular rate Abdomen: Non-distended abdomen Skin: No rashes cyanosis or petechiae. Extremities: No clubbing or edema  Neurologic Exam: Mental Status: Awake, alert, attentive to examiner. Oriented to self and environment. Language is fluent with intact comprehension.  Cranial Nerves: Visual acuity is grossly normal. Visual fields are full. Extra-ocular movements intact. No ptosis. Face is symmetric Motor: Tone and bulk are normal. Power is full in both arms and legs. Reflexes are symmetric, no pathologic reflexes present.  Sensory: Intact to light touch Gait: Normal.   Labs: I have reviewed the data as listed    Component Value Date/Time   NA 137 04/03/2020 0728   K 4.4 04/03/2020 0728   CL 107 04/03/2020 0728   CO2 25 04/03/2020 0728   GLUCOSE 111 (H) 04/03/2020 0728   BUN 9 04/03/2020 0728   CREATININE 0.85 04/04/2020 1303   CALCIUM 9.7 04/03/2020 0728   PROT 6.9 04/03/2020 0728   ALBUMIN 4.2 04/03/2020 0728   AST 21 04/03/2020 0728   ALT 15 04/03/2020 0728   ALKPHOS 43 04/03/2020 0728   BILITOT 0.9 04/03/2020 0728   GFRNONAA >60 04/04/2020 1303   GFRAA  08/10/2009 2150    NOT CALCULATED        The eGFR has been calculated using the MDRD equation. This calculation has not been validated in all clinical situations. eGFR's persistently <60 mL/min signify possible Chronic Kidney Disease.   Lab Results   Component Value Date   WBC 11.7 (H) 04/04/2020   NEUTROABS 9.7 (H) 04/02/2020   HGB 13.4 04/04/2020   HCT 38.3 (L) 04/04/2020   MCV 86.5 04/04/2020   PLT 276 04/04/2020    Imaging:  CT Head Wo Contrast  Result Date: 04/02/2020 CLINICAL DATA:  Seizure, nontraumatic (Age 36-40y) EXAM: CT HEAD WITHOUT CONTRAST TECHNIQUE: Contiguous axial images were obtained from the base of the skull through the vertex without intravenous contrast. COMPARISON:  None. FINDINGS: Brain: Ill-defined area of cortical and subcortical low-density involving the left frontal lobe, for example series 2, image 22. The may be mild surrounding edema. Low-density extends to the frontal horn of the left lateral ventricle. No hemorrhage. Trace left right midline shift. Normal ventricular size without hydrocephalus. Basilar cisterns are patent. Vascular: No hyperdense vessel. Skull: No fracture or focal lesion. Sinuses/Orbits: Minimal opacification of left ethmoid air cell. Otherwise clear paranasal sinuses. Mastoid air cells are well aerated. Included orbits are unremarkable. Other: None. IMPRESSION: 1. Ill-defined area of cortical and subcortical low-density involving the left frontal  lobe with questionable mild surrounding edema and trace left-to-right midline shift. Favor neoplasm, with differential of infection or infarct. Recommend further evaluation with MRI, without and with IV contrast. 2. No hemorrhage or midline shift. These results were called by telephone at the time of interpretation on 04/02/2020 at 6:29 pm to provider [facility] , who verbally acknowledged these results. Electronically Signed   By: Keith Rake M.D.   On: 04/02/2020 18:29   MR BRAIN W WO CONTRAST  Result Date: 04/04/2020 CLINICAL DATA:  Postop day 0 from left frontal tumor resection. EXAM: MRI HEAD WITHOUT AND WITH CONTRAST TECHNIQUE: Multiplanar, multiecho pulse sequences of the brain and surrounding structures were obtained without and with  intravenous contrast. CONTRAST:  27m GADAVIST GADOBUTROL 1 MMOL/ML IV SOLN COMPARISON:  04/02/2020 FINDINGS: Brain: Sequelae of interval left frontal craniotomy and tumor resection are identified. There is a resection cavity medially in the left frontal lobe containing blood products. Pneumocephalus is noted along with a small amount of extra-axial fluid subjacent to the craniotomy, and there is mild mass effect on the left frontal horn without midline shift or hydrocephalus. There is a small amount of T2 FLAIR hyperintensity adjacent to the inferior aspect of the resection cavity, most notable anteriorly (series 7, image 35) for which a very small amount of residual tumor is possible. No suspicious enhancement is identified. Postoperative smooth dural enhancement is noted. There is no acute infarct. The brain is normal in signal elsewhere. Vascular: Major intracranial vascular flow voids are preserved. Skull and upper cervical spine: Left frontal craniotomy with overlying scalp fluid. Sinuses/Orbits: Unremarkable orbits. Minimal left ethmoid air cell mucosal thickening. Clear mastoid air cells. Other: None. IMPRESSION: Postoperative changes from interval left frontal lobe tumor resection. Small amount of nonenhancing T2 hyperintensity adjacent to the inferior aspect of the cavity; attention on follow-up to assess for residual tumor. Electronically Signed   By: ALogan BoresM.D.   On: 04/04/2020 20:56   MR Brain W and Wo Contrast  Result Date: 04/02/2020 CLINICAL DATA:  Seizure EXAM: MRI HEAD WITHOUT AND WITH CONTRAST TECHNIQUE: Multiplanar, multiecho pulse sequences of the brain and surrounding structures were obtained without and with intravenous contrast. CONTRAST:  146mGADAVIST GADOBUTROL 1 MMOL/ML IV SOLN COMPARISON:  Head CT 04/02/2020 FINDINGS: Brain: There is a large area of abnormal signal intensity in the left frontal lobe. At the central, superior aspect of this region is a contrast-enhancing nodule  that measures approximately 11 x 8 mm. There are other areas of faint contrast enhancement more inferiorly. No hemorrhage. Hyperintense T2-weighted signal fills most of the anterior left frontal white matter. Otherwise, white matter signal is normal. There is rightward bulging of the falx cerebri without subfalcine herniation. No hydrocephalus. The hippocampi are normal and symmetric in size and signal. The hypothalamus and mamillary bodies are normal. Vascular: Major flow voids are preserved. Skull and upper cervical spine: Normal calvarium and skull base. Visualized upper cervical spine and soft tissues are normal. Sinuses/Orbits:No paranasal sinus fluid levels or advanced mucosal thickening. No mastoid or middle ear effusion. Normal orbits. IMPRESSION: 1. Left frontal mass with multiple areas of faint nodular contrast enhancement and large area of surrounding abnormal signal intensity, most consistent with anaplastic astrocytoma. Electronically Signed   By: KeUlyses Jarred.D.   On: 04/02/2020 23:34   EEG adult  Result Date: 04/03/2020 YaLora HavensMD     04/03/2020 10:37 AM Patient Name: AnCoury GriegerRN: [id number] Attending: PrLora Havenseferring Physician/Provider:  Dr Kerney Elbe Date: 04/03/2020 Duration: 30.22 mins Patient history: 25 year old male with first time seizure. EEG to evaluate for seizure Level of alertness: Awake AEDs during EEG study: LEV Technical aspects: This EEG study was done with scalp electrodes positioned according to the 10-20 International system of electrode placement. Electrical activity was acquired at a sampling rate of _0  and reviewed with a high frequency filter of _1  and a low frequency filter of _2 . EEG data were recorded continuously and digitally stored. Description: The posterior dominant rhythm consists of 9-10 Hz activity of moderate voltage (25-35 uV) seen predominantly in posterior head regions, symmetric and reactive to eye opening  and eye closing. EEG showed intermittent let frontotemporal 3 to 6 Hz theta-delta slowing. Sharp waves were also seen in left frontotemporal region. Hyperventilation did not show any EEG change.  Physiologic photic driving was seen during photic stimulation.  ABNORMALITY - Sharp waves, left frontotemporal region -Intermittent slow, left frontotemporal region IMPRESSION: This study showed evidence of potential epileptogenicity and cortical dysfunction arising from left frontotemporal region likely secondary to underlying tumor. No seizureswere seen throughout the recording. Lora Havens    Pathology: pending final  Assessment/Plan Brain tumor [facility]) [D49.6]  We appreciate the opportunity to participate in the care of Gordon [name], who presents today with clinical and radiographic syndrome localizing to the left frontal lobe.  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  Quality of surgery was good, with results reviewed again in brain tumor board this AM.  He has no deficits from surgery aside from some fatigue, mental dullness.  We had an extensive conversation with him and his mother regarding suspected pathologies, prognoses, and available treatment pathways for brain tumor.  They understand this could include radiation or chemotherapy.  We also discussed epilepsy safety, driving restrictions.  He will continue Keppra 763m BID for now.    May stay off of decadron if tolerated.  The patient is not a candidate for a research protocol at this time due to path pending.   We spent twenty additional minutes teaching regarding the natural history, biology, and historical experience in the treatment of brain tumors. We then discussed in detail the current recommendations for therapy focusing on the mode of administration, mechanism of action, anticipated toxicities, and quality of life issues associated with this plan. We also provided teaching sheets for the patient to take home as an  additional resource.  AJaquavion [name] will return to clinic with family for pathology discussion once it is finalized.  All questions were answered. The patient knows to call the clinic with any problems, questions or concerns. No barriers to learning were detected.  The total time spent in the encounter was 60 minutes and more than 50% was on counseling and review of test results   ZVentura Sellers MD Medical Director of Neuro-Oncology [facility]at WAmesti03/21/22 10:42 AM

## 2020-04-19 ENCOUNTER — Other Ambulatory Visit: Payer: Self-pay

## 2020-04-19 ENCOUNTER — Inpatient Hospital Stay (HOSPITAL_COMMUNITY)
Admission: EM | Admit: 2020-04-19 | Discharge: 2020-04-22 | DRG: 858 | Disposition: A | Discharge status: Home / Self Care | Payer: 59 | Attending: Neurological Surgery | Admitting: Neurological Surgery

## 2020-04-19 ENCOUNTER — Encounter (HOSPITAL_COMMUNITY): Payer: Self-pay | Admitting: Neurological Surgery

## 2020-04-19 ENCOUNTER — Inpatient Hospital Stay: Payer: 59

## 2020-04-19 DIAGNOSIS — Z20822 Contact with and (suspected) exposure to covid-19: Secondary | ICD-10-CM | POA: Diagnosis present

## 2020-04-19 DIAGNOSIS — T8141XA Infection following a procedure, superficial incisional surgical site, initial encounter: Principal | ICD-10-CM | POA: Diagnosis present

## 2020-04-19 DIAGNOSIS — Y848 Other medical procedures as the cause of abnormal reaction of the patient, or of later complication, without mention of misadventure at the time of the procedure: Secondary | ICD-10-CM | POA: Diagnosis present

## 2020-04-19 DIAGNOSIS — T8149XA Infection following a procedure, other surgical site, initial encounter: Secondary | ICD-10-CM | POA: Diagnosis present

## 2020-04-19 DIAGNOSIS — B9561 Methicillin susceptible Staphylococcus aureus infection as the cause of diseases classified elsewhere: Secondary | ICD-10-CM | POA: Diagnosis present

## 2020-04-19 HISTORY — DX: Other complications of anesthesia, initial encounter: T88.59XA

## 2020-04-19 HISTORY — DX: Other specified postprocedural states: R11.2

## 2020-04-19 HISTORY — DX: Nausea with vomiting, unspecified: Z98.890

## 2020-04-19 LAB — LACTIC ACID, PLASMA
Lactic Acid, Venous: 0.8 mmol/L (ref 0.5–1.9)
Lactic Acid, Venous: 0.7 mmol/L (ref 0.5–1.9)

## 2020-04-19 LAB — CBC WITH DIFFERENTIAL/PLATELET
Abs Immature Granulocytes: 0.04 10*3/uL (ref 0.00–0.07)
Basophils Absolute: 0.1 10*3/uL (ref 0.0–0.1)
Basophils Relative: 1 %
Eosinophils Absolute: 0.2 10*3/uL (ref 0.0–0.5)
Eosinophils Relative: 2 %
HCT: 41.3 % (ref 39.0–52.0)
Hemoglobin: 13.7 g/dL (ref 13.0–17.0)
Immature Granulocytes: 1 %
Lymphocytes Relative: 21 %
Lymphs Abs: 1.8 10*3/uL (ref 0.7–4.0)
MCH: 29.8 pg (ref 26.0–34.0)
MCHC: 33.2 g/dL (ref 30.0–36.0)
MCV: 89.8 fL (ref 80.0–100.0)
Monocytes Absolute: 0.7 10*3/uL (ref 0.1–1.0)
Monocytes Relative: 8 %
Neutro Abs: 5.7 10*3/uL (ref 1.7–7.7)
Neutrophils Relative %: 67 %
Platelets: 393 10*3/uL (ref 150–400)
RBC: 4.6 MIL/uL (ref 4.22–5.81)
RDW: 12.2 % (ref 11.5–15.5)
WBC: 8.4 10*3/uL (ref 4.0–10.5)
nRBC: 0 % (ref 0.0–0.2)

## 2020-04-19 LAB — COMPREHENSIVE METABOLIC PANEL
ALT: 22 U/L (ref 0–44)
AST: 18 U/L (ref 15–41)
Albumin: 3.9 g/dL (ref 3.5–5.0)
Alkaline Phosphatase: 44 U/L (ref 38–126)
Anion gap: 9 (ref 5–15)
BUN: 12 mg/dL (ref 6–20)
CO2: 28 mmol/L (ref 22–32)
Calcium: 10 mg/dL (ref 8.9–10.3)
Chloride: 101 mmol/L (ref 98–111)
Creatinine, Ser: 0.88 mg/dL (ref 0.61–1.24)
GFR, Estimated: >60 mL/min (ref >60)
Glucose, Bld: 85 mg/dL (ref 70–99)
Potassium: 4.6 mmol/L (ref 3.5–5.1)
Sodium: 138 mmol/L (ref 135–145)
Total Bilirubin: 0.7 mg/dL (ref 0.3–1.2)
Total Protein: 7.8 g/dL (ref 6.5–8.1)

## 2020-04-19 LAB — RESP PANEL BY RT-PCR (FLU A&B, COVID) ARPGX2
Influenza A by PCR: NEGATIVE
Influenza B by PCR: NEGATIVE
SARS Coronavirus 2 by RT PCR: NEGATIVE

## 2020-04-19 MED ORDER — ONDANSETRON HCL 4 MG PO TABS: 4.0000 mg | ORAL_TABLET | ORAL | Status: DC | PRN

## 2020-04-19 MED ORDER — POLYETHYLENE GLYCOL 3350 17 G PO PACK: 17.0000 g | PACK | Freq: Every day | ORAL | Status: DC | PRN

## 2020-04-19 MED ORDER — PROMETHAZINE HCL 25 MG PO TABS: 12.5000 mg | ORAL_TABLET | ORAL | Status: DC | PRN

## 2020-04-19 MED ORDER — ACETAMINOPHEN 650 MG RE SUPP: 650.0000 mg | RECTAL | Status: DC | PRN

## 2020-04-19 MED ORDER — HYDROCODONE-ACETAMINOPHEN 5-325 MG PO TABS: 1.0000 | ORAL_TABLET | ORAL | Status: DC | PRN

## 2020-04-19 MED ORDER — LEVETIRACETAM 750 MG PO TABS
750.0000 mg | ORAL_TABLET | Freq: Two times a day (BID) | ORAL | Status: DC
Start: 2020-04-19 — End: 2020-04-22
  Administered 2020-04-19 – 2020-04-22 (×6): 750 mg via ORAL
  Filled 2020-04-19 (×7): qty 1

## 2020-04-19 MED ORDER — DOCUSATE SODIUM 100 MG PO CAPS
100.0000 mg | ORAL_CAPSULE | Freq: Two times a day (BID) | ORAL | Status: DC
  Administered 2020-04-19 – 2020-04-22 (×7): 100 mg via ORAL
  Filled 2020-04-19 (×6): qty 1

## 2020-04-19 MED ORDER — ACETAMINOPHEN 325 MG PO TABS
650.0000 mg | ORAL_TABLET | ORAL | Status: DC | PRN
  Administered 2020-04-20 (×2): 650 mg via ORAL
  Filled 2020-04-19 (×2): qty 2

## 2020-04-19 MED ORDER — ONDANSETRON HCL 4 MG/2ML IJ SOLN: 4.0000 mg | INTRAMUSCULAR | Status: DC | PRN

## 2020-04-19 MED ORDER — HYDROMORPHONE HCL 1 MG/ML IJ SOLN: 0.5000 mg | INTRAMUSCULAR | Status: DC | PRN

## 2020-04-19 NOTE — ED Triage Notes (Signed)
Reported had a tumor removal 2 weeks ago, c/o drainage on incision site (top of the head), c/o fever as well.

## 2020-04-19 NOTE — ED Notes (Signed)
4 N unable to take report

## 2020-04-19 NOTE — H&P (Signed)
Neurosurgery H&P  CC: Wound drainage  HPI: This is a 25 y.o. man that presents after craniotomy and tumor resection 04/04/20. He had some new clear drainage from his wound that became purulent in the past day or two. Occasional fevers, otherwise feeling well. No breakthrough seizures or new neurologic complaints.    ROS: A 14 point ROS was performed and is negative except as noted in the HPI.   PMHx:  Past Medical History:  Diagnosis Date  . Bulging lumbar disc   . Seizure ([place]) 04/03/2020   x 1 at home pre-admission.  Otherwise, no history.   FamHx: No family history on file. SocHx:  reports that he has never smoked. He has never used smokeless tobacco. He reports that he does not drink alcohol and does not use drugs.  Exam: Vital signs in last 24 hours: Temp:  [98.3 F (36.8 C)] 98.3 F (36.8 C) (03/28 1021) Pulse Rate:  [87] 87 (03/28 1021) Resp:  [16] 16 (03/28 1021) BP: (138)/(75) 138/75 (03/28 1021) SpO2:  [100 %] 100 % (03/28 1021) General: Awake, alert, cooperative, lying in bed in NAD Head: Normocephalic and atruamatic, purulent drainage from anterior aspect of wound HEENT: Neck supple Pulmonary: breathing room air comfortably, no evidence of increased work of breathing Cardiac: RRR Abdomen: S NT ND Extremities: Warm and well perfused x4 Neuro: AOx3, PERRL, EOMI, FS Strength 5/5 x4, SILTx4   Assessment and Plan: 25 y.o. man now 2 weeks s/p craniotomy for tumor resection with purulent drainage from wound.  -admit to my service -Had a few doses of cephalexin, will hold ABx and take to the OR today for wound washout and get fresh cultures -hold steroids -hold DVT chemoPPx until POD2 -continue home keppra  [name], MD 04/19/20 11:48 AM [facility]

## 2020-04-19 NOTE — ED Provider Notes (Signed)
[facility] EMERGENCY DEPARTMENT Provider Note  CSN: [id number] Arrival date & time: 04/19/20 1015    History Chief Complaint  Patient presents with  . Wound Check    HPI  Gordon [name] is a 25 y.o. 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 He was given Rx for antibiotic (he doesn't recall the name) and told that if his drainage became purulent to come to the ED. He reports he ws running a fever recently as well, doesn't recall how high it was. Denies any headaches, no further seizures, no N/V/D. No new numbness, tingling, weakness in arms or legs. No neck pain/stiffness.    Past Medical History:  Diagnosis Date  . Bulging lumbar disc   . Seizure ([place]) 04/03/2020   x 1 at home pre-admission.  Otherwise, no history.    Past Surgical History:  Procedure Laterality Date  . CRANIOTOMY Left 04/04/2020   Procedure: LEFT CRANIOTOMY FOR TUMOR EXCISION;  Surgeon: [name], MD;  Location: [place];  Service: Neurosurgery;  Laterality: Left;  . TONSILLECTOMY    . WISDOM TOOTH EXTRACTION      No family history on file.  Social History   Tobacco Use  . Smoking status: Never Smoker  . Smokeless tobacco: Never Used  Vaping Use  . Vaping Use: Never used  Substance Use Topics  . Alcohol use: No    Alcohol/week: 0.0 standard drinks  . Drug use: No     Home Medications Prior to Admission medications   Medication Sig Start Date End Date Taking? Authorizing Provider  cephALEXin (KEFLEX) 500 MG capsule Take 500 mg by mouth every 8 (eight) hours. 04/17/20  Yes [provider]  HYDROcodone-acetaminophen (NORCO/VICODIN) 5-325 MG tablet Take 1 tablet by mouth every 4 (four) hours as needed for moderate pain. Patient taking differently: Take 1 tablet by mouth as needed for moderate pain. 04/06/20  Yes  [name], MD  ibuprofen (ADVIL) 200 MG tablet Take 200-400 mg by mouth as needed (pain).   Yes [provider]  levETIRAcetam (KEPPRA) 750 MG tablet Take 1 tablet (750 mg total) by mouth 2 (two) times daily. 04/06/20  Yes [name], MD  dexamethasone (DECADRON) 2 MG tablet Take 2 tablets (4 mg total) by mouth daily. Take 2 tablets (4mg  total) by mouth daily for 3 days, then take 1 tablet (2mg ) by mouth daily for 3 days, then stop taking. Patient not taking: Reported on 04/19/2020 04/06/20   [name], MD     Allergies    Patient has no known allergies.   Review of Systems   Review of Systems A comprehensive review of systems was completed and negative except as noted in HPI.    Physical Exam BP 117/69 (BP Location: Right Arm)   Pulse 66   Temp 99.9 F (37.7 C) (Oral)   Resp 16   SpO2 99%   Physical Exam Vitals and nursing note reviewed.  Constitutional:      Appearance: Normal appearance.  HENT:     Head: Normocephalic and atraumatic.     Comments: Craniotomy scar is clean and dry for the most part, there is a small area just at the hairline that has purulent drainage, minimal surrounding erythema, no focal fluctuance    Nose: Nose normal.     Mouth/Throat:  Mouth: Mucous membranes are moist.  Eyes:     Extraocular Movements: Extraocular movements intact.     Conjunctiva/sclera: Conjunctivae normal.  Cardiovascular:     Rate and Rhythm: Normal rate.  Pulmonary:     Effort: Pulmonary effort is normal.     Breath sounds: Normal breath sounds.  Abdominal:     General: Abdomen is flat.     Palpations: Abdomen is soft.     Tenderness: There is no abdominal tenderness.  Musculoskeletal:        General: No swelling. Normal range of motion.     Cervical back: Neck supple. No rigidity.  Skin:    General: Skin is warm and dry.  Neurological:     General: No focal deficit present.     Mental Status: He is alert. Mental status is at  baseline.  Psychiatric:        Mood and Affect: Mood normal.      ED Results / Procedures / Treatments   Labs (all labs ordered are listed, but only abnormal results are displayed) Labs Reviewed  RESP PANEL BY RT-PCR (FLU A&B, COVID) ARPGX2  CBC WITH DIFFERENTIAL/PLATELET  COMPREHENSIVE METABOLIC PANEL  LACTIC ACID, PLASMA  LACTIC ACID, PLASMA    EKG None  Radiology No results found.  Procedures Procedures  Medications Ordered in the ED Medications  levETIRAcetam (KEPPRA) tablet 750 mg (has no administration in time range)  acetaminophen (TYLENOL) tablet 650 mg (has no administration in time range)    Or  acetaminophen (TYLENOL) suppository 650 mg (has no administration in time range)  HYDROcodone-acetaminophen (NORCO/VICODIN) 5-325 MG per tablet 1 tablet (has no administration in time range)  HYDROmorphone (DILAUDID) injection 0.5 mg (has no administration in time range)  docusate sodium (COLACE) capsule 100 mg (has no administration in time range)  polyethylene glycol (MIRALAX / GLYCOLAX) packet 17 g (has no administration in time range)  ondansetron (ZOFRAN) tablet 4 mg (has no administration in time range)    Or  ondansetron (ZOFRAN) injection 4 mg (has no administration in time range)  promethazine (PHENERGAN) tablet 12.5-25 mg (has no administration in time range)     MDM Rules/Calculators/A&P MDM Small amount of purulent drainage to craniotomy scar. Will check with Neurosurgery to see what additional workup they would like in the ED.   ED Course  I have reviewed the triage vital signs and the nursing notes.  Pertinent labs & imaging results that were available during my care of the patient were reviewed by me and considered in my medical decision making (see chart for details).  Clinical Course as of 04/19/20 1455  Mon Apr 19, 2020  1142 Spoke with [name], Neurosurgery, who will plan to admit for washout. Patient and mother at bedside aware of  plan.  [CS]  5277 CBC, CMP and lactic acid neg. Covid is neg.  [CS]    Clinical Course User Index [CS] Truddie Hidden, MD    Final Clinical Impression(s) / ED Diagnoses Final diagnoses:  Wound infection after surgery    Rx / DC Orders ED Discharge Orders    None       Truddie Hidden, MD 04/19/20 1455

## 2020-04-20 ENCOUNTER — Inpatient Hospital Stay (HOSPITAL_COMMUNITY): Payer: 59 | Admitting: Certified Registered"

## 2020-04-20 ENCOUNTER — Encounter (HOSPITAL_COMMUNITY): Payer: Self-pay | Admitting: Neurological Surgery

## 2020-04-20 ENCOUNTER — Encounter (HOSPITAL_COMMUNITY)
Admission: EM | Disposition: A | Discharge status: Home / Self Care | Payer: Self-pay | Source: Home / Self Care | Attending: Neurological Surgery

## 2020-04-20 HISTORY — PX: BURR HOLE: SHX908

## 2020-04-20 LAB — GLUCOSE, CAPILLARY: Glucose-Capillary: 83 mg/dL (ref 70–99)

## 2020-04-20 SURGERY — CREATION, CRANIAL BURR HOLE
Anesthesia: General | Laterality: Right

## 2020-04-20 MED ORDER — ONDANSETRON HCL 4 MG/2ML IJ SOLN
INTRAMUSCULAR | Status: AC
  Filled 2020-04-20: qty 4

## 2020-04-20 MED ORDER — PHENYLEPHRINE 40 MCG/ML (10ML) SYRINGE FOR IV PUSH (FOR BLOOD PRESSURE SUPPORT)
PREFILLED_SYRINGE | INTRAVENOUS | Status: AC
  Filled 2020-04-20: qty 30

## 2020-04-20 MED ORDER — VANCOMYCIN HCL 2000 MG/400ML IV SOLN
2000.0000 mg | Freq: Once | INTRAVENOUS | Status: DC
  Filled 2020-04-20: qty 400

## 2020-04-20 MED ORDER — MEPERIDINE HCL 25 MG/ML IJ SOLN: 6.2500 mg | INTRAMUSCULAR | Status: DC | PRN

## 2020-04-20 MED ORDER — VANCOMYCIN HCL 1000 MG/200ML IV SOLN
1000.0000 mg | Freq: Once | INTRAVENOUS | Status: AC
  Administered 2020-04-20: 1000 mg via INTRAVENOUS
  Filled 2020-04-20: qty 200

## 2020-04-20 MED ORDER — VANCOMYCIN HCL 1000 MG/200ML IV SOLN
1000.0000 mg | INTRAVENOUS | Status: DC
  Filled 2020-04-20: qty 200

## 2020-04-20 MED ORDER — ROCURONIUM BROMIDE 10 MG/ML (PF) SYRINGE
PREFILLED_SYRINGE | INTRAVENOUS | Status: AC
  Filled 2020-04-20: qty 20

## 2020-04-20 MED ORDER — LIDOCAINE 2% (20 MG/ML) 5 ML SYRINGE
INTRAMUSCULAR | Status: AC
  Filled 2020-04-20: qty 10

## 2020-04-20 MED ORDER — LACTATED RINGERS IV SOLN: INTRAVENOUS | Status: DC

## 2020-04-20 MED ORDER — CHLORHEXIDINE GLUCONATE 0.12 % MT SOLN
OROMUCOSAL | Status: AC
  Administered 2020-04-20: 15 mL via OROMUCOSAL
  Filled 2020-04-20: qty 15

## 2020-04-20 MED ORDER — HYDROMORPHONE HCL 1 MG/ML IJ SOLN
0.2500 mg | INTRAMUSCULAR | Status: DC | PRN
  Administered 2020-04-20: 0.5 mg via INTRAVENOUS

## 2020-04-20 MED ORDER — MIDAZOLAM HCL 2 MG/2ML IJ SOLN
INTRAMUSCULAR | Status: DC | PRN
  Administered 2020-04-20: 2 mg via INTRAVENOUS

## 2020-04-20 MED ORDER — HYDROMORPHONE HCL 1 MG/ML IJ SOLN
INTRAMUSCULAR | Status: AC
  Filled 2020-04-20: qty 1

## 2020-04-20 MED ORDER — VANCOMYCIN HCL 1000 MG IV SOLR
INTRAVENOUS | Status: DC | PRN
  Administered 2020-04-20: 1000 mg via INTRAVENOUS

## 2020-04-20 MED ORDER — VANCOMYCIN HCL 1000 MG IV SOLR
INTRAVENOUS | Status: AC
  Filled 2020-04-20: qty 1000

## 2020-04-20 MED ORDER — 0.9 % SODIUM CHLORIDE (POUR BTL) OPTIME
TOPICAL | Status: DC | PRN
  Administered 2020-04-20 (×2): 1000 mL

## 2020-04-20 MED ORDER — LIDOCAINE-EPINEPHRINE 1 %-1:100000 IJ SOLN
INTRAMUSCULAR | Status: AC
  Filled 2020-04-20: qty 1

## 2020-04-20 MED ORDER — ROCURONIUM BROMIDE 10 MG/ML (PF) SYRINGE
PREFILLED_SYRINGE | INTRAVENOUS | Status: DC | PRN
  Administered 2020-04-20: 30 mg via INTRAVENOUS

## 2020-04-20 MED ORDER — THROMBIN 5000 UNITS EX SOLR
CUTANEOUS | Status: DC | PRN
  Administered 2020-04-20: 5000 [IU] via TOPICAL

## 2020-04-20 MED ORDER — PROPOFOL 10 MG/ML IV BOLUS
INTRAVENOUS | Status: AC
  Filled 2020-04-20: qty 20

## 2020-04-20 MED ORDER — THROMBIN (RECOMBINANT) 5000 UNITS EX SOLR
CUTANEOUS | Status: AC
  Filled 2020-04-20: qty 5000

## 2020-04-20 MED ORDER — SODIUM CHLORIDE 0.9 % IV SOLN
1.0000 g | INTRAVENOUS | Status: DC
  Filled 2020-04-20: qty 1

## 2020-04-20 MED ORDER — DEXAMETHASONE SODIUM PHOSPHATE 10 MG/ML IJ SOLN
INTRAMUSCULAR | Status: AC
  Filled 2020-04-20: qty 2

## 2020-04-20 MED ORDER — ORAL CARE MOUTH RINSE: 15.0000 mL | Freq: Once | OROMUCOSAL | Status: AC

## 2020-04-20 MED ORDER — ONDANSETRON HCL 4 MG/2ML IJ SOLN
INTRAMUSCULAR | Status: DC | PRN
  Administered 2020-04-20: 4 mg via INTRAVENOUS

## 2020-04-20 MED ORDER — LIDOCAINE-EPINEPHRINE 1 %-1:100000 IJ SOLN
INTRAMUSCULAR | Status: DC | PRN
  Administered 2020-04-20: 20 mL

## 2020-04-20 MED ORDER — MIDAZOLAM HCL 2 MG/2ML IJ SOLN
INTRAMUSCULAR | Status: AC
  Filled 2020-04-20: qty 2

## 2020-04-20 MED ORDER — DROPERIDOL 2.5 MG/ML IJ SOLN: 0.6250 mg | Freq: Once | INTRAMUSCULAR | Status: DC | PRN

## 2020-04-20 MED ORDER — PIPERACILLIN-TAZOBACTAM 3.375 G IVPB
3.3750 g | Freq: Three times a day (TID) | INTRAVENOUS | Status: DC
  Administered 2020-04-20 – 2020-04-22 (×6): 3.375 g via INTRAVENOUS
  Filled 2020-04-20 (×6): qty 50

## 2020-04-20 MED ORDER — LIDOCAINE 2% (20 MG/ML) 5 ML SYRINGE
INTRAMUSCULAR | Status: DC | PRN
  Administered 2020-04-20: 100 mg via INTRAVENOUS

## 2020-04-20 MED ORDER — DEXAMETHASONE SODIUM PHOSPHATE 10 MG/ML IJ SOLN
INTRAMUSCULAR | Status: DC | PRN
  Administered 2020-04-20: 10 mg via INTRAVENOUS

## 2020-04-20 MED ORDER — THROMBIN 5000 UNITS EX SOLR: OROMUCOSAL | Status: DC | PRN

## 2020-04-20 MED ORDER — FENTANYL CITRATE (PF) 250 MCG/5ML IJ SOLN
INTRAMUSCULAR | Status: AC
  Filled 2020-04-20: qty 5

## 2020-04-20 MED ORDER — CHLORHEXIDINE GLUCONATE 0.12 % MT SOLN: 15.0000 mL | Freq: Once | OROMUCOSAL | Status: AC

## 2020-04-20 MED ORDER — SODIUM CHLORIDE 0.9 % IV SOLN
1.0000 g | INTRAVENOUS | Status: AC
  Administered 2020-04-20: 1 g via INTRAVENOUS
  Filled 2020-04-20: qty 1

## 2020-04-20 MED ORDER — FENTANYL CITRATE (PF) 250 MCG/5ML IJ SOLN
INTRAMUSCULAR | Status: DC | PRN
  Administered 2020-04-20: 50 ug via INTRAVENOUS
  Administered 2020-04-20: 100 ug via INTRAVENOUS
  Administered 2020-04-20: 25 ug via INTRAVENOUS
  Administered 2020-04-20: 50 ug via INTRAVENOUS
  Administered 2020-04-20: 25 ug via INTRAVENOUS

## 2020-04-20 MED ORDER — VANCOMYCIN HCL 1500 MG/300ML IV SOLN
1500.0000 mg | Freq: Three times a day (TID) | INTRAVENOUS | Status: DC
  Administered 2020-04-21 – 2020-04-22 (×5): 1500 mg via INTRAVENOUS
  Filled 2020-04-20 (×6): qty 300

## 2020-04-20 MED ORDER — THROMBIN 5000 UNITS EX SOLR
CUTANEOUS | Status: AC
  Filled 2020-04-20: qty 5000

## 2020-04-20 MED ORDER — PROPOFOL 10 MG/ML IV BOLUS
INTRAVENOUS | Status: DC | PRN
  Administered 2020-04-20: 200 mg via INTRAVENOUS

## 2020-04-20 MED ORDER — BACITRACIN ZINC 500 UNIT/GM EX OINT
TOPICAL_OINTMENT | CUTANEOUS | Status: AC
  Filled 2020-04-20: qty 28.35

## 2020-04-20 MED ORDER — SUGAMMADEX SODIUM 200 MG/2ML IV SOLN
INTRAVENOUS | Status: DC | PRN
  Administered 2020-04-20: 200 mg via INTRAVENOUS

## 2020-04-20 SURGICAL SUPPLY — 60 items
BLADE CLIPPER SURG (BLADE) ×2 IMPLANT
BNDG GAUZE ELAST 4 BULKY (GAUZE/BANDAGES/DRESSINGS) IMPLANT
BUR ACORN 9.0 PRECISION (BURR) ×2 IMPLANT
BUR MATCHSTICK NEURO 3.0 LAGG (BURR) IMPLANT
BUR SPIRAL ROUTER 2.3 (BUR) IMPLANT
CANISTER SUCT 3000ML PPV (MISCELLANEOUS) ×2 IMPLANT
CLIP VESOCCLUDE MED 6/CT (CLIP) IMPLANT
COVER WAND RF STERILE (DRAPES) ×2 IMPLANT
DRAPE NEUROLOGICAL W/INCISE (DRAPES) ×2 IMPLANT
DRAPE SHEET LG 3/4 BI-LAMINATE (DRAPES) ×2 IMPLANT
DRAPE SURG 17X23 STRL (DRAPES) IMPLANT
DRAPE WARM FLUID 44X44 (DRAPES) ×2 IMPLANT
DURAPREP 6ML APPLICATOR 50/CS (WOUND CARE) ×2 IMPLANT
ELECT REM PT RETURN 9FT ADLT (ELECTROSURGICAL) ×2
ELECTRODE REM PT RTRN 9FT ADLT (ELECTROSURGICAL) ×1 IMPLANT
EVACUATOR 1/8 PVC DRAIN (DRAIN) IMPLANT
EVACUATOR SILICONE 100CC (DRAIN) IMPLANT
GAUZE 4X4 16PLY RFD (DISPOSABLE) IMPLANT
GAUZE SPONGE 4X4 12PLY STRL (GAUZE/BANDAGES/DRESSINGS) ×2 IMPLANT
GLOVE BIOGEL PI IND STRL 7.5 (GLOVE) ×2 IMPLANT
GLOVE BIOGEL PI INDICATOR 7.5 (GLOVE) ×2
GLOVE ECLIPSE 7.5 STRL STRAW (GLOVE) ×4 IMPLANT
GLOVE EXAM NITRILE LRG STRL (GLOVE) IMPLANT
GLOVE EXAM NITRILE XL STR (GLOVE) IMPLANT
GLOVE EXAM NITRILE XS STR PU (GLOVE) IMPLANT
GOWN STRL REUS W/ TWL LRG LVL3 (GOWN DISPOSABLE) ×2 IMPLANT
GOWN STRL REUS W/ TWL XL LVL3 (GOWN DISPOSABLE) IMPLANT
GOWN STRL REUS W/TWL 2XL LVL3 (GOWN DISPOSABLE) IMPLANT
GOWN STRL REUS W/TWL LRG LVL3 (GOWN DISPOSABLE) ×2
GOWN STRL REUS W/TWL XL LVL3 (GOWN DISPOSABLE)
HEMOSTAT POWDER KIT SURGIFOAM (HEMOSTASIS) ×2 IMPLANT
HEMOSTAT SURGICEL 2X14 (HEMOSTASIS) IMPLANT
HOOK DURA 1/2IN (MISCELLANEOUS) ×2 IMPLANT
KIT BASIN OR (CUSTOM PROCEDURE TRAY) ×2 IMPLANT
KIT TURNOVER KIT B (KITS) ×2 IMPLANT
NEEDLE HYPO 22GX1.5 SAFETY (NEEDLE) ×2 IMPLANT
NS IRRIG 1000ML POUR BTL (IV SOLUTION) ×2 IMPLANT
PACK CRANIOTOMY CUSTOM (CUSTOM PROCEDURE TRAY) ×2 IMPLANT
PATTIES SURGICAL .5 X.5 (GAUZE/BANDAGES/DRESSINGS) IMPLANT
PATTIES SURGICAL .5 X3 (DISPOSABLE) IMPLANT
PATTIES SURGICAL 1X1 (DISPOSABLE) IMPLANT
SPONGE NEURO XRAY DETECT 1X3 (DISPOSABLE) IMPLANT
SPONGE SURGIFOAM ABS GEL SZ50 (HEMOSTASIS) ×2 IMPLANT
STAPLER VISISTAT 35W (STAPLE) ×2 IMPLANT
STOCKINETTE 6  STRL (DRAPES)
STOCKINETTE 6 STRL (DRAPES) IMPLANT
SUT ETHILON 3 0 FSL (SUTURE) IMPLANT
SUT ETHILON 3 0 PS 1 (SUTURE) IMPLANT
SUT MNCRL AB 3-0 PS2 18 (SUTURE) ×2 IMPLANT
SUT NURALON 4 0 TR CR/8 (SUTURE) ×2 IMPLANT
SUT STEEL 0 (SUTURE)
SUT STEEL 0 18XMFL TIE 17 (SUTURE) IMPLANT
SUT VIC AB 0 CT1 18XCR BRD8 (SUTURE) ×1 IMPLANT
SUT VIC AB 0 CT1 8-18 (SUTURE) ×1
TOWEL GREEN STERILE (TOWEL DISPOSABLE) ×2 IMPLANT
TOWEL GREEN STERILE FF (TOWEL DISPOSABLE) ×2 IMPLANT
TRAY FOLEY MTR SLVR 16FR STAT (SET/KITS/TRAYS/PACK) ×2 IMPLANT
TUBE CONNECTING 12X1/4 (SUCTIONS) ×2 IMPLANT
UNDERPAD 30X36 HEAVY ABSORB (UNDERPADS AND DIAPERS) ×2 IMPLANT
WATER STERILE IRR 1000ML POUR (IV SOLUTION) ×2 IMPLANT

## 2020-04-20 NOTE — Progress Notes (Signed)
Neurosurgery Service Progress Note  Subjective: No acute events overnight, no new complaints today   Objective: Vitals:   04/19/20 2015 04/20/20 0000 04/20/20 0400 04/20/20 0742  BP: 120/60 124/68 116/67 (!) 123/59  Pulse: 84 82 67 80  Resp: 18 16 16 13   Temp: 98.5 F (36.9 C) 98.6 F (37 C) 98 F (36.7 C) 98.6 F (37 C)  TempSrc: Oral Oral Oral Oral  SpO2: 98% 99% 100% 100%  Weight:      Height:        Physical Exam: AOx3, PERRL, EOMI, FS, TM, Strength 5/5 x4, SILTx4, no drift Incision w/ purulent drainage  Assessment & Plan: 25 y.o. man s/p craniotomy for tumor resection , now w/ post-op wound infection.   -OR today for wound washout, return to 4[facility] post-op  Gordon [name]  04/20/20 12:00 PM

## 2020-04-20 NOTE — Transfer of Care (Signed)
Immediate Anesthesia Transfer of Care Note  Patient: Gordon [name]  Procedure(s) Performed: Irrigation and Debridement of Cranial Wound (Right )  Patient Location: PACU  Anesthesia Type:General  Level of Consciousness: drowsy  Airway & Oxygen Therapy: Patient Spontanous Breathing and Patient connected to nasal cannula oxygen  Post-op Assessment: Report given to RN and Post -op Vital signs reviewed and stable  Post vital signs: Reviewed and stable  Last Vitals:  Vitals Value Taken Time  BP 157/85 04/20/20 1325  Temp    Pulse 90 04/20/20 1329  Resp 19 04/20/20 1329  SpO2 100 % 04/20/20 1329  Vitals shown include unvalidated device data.  Last Pain:  Vitals:   04/20/20 1139  TempSrc:   PainSc: 0-No pain      Patients Stated Pain Goal: 3 (24/58/09 9833)  Complications: No complications documented.

## 2020-04-20 NOTE — Brief Op Note (Signed)
04/20/2020  1:21 PM  PATIENT:  Calixto Pavel Berquist  25 y.o. male  PRE-OPERATIVE DIAGNOSIS:  Post-op Wound Infection  POST-OPERATIVE DIAGNOSIS:  Post-op Wound Infection  PROCEDURE:  Procedure(s): Irrigation and Debridement of Cranial Wound (Right)  SURGEON:  Surgeon(s) and Role:    * [name], MD - Primary  PHYSICIAN ASSISTANT:   ASSISTANTS: none   ANESTHESIA:   general  EBL:  20 mL   BLOOD ADMINISTERED:none  DRAINS: none   LOCAL MEDICATIONS USED:  None  SPECIMEN:  Source of Specimen:  Scalp wound culture x2  DISPOSITION OF SPECIMEN:  Microbiology  COUNTS:  YES  TOURNIQUET:  * No tourniquets in log *  DICTATION: .Note written in EPIC  PLAN OF CARE: Admit to inpatient   PATIENT DISPOSITION:  PACU - hemodynamically stable.   Delay start of Pharmacological VTE agent (>24hrs) due to surgical blood loss or risk of bleeding: yes

## 2020-04-20 NOTE — Anesthesia Postprocedure Evaluation (Signed)
Anesthesia Post Note  Patient: Gordon [name]  Procedure(s) Performed: Irrigation and Debridement of Cranial Wound (Right )     Patient location during evaluation: PACU Anesthesia Type: General Level of consciousness: sedated and patient cooperative Pain management: pain level controlled Vital Signs Assessment: post-procedure vital signs reviewed and stable Respiratory status: spontaneous breathing Cardiovascular status: stable Anesthetic complications: no   No complications documented.  Last Vitals:  Vitals:   04/20/20 1350 04/20/20 1400  BP: 139/84 137/83  Pulse: 79 80  Resp: 19 16  Temp:  37.1 C  SpO2: 100% 99%    Last Pain:  Vitals:   04/20/20 1400  TempSrc:   PainSc: 2                  Nolon Nations

## 2020-04-20 NOTE — Op Note (Signed)
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 I therefore recommended operative wound revision. This was discussed with the patient as well as risks, benefits, and alternatives and wished to proceed with surgery.   OPERATIVE DETAIL: The patient was taken to the operating room and placed on the OR table in the supine position. A formal time out was performed with two patient identifiers and confirmed the operative site. Anesthesia was induced by the anesthesia team. The operative site was marked, hair was clipped with surgical clippers, the area was then prepped and draped in a sterile fashion. The prior left frontal linear incision was opened with purulence immediately encountered. This was sent for culture x2. The wound was copiously irrigated. There was no significant deeper infection, no purulence around the bone edges, the bone was curetted with solid edges, no evidence of osteomyelitis. Devitalized material and granulation tissue was removed from the wound edges and a #15 blade was used to freshen up the wound edges bilaterally. The incision was undermined to allow for tension free closure.   All instrument and sponge counts were correct, the incision was then closed in layers. The patient was then returned to anesthesia for emergence. No apparent complications at the completion of the procedure.   EBL:  38mL   DRAINS: none   SPECIMENS: Wound cultures x2   [name], MD 04/20/20 12:03 PM

## 2020-04-20 NOTE — Anesthesia Procedure Notes (Signed)
Procedure Name: Intubation Date/Time: 04/20/2020 12:21 PM Performed by: Imagene Riches, CRNA Pre-anesthesia Checklist: Patient identified, Emergency Drugs available, Suction available and Patient being monitored Patient Re-evaluated:Patient Re-evaluated prior to induction Oxygen Delivery Method: Circle System Utilized Preoxygenation: Pre-oxygenation with 100% oxygen Induction Type: IV induction Ventilation: Mask ventilation without difficulty Laryngoscope Size: Miller and 2 Grade View: Grade I Tube type: Oral Tube size: 7.5 mm Number of attempts: 1 Airway Equipment and Method: Stylet and Oral airway Placement Confirmation: ETT inserted through vocal cords under direct vision,  positive ETCO2 and breath sounds checked- equal and bilateral Secured at: 22 cm Tube secured with: Tape Dental Injury: Teeth and Oropharynx as per pre-operative assessment

## 2020-04-20 NOTE — Progress Notes (Signed)
Neurosurgery Service Post-operative progress note  Assessment & Plan: 25 y.o. man s/p revision of infected scalp wound, recovering well.  -transfer back to 4NP after recovery in PACU -start vanc / zosyn -Cx pending, will narrow when speciated -activity as tolerated -advance diet as tolerated  [name]  04/20/20 1:24 PM

## 2020-04-20 NOTE — Anesthesia Preprocedure Evaluation (Addendum)
Anesthesia Evaluation  Patient identified by MRN, date of birth, ID band Patient awake    Reviewed: Allergy & Precautions, NPO status , Patient's Chart, lab work & pertinent test results  History of Anesthesia Complications (+) PONV and history of anesthetic complications  Airway Mallampati: II  TM Distance: >3 FB Neck ROM: Full    Dental  (+) Teeth Intact, Dental Advisory Given, Implants,    Pulmonary neg pulmonary ROS,    Pulmonary exam normal breath sounds clear to auscultation       Cardiovascular negative cardio ROS Normal cardiovascular exam Rhythm:Regular Rate:Normal     Neuro/Psych Seizures -,  Left frontal brain mass    GI/Hepatic negative GI ROS, Neg liver ROS,   Endo/Other  negative endocrine ROS  Renal/GU negative Renal ROS     Musculoskeletal negative musculoskeletal ROS (+)   Abdominal   Peds  Hematology negative hematology ROS (+)   Anesthesia Other Findings Day of surgery medications reviewed with the patient.  Reproductive/Obstetrics                            Anesthesia Physical  Anesthesia Plan  ASA: III  Anesthesia Plan: General   Post-op Pain Management:    Induction: Intravenous  PONV Risk Score and Plan: 3 and Midazolam, Dexamethasone, Ondansetron, Diphenhydramine and Treatment may vary due to age or medical condition  Airway Management Planned: Oral ETT  Additional Equipment: None  Intra-op Plan:   Post-operative Plan: Extubation in OR  Informed Consent: I have reviewed the patients History and Physical, chart, labs and discussed the procedure including the risks, benefits and alternatives for the proposed anesthesia with the patient or authorized representative who has indicated his/her understanding and acceptance.     Dental advisory given  Plan Discussed with: CRNA  Anesthesia Plan Comments:       Anesthesia Quick Evaluation

## 2020-04-20 NOTE — Progress Notes (Signed)
Pharmacy Antibiotic Note  Gordon [name] is a 25 y.o. male admitted on 04/19/2020 with wound infection. Patient is s/p I&D of cranial wound. Pharmacy has been consulted for Zosyn and vancomycin dosing.  WBC wnl, AF, SCr wnl   Plan: -Zosyn 3.375 gm IV Q 8 hours (EI infusion) -Vancomycin 2 gm IV load followed by vancomycin 1500 mg IV Q 8 hours. Goal AUC 400-550. Expected AUC: 507 SCr used: 0.88   Height: 6\' 3"  (190.5 cm) Weight: 92.5 kg (204 lb) IBW/kg (Calculated) : 84.5  Temp (24hrs), Avg:98.4 F (36.9 C), Min:98 F (36.7 C), Max:98.7 F (37.1 C)  Recent Labs  Lab 04/19/20 1023 04/19/20 1204  WBC 8.4  --   CREATININE 0.88  --   LATICACIDVEN 0.8 0.7    Estimated Creatinine Clearance: 156 mL/min (by C-G formula based on SCr of 0.88 mg/dL).    No Known Allergies  Antimicrobials this admission: Vanc 3/29 >>  Zosyn 3/29 >>   Dose adjustments this admission:  Microbiology results: 3/29 Wound >>    Thank you for allowing pharmacy to be a part of this patient's care.  Albertina Parr, PharmD., BCPS, BCCCP Clinical Pharmacist Please refer to [facility] for unit-specific pharmacist

## 2020-04-21 ENCOUNTER — Encounter (HOSPITAL_COMMUNITY): Payer: Self-pay | Admitting: Neurological Surgery

## 2020-04-21 NOTE — Plan of Care (Signed)
  Problem: Education: Goal: Knowledge of General Education information will improve Description: Including pain rating scale, medication(s)/side effects and non-pharmacologic comfort measures Outcome: Progressing   Problem: Health Behavior/Discharge Planning: Goal: Ability to manage health-related needs will improve Outcome: Progressing   Problem: Clinical Measurements: Goal: Ability to maintain clinical measurements within normal limits will improve Outcome: Progressing Goal: Cardiovascular complication will be avoided Outcome: Progressing   Problem: Activity: Goal: Risk for activity intolerance will decrease Outcome: Progressing   Problem: Nutrition: Goal: Adequate nutrition will be maintained Outcome: Progressing   Problem: Pain Managment: Goal: General experience of comfort will improve Outcome: Progressing   Problem: Safety: Goal: Ability to remain free from injury will improve Outcome: Progressing   Problem: Skin Integrity: Goal: Risk for impaired skin integrity will decrease Outcome: Progressing

## 2020-04-21 NOTE — Plan of Care (Signed)
  Problem: Education: Goal: Knowledge of General Education information will improve Description Including pain rating scale, medication(s)/side effects and non-pharmacologic comfort measures Outcome: Progressing   Problem: Health Behavior/Discharge Planning: Goal: Ability to manage health-related needs will improve Outcome: Progressing   

## 2020-04-21 NOTE — Progress Notes (Signed)
Neurosurgery Service Progress Note  Subjective: No acute events overnight, no new complaints   Objective: Vitals:   04/20/20 2020 04/20/20 2333 04/21/20 0305 04/21/20 0739  BP: 131/67 133/75 133/74 122/63  Pulse:    78  Resp: 20 20 20 16   Temp: 97.9 F (36.6 C) 97.8 F (36.6 C) 97.8 F (36.6 C) 97.8 F (36.6 C)  TempSrc: Oral Oral Oral Oral  SpO2:    99%  Weight:      Height:        Physical Exam: AOx3, PERRL, EOMI, FS, TM, Strength 5/5 x4, SILTx4, no drift Incision c/d/i  Assessment & Plan: 25 y.o. 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  -Cx still pending, NGTD -cont vanc/zosyn in the meantime -activity as tolerated, diet as tolerated -final path from index surgery still pending  [name]  04/21/20 8:52 AM

## 2020-04-22 ENCOUNTER — Encounter (HOSPITAL_COMMUNITY): Payer: Self-pay

## 2020-04-22 LAB — SURGICAL PATHOLOGY

## 2020-04-22 MED ORDER — CEPHALEXIN 500 MG PO CAPS: 500.0000 mg | ORAL_CAPSULE | Freq: Three times a day (TID) | ORAL | 0 refills | Status: DC

## 2020-04-22 MED ORDER — CEFDINIR 300 MG PO CAPS: 300.0000 mg | ORAL_CAPSULE | Freq: Two times a day (BID) | ORAL | 0 refills | Status: DC

## 2020-04-22 MED ORDER — CEFDINIR 300 MG PO CAPS
300.0000 mg | ORAL_CAPSULE | Freq: Two times a day (BID) | ORAL | Status: DC
  Filled 2020-04-22: qty 1

## 2020-04-22 NOTE — Progress Notes (Addendum)
Neurosurgery Service Progress Note  Subjective: No acute events overnight, no new complaints   Objective: Vitals:   04/21/20 1942 04/21/20 2323 04/22/20 0320 04/22/20 0808  BP:  (!) 109/45 120/67 126/76  Pulse: 78  95 69  Resp: 20 18 20 14   Temp: 98.2 F (36.8 C) 98.9 F (37.2 C) 97.8 F (36.6 C) 98.1 F (36.7 C)  TempSrc: Oral Oral Oral Oral  SpO2: 97%  96% 96%  Weight:      Height:        Physical Exam: AOx3, PERRL, EOMI, FS, TM, Strength 5/5 x4, SILTx4, no drift Incision c/d/i  Assessment & Plan: 25 y.o. 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  -Cx w/ S aureus, sensitivities pending. If MSSA then will d/c home today, if MRSA then will need IV ABx and will d/c home tomorrow p PICC Placement -cont vanc/zosyn in the meantime -activity as tolerated, diet as tolerated -final path from index surgery still pending  [name]  04/22/20 9:49 AM  -sensitivities back, MSSA, will d/c to complete a 10d course of cephalexin

## 2020-04-22 NOTE — Progress Notes (Signed)
Patient given discharge instructions and stated understanding.  Patients questions were answered fully about medication.

## 2020-04-22 NOTE — Plan of Care (Signed)
  Problem: Education: Goal: Knowledge of General Education information will improve Description: Including pain rating scale, medication(s)/side effects and non-pharmacologic comfort measures 04/22/2020 0840 by Emmaline Life, RN Outcome: Progressing 04/22/2020 0839 by Emmaline Life, RN Outcome: Progressing   Problem: Health Behavior/Discharge Planning: Goal: Ability to manage health-related needs will improve 04/22/2020 0840 by Emmaline Life, RN Outcome: Progressing 04/22/2020 0839 by Emmaline Life, RN Outcome: Progressing   Problem: Clinical Measurements: Goal: Ability to maintain clinical measurements within normal limits will improve 04/22/2020 0840 by Emmaline Life, RN Outcome: Progressing 04/22/2020 0839 by Emmaline Life, RN Outcome: Progressing Goal: Will remain free from infection 04/22/2020 0840 by Emmaline Life, RN Outcome: Progressing 04/22/2020 0839 by Emmaline Life, RN Outcome: Progressing

## 2020-04-22 NOTE — Discharge Instructions (Signed)
Discharge Instructions  No restriction in activities, slowly increase your activity back to normal.   Your incision is closed with sutures. These will be removed at your follow up visit with [name]. If they become bothersome or cause discomfort, apply some antibiotic ointment like bacitracin or neosporin on the sutures. This will soften them up and usually makes them more comfortable.  Okay to shower on the day of discharge. Be gentle when cleaning your incision. Use regular soap and water. If that is uncomfortable, try using baby shampoo. Do not submerge the wound under water for 2 weeks after surgery.  Follow up with [name] in 2 weeks after discharge. If you do not already have a discharge appointment, please call his office at [phone] to schedule a follow up appointment. If you have any concerns or questions, please call the office and let us know.

## 2020-04-22 NOTE — Plan of Care (Signed)

## 2020-04-22 NOTE — Plan of Care (Signed)

## 2020-04-22 NOTE — Discharge Summary (Addendum)
Discharge Summary  Date of Admission: 04/19/2020  Date of Discharge: 04/22/20  Attending Physician: Emelda Brothers, MD  Hospital Course: Patient was admitted due to purulent wound drainage after a recent craniotomy. He was taken to the OR for wound washout without issue. He was started on vanc and zosyn, cultures grew out pan-sensitive MSSA, he was therefore transitioned to cefdinir and discharged home on 04/22/20.   Neurologic exam at discharge:  AOx3, PERRL, EOMI, FS, TM Strength 5/5 x4, SILTx4  Discharge diagnosis: Post-operative wound infection  [name], MD 04/22/20 3:25 PM

## 2020-04-26 ENCOUNTER — Inpatient Hospital Stay: Payer: 59 | Attending: Internal Medicine

## 2020-04-26 DIAGNOSIS — R569 Unspecified convulsions: Secondary | ICD-10-CM | POA: Insufficient documentation

## 2020-04-26 DIAGNOSIS — C711 Malignant neoplasm of frontal lobe: Secondary | ICD-10-CM | POA: Insufficient documentation

## 2020-04-26 DIAGNOSIS — Z79899 Other long term (current) drug therapy: Secondary | ICD-10-CM | POA: Insufficient documentation

## 2020-04-26 LAB — AEROBIC/ANAEROBIC CULTURE W GRAM STAIN (SURGICAL/DEEP WOUND)

## 2020-04-28 NOTE — Progress Notes (Signed)
Location/Histology of Brain Tumor: Left Frontal Lobe   Patient presented with new onset seizure.  MRI Brain 04/04/2020: Postoperative changes from interval left frontal lobe tumor resection. Small amount of nonenhancing T2 hyperintensity adjacent to the inferior aspect of the cavity; attention on follow-up to assess for residual tumor  MRI Brain 04/02/2020: Left frontal mass with multiple areas of faint nodular contrast enhancement consistent with anaplastic astrocytoma.  CT Head 04/02/2020: Ill defined area of cortical and subcortical low density in the left frontal lobe with questionable mild surrounding edema and trace left-to-right midline shift. Favor neoplasm, with differential of infection or infarct. Recommend further evaluation with MRI, without and with IV contrast.  Past or anticipated interventions, if any, per neurosurgery: [name] -Left Craniotomy 04/04/2020 -Irrigation and debridement of Cranial Wound 04/20/2020  -Follow-up next week for staple removal.  Past or anticipated interventions, if any, per medical oncology:  Dr. Mickeal Skinner 04/29/2020 9 am  04/12/2020 -Gordon [name] presents with clinical and radiographic syndrome localizing to the left frontal lobe. -Etiology is likely primary CNS neoplasm, glioma.   -Quality of surgery was good, with results reviewed in brain tumor board this AM.  -He has no deficits from surgery aside from some fatigue, mental dullness. -We had an extensive conversation with him and his mother regarding suspected pathologies, prognoses, and available treatment pathways for brain tumor.  They understand this could include radiation or chemotherapy. -We also discussed epilepsy safety, driving restrictions.  He will continue Keppra 750mg  BID for now.   -May stay off of decadron if tolerated. -The patient is not a candidate for a research protocol at this time due to path pending.   Dose of Decadron, if applicable: not currently  Recent  neurologic symptoms, if any:   Seizures: only one seizure 04/02/2020   Headaches: denies  Nausea: denies  Dizziness/ataxia:denies  Difficulty with hand coordination: denies   Focal numbness/weakness: denies   Visual deficits/changes: denies  Confusion/Memory deficits: denies  Auditory changes: ringing in his ears ocassionally    SAFETY ISSUES:  Prior radiation? No  Pacemaker/ICD? No  Possible current pregnancy? n/a  Is the patient on methotrexate? No  Additional Complaints / other details:   Patient is accompanied by his mother Gordon [name] and his father Gordon [name].  His stepfather Gordon [name] was present for Dr Renda Rolls visit.  He presently is taking an incomplete at [employer] and will finish college at his leisure.  He is living at home currently.    He had wound post surgery and currently is finishing up his dosing of cefdinir.

## 2020-04-29 ENCOUNTER — Ambulatory Visit
Admission: RE | Admit: 2020-04-29 | Discharge: 2020-04-29 | Disposition: A | Discharge status: Home / Self Care | Payer: 59 | Source: Ambulatory Visit | Attending: Radiation Oncology | Admitting: Radiation Oncology

## 2020-04-29 ENCOUNTER — Other Ambulatory Visit: Payer: Self-pay

## 2020-04-29 ENCOUNTER — Inpatient Hospital Stay: Payer: 59 | Admitting: Internal Medicine

## 2020-04-29 VITALS — BP 133/76 | HR 80 | Temp 96.9°F | Resp 18 | Wt 200.7 lb

## 2020-04-29 DIAGNOSIS — C719 Malignant neoplasm of brain, unspecified: Secondary | ICD-10-CM

## 2020-04-29 DIAGNOSIS — R569 Unspecified convulsions: Secondary | ICD-10-CM

## 2020-04-29 DIAGNOSIS — C711 Malignant neoplasm of frontal lobe: Secondary | ICD-10-CM

## 2020-04-29 DIAGNOSIS — Z79899 Other long term (current) drug therapy: Secondary | ICD-10-CM | POA: Diagnosis not present

## 2020-04-29 NOTE — Progress Notes (Signed)
[facility] [place], [place] [address] [phone]   Interval Evaluation  Date of Service: 04/29/20 Patient Name: Gordon [name] Patient MRN: [id number] Patient DOB: [birth year] Provider: Ventura Sellers, MD  Identifying Statement:  Gordon [name] is a 25 y.o. 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; path is grade 4 astrocytoma, IDH-1 mutant   04/20/2020 Surgery   Washout of surgical site collection; speciates MSSA     Biomarkers:  MGMT Unknown.  IDH 1/2 Mutated.  EGFR Unknown  TERT Unknown   Interval History:  Gordon [name] presents to clinic for clinical follow up and path review today.  Continues on antibiotics after wound infection and washout last week.  He feels well, no issues with movement, gait or balance.  He has resumed some classwork as well, without real issue.  Staying active but not driving at this time.  Presents today with his mother and step-father.  H+P (04/12/20) Patient presented to medical attention three weeks prior with episode of loss of awareness and generalized shaking consistent with seizure episode.  CNS imaging demonstrated an enhancing left frontal mass, which was resected on 04/04/20 by [name].  He tolerated surgery well, has no significant complaints at this time.  Last day of decadron taper is today.  He has been more or less independent at home, starting to resume some of his studies at school.  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.  No other significant medical history.  Medications: Current Outpatient Medications on File Prior to Visit  Medication Sig Dispense Refill  . cefdinir (OMNICEF) 300 MG capsule Take 1 capsule (300 mg total) by mouth every 12 (twelve) hours. 20 capsule 0  . levETIRAcetam (KEPPRA) 750 MG  tablet Take 1 tablet (750 mg total) by mouth 2 (two) times daily. 60 tablet 5  . Multiple Vitamin (MULTIVITAMIN PO) Take 1 capsule by mouth daily.    . Omega-3 Fatty Acids (OMEGA 3 PO) Take 1 capsule by mouth daily.    . Probiotic Product (PROBIOTIC PO) Take 1 capsule by mouth daily.    Marland Kitchen HYDROcodone-acetaminophen (NORCO/VICODIN) 5-325 MG tablet Take 1 tablet by mouth every 4 (four) hours as needed for moderate pain. (Patient not taking: Reported on 04/29/2020) 30 tablet 0  . ibuprofen (ADVIL) 200 MG tablet Take 200-400 mg by mouth as needed (pain). (Patient not taking: Reported on 04/29/2020)     No current facility-administered medications on file prior to visit.    Allergies: No Known Allergies Past Medical History:  Past Medical History:  Diagnosis Date  . Bulging lumbar disc   . Complication of anesthesia   . PONV (postoperative nausea and vomiting)    Vomited x1 after craniotomy surgery  . Seizure ([place]) 04/03/2020   x 1 at home pre-admission.  Otherwise, no history.   Past Surgical History:  Past Surgical History:  Procedure Laterality Date  . BURR HOLE Right 04/20/2020   Procedure: Irrigation and Debridement of Cranial Wound;  Surgeon: [name], MD;  Location: [facility];  Service: Neurosurgery;  Laterality: Right;  . CRANIOTOMY Left 04/04/2020   Procedure: LEFT CRANIOTOMY FOR TUMOR EXCISION;  Surgeon: [name], MD;  Location: [facility];  Service: Neurosurgery;  Laterality: Left;  . TONSILLECTOMY    . WISDOM TOOTH EXTRACTION  Social History:  Social History   Socioeconomic History  . Marital status: Single    Spouse name: Not on file  . Number of children: 0  . Years of education: Not on file  . Highest education level: Not on file  Occupational History  . Not on file  Tobacco Use  . Smoking status: Never Smoker  . Smokeless tobacco: Never Used  Vaping Use  . Vaping Use: Never used  Substance and Sexual Activity  . Alcohol use: No    Alcohol/week: 0.0  standard drinks  . Drug use: No  . Sexual activity: Yes    Birth control/protection: Condom  Other Topics Concern  . Not on file  Social History Narrative  . Not on file   Social Determinants of Health   Financial Resource Strain: Not on file  Food Insecurity: Not on file  Transportation Needs: Not on file  Physical Activity: Not on file  Stress: Not on file  Social Connections: Not on file  Intimate Partner Violence: Not on file   Family History: No family history on file.  Review of Systems: Constitutional: Doesn't report fevers, chills or abnormal weight loss Eyes: Doesn't report blurriness of vision Ears, nose, mouth, throat, and face: Doesn't report sore throat Respiratory: Doesn't report cough, dyspnea or wheezes Cardiovascular: Doesn't report palpitation, chest discomfort  Gastrointestinal:  Doesn't report nausea, constipation, diarrhea GU: Doesn't report incontinence Skin: Doesn't report skin rashes Neurological: Per HPI Musculoskeletal: Doesn't report joint pain Behavioral/Psych: Doesn't report anxiety  Physical Exam: Vitals:   04/29/20 0917  BP: 133/76  Pulse: 80  Resp: 18  Temp: (!) 96.9 F (36.1 C)  SpO2: 100%   KPS: 90. General: Alert, cooperative, pleasant, in no acute distress Head: Normal EENT: No conjunctival injection or scleral icterus.  Lungs: Resp effort normal Cardiac: Regular rate Abdomen: Non-distended abdomen Skin: No rashes cyanosis or petechiae. Extremities: No clubbing or edema  Neurologic Exam: Mental Status: Awake, alert, attentive to examiner. Oriented to self and environment. Language is fluent with intact comprehension.  Cranial Nerves: Visual acuity is grossly normal. Visual fields are full. Extra-ocular movements intact. No ptosis. Face is symmetric Motor: Tone and bulk are normal. Power is full in both arms and legs. Reflexes are symmetric, no pathologic reflexes present.  Sensory: Intact to light touch Gait:  Normal.   Labs: I have reviewed the data as listed    Component Value Date/Time   NA 138 04/19/2020 1023   K 4.6 04/19/2020 1023   CL 101 04/19/2020 1023   CO2 28 04/19/2020 1023   GLUCOSE 85 04/19/2020 1023   BUN 12 04/19/2020 1023   CREATININE 0.88 04/19/2020 1023   CALCIUM 10.0 04/19/2020 1023   PROT 7.8 04/19/2020 1023   ALBUMIN 3.9 04/19/2020 1023   AST 18 04/19/2020 1023   ALT 22 04/19/2020 1023   ALKPHOS 44 04/19/2020 1023   BILITOT 0.7 04/19/2020 1023   GFRNONAA >60 04/19/2020 1023   GFRAA  08/10/2009 2150    NOT CALCULATED        The eGFR has been calculated using the MDRD equation. This calculation has not been validated in all clinical situations. eGFR's persistently <60 mL/min signify possible Chronic Kidney Disease.   Lab Results  Component Value Date   WBC 8.4 04/19/2020   NEUTROABS 5.7 04/19/2020   HGB 13.7 04/19/2020   HCT 41.3 04/19/2020   MCV 89.8 04/19/2020   PLT 393 04/19/2020    Imaging:  CT Head Wo Contrast  Result  Date: 04/02/2020 CLINICAL DATA:  Seizure, nontraumatic (Age 63-40y) EXAM: CT HEAD WITHOUT CONTRAST TECHNIQUE: Contiguous axial images were obtained from the base of the skull through the vertex without intravenous contrast. COMPARISON:  None. FINDINGS: Brain: Ill-defined area of cortical and subcortical low-density involving the left frontal lobe, for example series 2, image 22. The may be mild surrounding edema. Low-density extends to the frontal horn of the left lateral ventricle. No hemorrhage. Trace left right midline shift. Normal ventricular size without hydrocephalus. Basilar cisterns are patent. Vascular: No hyperdense vessel. Skull: No fracture or focal lesion. Sinuses/Orbits: Minimal opacification of left ethmoid air cell. Otherwise clear paranasal sinuses. Mastoid air cells are well aerated. Included orbits are unremarkable. Other: None. IMPRESSION: 1. Ill-defined area of cortical and subcortical low-density involving the left  frontal lobe with questionable mild surrounding edema and trace left-to-right midline shift. Favor neoplasm, with differential of infection or infarct. Recommend further evaluation with MRI, without and with IV contrast. 2. No hemorrhage or midline shift. These results were called by telephone at the time of interpretation on 04/02/2020 at 6:29 pm to provider [facility] , who verbally acknowledged these results. Electronically Signed   By: Keith Rake M.D.   On: 04/02/2020 18:29   MR BRAIN W WO CONTRAST  Result Date: 04/04/2020 CLINICAL DATA:  Postop day 0 from left frontal tumor resection. EXAM: MRI HEAD WITHOUT AND WITH CONTRAST TECHNIQUE: Multiplanar, multiecho pulse sequences of the brain and surrounding structures were obtained without and with intravenous contrast. CONTRAST:  83m GADAVIST GADOBUTROL 1 MMOL/ML IV SOLN COMPARISON:  04/02/2020 FINDINGS: Brain: Sequelae of interval left frontal craniotomy and tumor resection are identified. There is a resection cavity medially in the left frontal lobe containing blood products. Pneumocephalus is noted along with a small amount of extra-axial fluid subjacent to the craniotomy, and there is mild mass effect on the left frontal horn without midline shift or hydrocephalus. There is a small amount of T2 FLAIR hyperintensity adjacent to the inferior aspect of the resection cavity, most notable anteriorly (series 7, image 35) for which a very small amount of residual tumor is possible. No suspicious enhancement is identified. Postoperative smooth dural enhancement is noted. There is no acute infarct. The brain is normal in signal elsewhere. Vascular: Major intracranial vascular flow voids are preserved. Skull and upper cervical spine: Left frontal craniotomy with overlying scalp fluid. Sinuses/Orbits: Unremarkable orbits. Minimal left ethmoid air cell mucosal thickening. Clear mastoid air cells. Other: None. IMPRESSION: Postoperative changes from interval  left frontal lobe tumor resection. Small amount of nonenhancing T2 hyperintensity adjacent to the inferior aspect of the cavity; attention on follow-up to assess for residual tumor. Electronically Signed   By: ALogan BoresM.D.   On: 04/04/2020 20:56   MR Brain W and Wo Contrast  Result Date: 04/02/2020 CLINICAL DATA:  Seizure EXAM: MRI HEAD WITHOUT AND WITH CONTRAST TECHNIQUE: Multiplanar, multiecho pulse sequences of the brain and surrounding structures were obtained without and with intravenous contrast. CONTRAST:  161mGADAVIST GADOBUTROL 1 MMOL/ML IV SOLN COMPARISON:  Head CT 04/02/2020 FINDINGS: Brain: There is a large area of abnormal signal intensity in the left frontal lobe. At the central, superior aspect of this region is a contrast-enhancing nodule that measures approximately 11 x 8 mm. There are other areas of faint contrast enhancement more inferiorly. No hemorrhage. Hyperintense T2-weighted signal fills most of the anterior left frontal white matter. Otherwise, white matter signal is normal. There is rightward bulging of the falx cerebri without subfalcine  herniation. No hydrocephalus. The hippocampi are normal and symmetric in size and signal. The hypothalamus and mamillary bodies are normal. Vascular: Major flow voids are preserved. Skull and upper cervical spine: Normal calvarium and skull base. Visualized upper cervical spine and soft tissues are normal. Sinuses/Orbits:No paranasal sinus fluid levels or advanced mucosal thickening. No mastoid or middle ear effusion. Normal orbits. IMPRESSION: 1. Left frontal mass with multiple areas of faint nodular contrast enhancement and large area of surrounding abnormal signal intensity, most consistent with anaplastic astrocytoma. Electronically Signed   By: Ulyses Jarred M.D.   On: 04/02/2020 23:34   EEG adult  Result Date: 04/03/2020 Lora Havens, MD     04/03/2020 10:37 AM Patient Name: Gordon [name] MRN: [id number] Epilepsy Attending:  Lora Havens Referring Physician/Provider: Dr Kerney Elbe Date: 04/03/2020 Duration: 30.22 mins Patient history: 25 year old male with first time seizure. EEG to evaluate for seizure Level of alertness: Awake AEDs during EEG study: LEV Technical aspects: This EEG study was done with scalp electrodes positioned according to the 10-20 International system of electrode placement. Electrical activity was acquired at a sampling rate of 500Hz  and reviewed with a high frequency filter of 70Hz  and a low frequency filter of 1Hz . EEG data were recorded continuously and digitally stored. Description: The posterior dominant rhythm consists of 9-10 Hz activity of moderate voltage (25-35 uV) seen predominantly in posterior head regions, symmetric and reactive to eye opening and eye closing. EEG showed intermittent let frontotemporal 3 to 6 Hz theta-delta slowing. Sharp waves were also seen in left frontotemporal region. Hyperventilation did not show any EEG change.  Physiologic photic driving was seen during photic stimulation.  ABNORMALITY - Sharp waves, left frontotemporal region -Intermittent slow, left frontotemporal region IMPRESSION: This study showed evidence of potential epileptogenicity and cortical dysfunction arising from left frontotemporal region likely secondary to underlying tumor. No seizureswere seen throughout the recording. Lora Havens    Pathology:  SURGICAL PATHOLOGY  CASE: [phone]  PATIENT: Gordon [name]  Surgical Pathology Report   Clinical History: Brain tumor (cm)   FINAL MICROSCOPIC DIAGNOSIS:   AB. BRAIN TUMOR, LEFT FRONTAL, EXCISION:  - Astrocytoma, IDH-mutant, CNS WHO grade 4.   COMMENT:   Histopathological classification: Astrocytoma with microvascular  proliferation  CNS WHO grade: 4  Molecular information (immunochemistry): IDH1 132H is positive; ATRX  shows loss of expression and p53 is positive in approximately 30% of  cells   This case was seen in  outside consultation at [facility] [phone]). The diagnosis and comment are reported here  verbatim. Please see outside pathology report for complete details.  Special stains and immunohistochemistry stains were performed and  interpreted at [facility].   INTRAOPERATIVE DIAGNOSIS:   A. Left frontal brain mass: "Lesional tissue present."  Intraoperative diagnosis rendered by Dr. Vic Ripper at 9:33 AM on  04/04/2020.   GROSS DESCRIPTION:   A: The specimen is received fresh for rapid intraoperative consultation  and consists of a 1.5 x 1.0 x 0.5 cm aggregate of tan-white soft tissue.  Representative sections are submitted for frozen section analysis. The  specimen is entirely submitted in 2 cassettes.  1 = frozen section remnant.  2 = remainder of tissue.   B: The specimen is received fresh and consists of a 6.9 x 6.0 x 2.7 cm  aggregate of tan-red softened tissue. Sectioning reveals tan-white  solid cut surfaces. Representative sections are submitted in 5  cassettes. Craig Staggers 04/05/2020)   Assessment/Plan Grade IV  glioma [facility]  Seizure ([place])  Anees Vanecek is clinically stable today.  We had an extensive conversation with him and his family regarding pathology, prognosis, and available treatment pathways for grade 4 astrocytoma, IDH-1 mutant.  We are encouraged by his good quality resection, young age, good functional status, and lack of focal deficits or breakthrough seizures.  We also discussed and patient consented for additional tumor profiling and sequencing through [facility].  Advanced tumor profiling could help identify actionable mutation for targeted therapy and lead to direct clinical benefit.     Because of grade 4 histology, we ultimately recommended proceeding with course of intensity modulated radiation therapy and concurrent daily Temozolomide. Radiation will be administered Mon-Fri over 6 weeks, Temodar will be dosed at 29m/m2 to be given daily  over 42 days.  We reviewed side effects of temodar, including fatigue, nausea/vomiting, constipation, and cytopenias.  Informed consent was verbally obtained at bedside to proceed with oral chemotherapy.  Chemotherapy should be held for the following:  ANC less than 1,000  Platelets less than 100,000  LFT or creatinine greater than 2x ULN  If clinical concerns/contraindications develop  Consultation with radiation oncology is set for later today; we would like to wait at least 2 weeks s/p wound debridement to begin IMRT.   Every 2 weeks during radiation, labs will be checked accompanied by a clinical evaluation in the brain tumor clinic.  He will continue Keppra 7539mBID for now.    Gordon Saharanford will return to clinic during week 2 of IMRT with labs for evaluation.  All questions were answered. The patient knows to call the clinic with any problems, questions or concerns. No barriers to learning were detected.  The total time spent in the encounter was 40 minutes and more than 50% was on counseling and review of test results   ZaVentura SellersMD Medical Director of Neuro-Oncology [facility] [place]4/07/22 9:25 AM

## 2020-04-29 NOTE — Progress Notes (Signed)
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   HISTORY OF PRESENT ILLNESS: Gordon [name] is a 25 y.o. male seen at the request of [name] and Dr. Mickeal Skinner for a newly diagnosed left frontal lobe grade IV astrocytoma. The patient originally presented after a seizure that was captured after walking dogs at home by a home video camera. It was reported that he was post ictal for about 10 minutes and was able to get in side afterwards. He was taken urgently to the ED on 04/02/20 the day this occurred and a CT head showed an ill defined area in the left frontal lobe with a trace left to right midline shift. An MRI measured an 11 x 8 mm lesion in the central left frontal lobe. There was rightward bulging of the falx cerebri without herniation. He was takn for left craniotomy with tumor excision on 04/04/20 by [name]. Final pathology recently resulted showing a grade IV astrocytoma. He did have a postoperative skin infection and underwent irrigation and debridement of cranial wound on 04/20/20. His post op imaging however on 04/04/20 showed postoperative changes in the left frontal lobe. He's met with Dr. Mickeal Skinner to review treatment options. He is seen today to discuss chemoRT.     PREVIOUS RADIATION THERAPY: No   PAST MEDICAL HISTORY:  Past Medical History:  Diagnosis Date  . Bulging lumbar disc   . Complication of anesthesia   . PONV (postoperative nausea and vomiting)    Vomited x1 after craniotomy surgery  . Seizure ([place]) 04/03/2020   x 1 at home pre-admission.  Otherwise, no history.       PAST SURGICAL  HISTORY: Past Surgical History:  Procedure Laterality Date  . BURR HOLE Right 04/20/2020   Procedure: Irrigation and Debridement of Cranial Wound;  Surgeon: [name], MD;  Location: [facility];  Service: Neurosurgery;  Laterality: Right;  . CRANIOTOMY Left 04/04/2020   Procedure: LEFT CRANIOTOMY FOR TUMOR EXCISION;  Surgeon: [name], MD;  Location: [facility];  Service: Neurosurgery;  Laterality: Left;  . TONSILLECTOMY    . WISDOM TOOTH EXTRACTION       FAMILY HISTORY: No family history on file.   SOCIAL HISTORY:  reports that he has never smoked. He has never used smokeless tobacco. He reports that he does not drink alcohol and does not use drugs. The patient is single. He lives at home with his mother. He was taking a break from classes at [employer] in business prior to his diagnosis but also worked part time at an [employer].   ALLERGIES: Patient has no known allergies.   MEDICATIONS:  Current Outpatient Medications  Medication Sig Dispense Refill  . cefdinir (OMNICEF) 300 MG capsule Take 1 capsule (300 mg total) by mouth every 12 (twelve) hours. 20 capsule 0  . HYDROcodone-acetaminophen (NORCO/VICODIN) 5-325 MG tablet Take 1 tablet by mouth every 4 (four) hours as needed for moderate pain. (Patient not taking: Reported on 04/29/2020) 30 tablet 0  . ibuprofen (ADVIL) 200 MG tablet Take 200-400 mg by mouth as  needed (pain). (Patient not taking: Reported on 04/29/2020)    . levETIRAcetam (KEPPRA) 750 MG tablet Take 1 tablet (750 mg total) by mouth 2 (two) times daily. 60 tablet 5  . Multiple Vitamin (MULTIVITAMIN PO) Take 1 capsule by mouth daily.    . Omega-3 Fatty Acids (OMEGA 3 PO) Take 1 capsule by mouth daily.    . Probiotic Product (PROBIOTIC PO) Take 1 capsule by mouth daily.     No current facility-administered medications for this encounter.     REVIEW OF SYSTEMS: On review of systems, the patient reports that he has felt foggy at times  mentally and very fatigued. Otherwise no additional seizures are reported, headaches, or pain at the surgical site. He reports no additional purulent drainage. No other complaints are verbalized.    PHYSICAL EXAM:  Wt Readings from Last 3 Encounters:  04/29/20 200 lb 11.2 oz (91 kg)  04/19/20 204 lb (92.5 kg)  04/12/20 204 lb 8 oz (92.8 kg)   Temp Readings from Last 3 Encounters:  04/29/20 (!) 96.9 F (36.1 C) (Tympanic)  04/22/20 98.2 F (36.8 C) (Oral)  04/12/20 97.9 F (36.6 C) (Tympanic)   BP Readings from Last 3 Encounters:  04/29/20 133/76  04/22/20 119/64  04/12/20 (!) 118/58   Pulse Readings from Last 3 Encounters:  04/29/20 80  04/22/20 71  04/12/20 63    In general this is a well appearing caucasian male in no acute distress. He's alert and oriented x4 and appropriate throughout the examination. Cardiopulmonary assessment is negative for acute distress and he exhibits normal effort. His scalp incision is to the left of midline. No erythema is otherwise noted.    ECOG = 1  0 - Asymptomatic (Fully active, able to carry on all predisease activities without restriction)  1 - Symptomatic but completely ambulatory (Restricted in physically strenuous activity but ambulatory and able to carry out work of a light or sedentary nature. For example, light housework, office work)  2 - Symptomatic, <50% in bed during the day (Ambulatory and capable of all self care but unable to carry out any work activities. Up and about more than 50% of waking hours)  3 - Symptomatic, >50% in bed, but not bedbound (Capable of only limited self-care, confined to bed or chair 50% or more of waking hours)  4 - Bedbound (Completely disabled. Cannot carry on any self-care. Totally confined to bed or chair)  5 - Death   Eustace Pen MM, Creech RH, Tormey DC, et al. [phone]). "Toxicity and response criteria of the [facility]". [place] Oncol. 5 (6): 649-55    LABORATORY  DATA:  Lab Results  Component Value Date   WBC 8.4 04/19/2020   HGB 13.7 04/19/2020   HCT 41.3 04/19/2020   MCV 89.8 04/19/2020   PLT 393 04/19/2020   Lab Results  Component Value Date   NA 138 04/19/2020   K 4.6 04/19/2020   CL 101 04/19/2020   CO2 28 04/19/2020   Lab Results  Component Value Date   ALT 22 04/19/2020   AST 18 04/19/2020   ALKPHOS 44 04/19/2020   BILITOT 0.7 04/19/2020      RADIOGRAPHY: CT Head Wo Contrast  Result Date: 04/02/2020 CLINICAL DATA:  Seizure, nontraumatic (Age 74-40y) EXAM: CT HEAD WITHOUT CONTRAST TECHNIQUE: Contiguous axial images were obtained from the base of the skull through the vertex without intravenous contrast. COMPARISON:  None. FINDINGS: Brain: Ill-defined area of cortical and subcortical low-density involving the  left frontal lobe, for example series 2, image 22. The may be mild surrounding edema. Low-density extends to the frontal horn of the left lateral ventricle. No hemorrhage. Trace left right midline shift. Normal ventricular size without hydrocephalus. Basilar cisterns are patent. Vascular: No hyperdense vessel. Skull: No fracture or focal lesion. Sinuses/Orbits: Minimal opacification of left ethmoid air cell. Otherwise clear paranasal sinuses. Mastoid air cells are well aerated. Included orbits are unremarkable. Other: None. IMPRESSION: 1. Ill-defined area of cortical and subcortical low-density involving the left frontal lobe with questionable mild surrounding edema and trace left-to-right midline shift. Favor neoplasm, with differential of infection or infarct. Recommend further evaluation with MRI, without and with IV contrast. 2. No hemorrhage or midline shift. These results were called by telephone at the time of interpretation on 04/02/2020 at 6:29 pm to provider [facility] , who verbally acknowledged these results. Electronically Signed   By: Keith Rake M.D.   On: 04/02/2020 18:29   MR BRAIN W WO CONTRAST  Result Date:  04/04/2020 CLINICAL DATA:  Postop day 0 from left frontal tumor resection. EXAM: MRI HEAD WITHOUT AND WITH CONTRAST TECHNIQUE: Multiplanar, multiecho pulse sequences of the brain and surrounding structures were obtained without and with intravenous contrast. CONTRAST:  49m GADAVIST GADOBUTROL 1 MMOL/ML IV SOLN COMPARISON:  04/02/2020 FINDINGS: Brain: Sequelae of interval left frontal craniotomy and tumor resection are identified. There is a resection cavity medially in the left frontal lobe containing blood products. Pneumocephalus is noted along with a small amount of extra-axial fluid subjacent to the craniotomy, and there is mild mass effect on the left frontal horn without midline shift or hydrocephalus. There is a small amount of T2 FLAIR hyperintensity adjacent to the inferior aspect of the resection cavity, most notable anteriorly (series 7, image 35) for which a very small amount of residual tumor is possible. No suspicious enhancement is identified. Postoperative smooth dural enhancement is noted. There is no acute infarct. The brain is normal in signal elsewhere. Vascular: Major intracranial vascular flow voids are preserved. Skull and upper cervical spine: Left frontal craniotomy with overlying scalp fluid. Sinuses/Orbits: Unremarkable orbits. Minimal left ethmoid air cell mucosal thickening. Clear mastoid air cells. Other: None. IMPRESSION: Postoperative changes from interval left frontal lobe tumor resection. Small amount of nonenhancing T2 hyperintensity adjacent to the inferior aspect of the cavity; attention on follow-up to assess for residual tumor. Electronically Signed   By: ALogan BoresM.D.   On: 04/04/2020 20:56   MR Brain W and Wo Contrast  Result Date: 04/02/2020 CLINICAL DATA:  Seizure EXAM: MRI HEAD WITHOUT AND WITH CONTRAST TECHNIQUE: Multiplanar, multiecho pulse sequences of the brain and surrounding structures were obtained without and with intravenous contrast. CONTRAST:  147m GADAVIST GADOBUTROL 1 MMOL/ML IV SOLN COMPARISON:  Head CT 04/02/2020 FINDINGS: Brain: There is a large area of abnormal signal intensity in the left frontal lobe. At the central, superior aspect of this region is a contrast-enhancing nodule that measures approximately 11 x 8 mm. There are other areas of faint contrast enhancement more inferiorly. No hemorrhage. Hyperintense T2-weighted signal fills most of the anterior left frontal white matter. Otherwise, white matter signal is normal. There is rightward bulging of the falx cerebri without subfalcine herniation. No hydrocephalus. The hippocampi are normal and symmetric in size and signal. The hypothalamus and mamillary bodies are normal. Vascular: Major flow voids are preserved. Skull and upper cervical spine: Normal calvarium and skull base. Visualized upper cervical spine and soft tissues are normal. Sinuses/Orbits:No  paranasal sinus fluid levels or advanced mucosal thickening. No mastoid or middle ear effusion. Normal orbits. IMPRESSION: 1. Left frontal mass with multiple areas of faint nodular contrast enhancement and large area of surrounding abnormal signal intensity, most consistent with anaplastic astrocytoma. Electronically Signed   By: Ulyses Jarred M.D.   On: 04/02/2020 23:34   EEG adult  Result Date: 04/03/2020 Lora Havens, MD     04/03/2020 10:37 AM Patient Name: Solmon Bohr MRN: [id number] Epilepsy Attending: Lora Havens Referring Physician/Provider: Dr Kerney Elbe Date: 04/03/2020 Duration: 30.22 mins Patient history: 25 year old male with first time seizure. EEG to evaluate for seizure Level of alertness: Awake AEDs during EEG study: LEV Technical aspects: This EEG study was done with scalp electrodes positioned according to the 10-20 International system of electrode placement. Electrical activity was acquired at a sampling rate of 500Hz  and reviewed with a high frequency filter of 70Hz  and a low frequency filter of 1Hz . EEG  data were recorded continuously and digitally stored. Description: The posterior dominant rhythm consists of 9-10 Hz activity of moderate voltage (25-35 uV) seen predominantly in posterior head regions, symmetric and reactive to eye opening and eye closing. EEG showed intermittent let frontotemporal 3 to 6 Hz theta-delta slowing. Sharp waves were also seen in left frontotemporal region. Hyperventilation did not show any EEG change.  Physiologic photic driving was seen during photic stimulation.  ABNORMALITY - Sharp waves, left frontotemporal region -Intermittent slow, left frontotemporal region IMPRESSION: This study showed evidence of potential epileptogenicity and cortical dysfunction arising from left frontotemporal region likely secondary to underlying tumor. No seizureswere seen throughout the recording. Priyanka Barbra Sarks       IMPRESSION/PLAN: 1. Grade IV Astrocytoma of the left frontal lobe. Dr. Lisbeth Renshaw discusses the pathology findings and reviews the nature of primary brain disease. Dr. Lisbeth Renshaw discusses the imaging results as well as reviews the discussion from brain oncology conference. He appears to be a good candidate for chemoradiation to treat the postoperative cavity in the left frontal lobe. Dr. Lisbeth Renshaw discusses the delivery and logistics of radiotherapy and anticipates a course of 6 weeks of radiotherapy. Written consent is obtained and placed in the chart, a copy was provided to the patient. He will return on 05/12/20 for simulation with IV contrast. We anticipate a 05/17/20 start date for his treatment. 2. Transportation concerns. The patient's mother requests assistance for transportation to and from appointments. We'll reach out to the transportation department to try to assist in coordinating treatment related transportation.  In a visit lasting 60 minutes, greater than 50% of the time was spent face to face discussing the patient's condition, in preparation for the discussion, and coordinating  the patient's care.   The above documentation reflects my direct findings during this shared patient visit. Please see the separate note by Dr. Lisbeth Renshaw on this date for the remainder of the patient's plan of care.    Carola Rhine, [facility]   **Disclaimer: This note was dictated with voice recognition software. Similar sounding words can inadvertently be transcribed and this note may contain transcription errors which may not have been corrected upon publication of note.**

## 2020-04-30 ENCOUNTER — Telehealth: Payer: Self-pay | Admitting: Pharmacist

## 2020-04-30 ENCOUNTER — Telehealth: Payer: Self-pay

## 2020-04-30 ENCOUNTER — Other Ambulatory Visit (HOSPITAL_COMMUNITY): Payer: Self-pay

## 2020-04-30 MED ORDER — TEMOZOLOMIDE 140 MG PO CAPS
140.0000 mg | ORAL_CAPSULE | Freq: Every day | ORAL | 0 refills | Status: DC
  Filled 2020-04-30: qty 45, 45d supply, fill #0
  Filled 2020-05-10: qty 28, 28d supply, fill #0

## 2020-04-30 MED ORDER — TEMOZOLOMIDE 20 MG PO CAPS
20.0000 mg | ORAL_CAPSULE | Freq: Every day | ORAL | 0 refills | Status: DC
  Filled 2020-04-30: qty 42, 42d supply, fill #0
  Filled 2020-05-10: qty 28, 28d supply, fill #0

## 2020-04-30 MED ORDER — ONDANSETRON HCL 8 MG PO TABS
8.0000 mg | ORAL_TABLET | Freq: Two times a day (BID) | ORAL | 1 refills | Status: DC | PRN
  Filled 2020-04-30 – 2020-05-10 (×2): qty 30, 15d supply, fill #0

## 2020-04-30 NOTE — Telephone Encounter (Signed)
Oral Oncology Patient Advocate Encounter  Prior Authorization for Temodar has been approved.    PA# [id number] Effective dates: 04/30/20 through 04/30/21  Patients co-pay is $10/each  Oral Oncology Clinic will continue to follow.   [place] Patient [place] Phone [phone] Fax [phone] 04/30/2020 10:51 AM

## 2020-04-30 NOTE — Telephone Encounter (Signed)
Oral Oncology Patient Advocate Encounter  Received notification from [facility] that prior authorization for Temodar is required.  PA submitted on CoverMyMeds Key [id number] Status is pending  Oral Oncology Clinic will continue to follow.  [place] Patient [place] Phone [phone] Fax [phone] 04/30/2020 10:43 AM

## 2020-04-30 NOTE — Telephone Encounter (Signed)
Oral Oncology Pharmacist Encounter  Received new prescription for Temodar (temozolomide) for the treatment of grade IV astrocytoma in conjunction with radiation, planned duration 42 days. Anticipated start day of radiation is 05/17/20.  Prescription dose and frequency assessed for appropriateness.   CMP and CBC w/ Diff from 04/19/20 assessed, labs stable for treatment initiation.  Current medication list in Epic reviewed, no relevant/significant DDIs with Temodar identified.  Evaluated chart and no patient barriers to medication adherence noted.   Patient agreement for treatment documented in MD note on 04/29/20.  Prescription has been e-scribed to the [facility] for benefits analysis and approval.  Oral Oncology Clinic will continue to follow for insurance authorization, copayment issues, initial counseling and start date.  Leron Croak, PharmD, BCPS Hematology/Oncology Clinical Pharmacist [facility] [phone] 04/30/2020 10:27 AM

## 2020-04-30 NOTE — Progress Notes (Signed)
START ON PATHWAY REGIMEN - Neuro     One cycle, concurrent with RT:     Temozolomide   **Always confirm dose/schedule in your pharmacy ordering system**  Patient Characteristics: Glioblastoma (Grade IV Glioma), Newly Diagnosed / Treatment Naive, Good Performance Status and/or Younger Patient, MGMT Promoter Unmethylated/Unknown Disease Classification: Glioma Disease Classification: Glioblastoma (Grade IV Glioma) Disease Status: Newly Diagnosed / Treatment Naive Performance Status: Good Performance Status and/or Younger Patient MGMT Promoter Methylation Status: Awaiting Test Results Intent of Therapy: Non-Curative / Palliative Intent, Discussed with Patient 

## 2020-05-04 ENCOUNTER — Other Ambulatory Visit (HOSPITAL_COMMUNITY): Payer: Self-pay

## 2020-05-10 ENCOUNTER — Other Ambulatory Visit (HOSPITAL_COMMUNITY): Payer: Self-pay

## 2020-05-10 NOTE — Progress Notes (Signed)
Has armband been applied?  Yes  Does patient have an allergy to IV contrast dye?: No   Has patient ever received premedication for IV contrast dye?: n/a  Does patient take metformin?: No  If patient does take metformin when was the last dose: n/a  Date of lab work: 04/19/2020 BUN: 12 CR: 0.88 eGfr: >60  IV site: Left AC  Has IV site been added to flowsheet? Yes     BP (!) 108/56 (BP Location: Left Arm, Patient Position: Sitting, Cuff Size: Large)   Pulse 71   Temp 97.6 F (36.4 C)   Resp 16   Ht _0  (1.905 m)   Wt 203 lb (92.1 kg)   SpO2 100%   BMI 25.37 kg/m

## 2020-05-10 NOTE — Telephone Encounter (Signed)
Oral Chemotherapy Pharmacist Encounter  I spoke with patient and his mother for overview of: Temodar (temozolomide) for the treatment of grade IV astrocytoma in conjunction with radiation, planned duration concomitant phase 42 days of therapy.  Patient will likely continue on Temodar for maintenance treatment for 6-12 cycles after completion of concomitant phase.  Counseled patient on administration, dosing, side effects, monitoring, drug-food interactions, safe handling, storage, and disposal.  Patient will take Temodar 140mg  capsules and Temodar 20mg  capsules, 160 mg total daily dose, by mouth once daily, may take at bedtime and on an empty stomach to decrease nausea and vomiting.  Patient will take Temodar concurrent with radiation for 42 days straight.  Temodar start date: 05/16/20 PM Radiation start date: 05/17/20   Patient will take Zofran 8mg  tablet, 1 tablet by mouth 30-60 min prior to Temodar dose to help decrease N/V once starting adjuvant therapy. Prophylactic Zofran will not be used at initiation of concurrent phase, but will be initiated if nausea develops despite Temodar administration on an empty stomach and at bedtime.   Adverse effects include but are not limited to: nausea, vomiting, anorexia, GI upset, rash, drug fever, and fatigue. Rare but serious adverse effects of pneumocystis pneumonia and secondary malignancy also discussed.  PCP prophylaxis will not be initiated at this time, but may be added based on lymphocyte count in the future.  Reviewed with patient importance of keeping a medication schedule and plan for any missed doses. No barriers to medication adherence identified.  Medication reconciliation performed and medication/allergy list updated.  Insurance authorization for Temodar has been obtained. Test claim at the pharmacy revealed copayment $20 for 1st fill of Temodar. This will ship from the [facility] on 05/11/20 to deliver to  patient's home on 05/12/20. Patient knows not to start medication until 05/16/20 PM.   Patient informed the pharmacy will reach out 5-7 days prior to needing next fill of Temodar to coordinate continued medication acquisition to prevent break in therapy.   All questions answered.  Mr. Gordon [name] voiced understanding and appreciation.   Medication education handout placed in mail for patient. Patient knows to call the office with questions or concerns. Oral Chemotherapy Clinic phone number provided to patient.   Leron Croak, PharmD, BCPS Hematology/Oncology Clinical Pharmacist [facility] [phone] 05/10/2020 3:09 PM

## 2020-05-11 ENCOUNTER — Other Ambulatory Visit (HOSPITAL_COMMUNITY): Payer: Self-pay

## 2020-05-12 ENCOUNTER — Ambulatory Visit
Admission: RE | Admit: 2020-05-12 | Discharge: 2020-05-12 | Disposition: A | Discharge status: Home / Self Care | Payer: 59 | Source: Ambulatory Visit | Attending: Radiation Oncology | Admitting: Radiation Oncology

## 2020-05-12 ENCOUNTER — Other Ambulatory Visit: Payer: Self-pay

## 2020-05-12 VITALS — BP 108/56 | HR 71 | Temp 97.6°F | Resp 16 | Ht 75.0 in | Wt 203.0 lb

## 2020-05-12 DIAGNOSIS — Z510 Encounter for antineoplastic radiation therapy: Secondary | ICD-10-CM | POA: Insufficient documentation

## 2020-05-12 DIAGNOSIS — C719 Malignant neoplasm of brain, unspecified: Secondary | ICD-10-CM

## 2020-05-12 DIAGNOSIS — C711 Malignant neoplasm of frontal lobe: Secondary | ICD-10-CM | POA: Insufficient documentation

## 2020-05-12 MED ORDER — SODIUM CHLORIDE 0.9% FLUSH
10.0000 mL | Freq: Once | INTRAVENOUS | Status: AC
  Administered 2020-05-12: 10 mL via INTRAVENOUS

## 2020-05-15 ENCOUNTER — Other Ambulatory Visit: Payer: Self-pay

## 2020-05-15 ENCOUNTER — Encounter (HOSPITAL_COMMUNITY): Payer: Self-pay | Admitting: Emergency Medicine

## 2020-05-15 ENCOUNTER — Inpatient Hospital Stay (HOSPITAL_COMMUNITY)
Admission: EM | Admit: 2020-05-15 | Discharge: 2020-05-18 | DRG: 857 | Disposition: A | Discharge status: Home / Self Care | Payer: 59 | Attending: Neurological Surgery | Admitting: Neurological Surgery

## 2020-05-15 ENCOUNTER — Observation Stay (HOSPITAL_COMMUNITY): Payer: 59

## 2020-05-15 DIAGNOSIS — Z85841 Personal history of malignant neoplasm of brain: Secondary | ICD-10-CM

## 2020-05-15 DIAGNOSIS — T8141XA Infection following a procedure, superficial incisional surgical site, initial encounter: Principal | ICD-10-CM | POA: Diagnosis present

## 2020-05-15 DIAGNOSIS — Y838 Other surgical procedures as the cause of abnormal reaction of the patient, or of later complication, without mention of misadventure at the time of the procedure: Secondary | ICD-10-CM | POA: Diagnosis present

## 2020-05-15 DIAGNOSIS — T847XXA Infection and inflammatory reaction due to other internal orthopedic prosthetic devices, implants and grafts, initial encounter: Secondary | ICD-10-CM

## 2020-05-15 DIAGNOSIS — L089 Local infection of the skin and subcutaneous tissue, unspecified: Secondary | ICD-10-CM | POA: Diagnosis present

## 2020-05-15 DIAGNOSIS — T148XXA Other injury of unspecified body region, initial encounter: Secondary | ICD-10-CM | POA: Diagnosis not present

## 2020-05-15 DIAGNOSIS — M868X8 Other osteomyelitis, other site: Secondary | ICD-10-CM | POA: Diagnosis present

## 2020-05-15 DIAGNOSIS — C719 Malignant neoplasm of brain, unspecified: Secondary | ICD-10-CM | POA: Diagnosis present

## 2020-05-15 DIAGNOSIS — T8149XA Infection following a procedure, other surgical site, initial encounter: Secondary | ICD-10-CM

## 2020-05-15 DIAGNOSIS — Z20822 Contact with and (suspected) exposure to covid-19: Secondary | ICD-10-CM | POA: Diagnosis present

## 2020-05-15 LAB — CREATININE, SERUM
Creatinine, Ser: 0.87 mg/dL (ref 0.61–1.24)
GFR, Estimated: >60 mL/min (ref >60)

## 2020-05-15 LAB — CBC WITH DIFFERENTIAL/PLATELET
Abs Immature Granulocytes: 0.03 10*3/uL (ref 0.00–0.07)
Abs Immature Granulocytes: 0.02 10*3/uL (ref 0.00–0.07)
Basophils Absolute: 0.1 10*3/uL (ref 0.0–0.1)
Basophils Absolute: 0.1 10*3/uL (ref 0.0–0.1)
Basophils Relative: 1 %
Basophils Relative: 1 %
Eosinophils Absolute: 0.2 10*3/uL (ref 0.0–0.5)
Eosinophils Absolute: 0.2 10*3/uL (ref 0.0–0.5)
Eosinophils Relative: 3 %
Eosinophils Relative: 2 %
HCT: 40.3 % (ref 39.0–52.0)
HCT: 43 % (ref 39.0–52.0)
Hemoglobin: 13.6 g/dL (ref 13.0–17.0)
Hemoglobin: 14.3 g/dL (ref 13.0–17.0)
Immature Granulocytes: 0 %
Immature Granulocytes: 0 %
Lymphocytes Relative: 32 %
Lymphocytes Relative: 27 %
Lymphs Abs: 2.3 10*3/uL (ref 0.7–4.0)
Lymphs Abs: 2.2 10*3/uL (ref 0.7–4.0)
MCH: 29.4 pg (ref 26.0–34.0)
MCH: 29.1 pg (ref 26.0–34.0)
MCHC: 33.7 g/dL (ref 30.0–36.0)
MCHC: 33.3 g/dL (ref 30.0–36.0)
MCV: 87.2 fL (ref 80.0–100.0)
MCV: 87.4 fL (ref 80.0–100.0)
Monocytes Absolute: 0.5 10*3/uL (ref 0.1–1.0)
Monocytes Absolute: 0.5 10*3/uL (ref 0.1–1.0)
Monocytes Relative: 7 %
Monocytes Relative: 7 %
Neutro Abs: 4.2 10*3/uL (ref 1.7–7.7)
Neutro Abs: 5 10*3/uL (ref 1.7–7.7)
Neutrophils Relative %: 57 %
Neutrophils Relative %: 63 %
Platelets: 305 10*3/uL (ref 150–400)
Platelets: 303 10*3/uL (ref 150–400)
RBC: 4.62 MIL/uL (ref 4.22–5.81)
RBC: 4.92 MIL/uL (ref 4.22–5.81)
RDW: 11.9 % (ref 11.5–15.5)
RDW: 12.1 % (ref 11.5–15.5)
WBC: 7.3 10*3/uL (ref 4.0–10.5)
WBC: 7.9 10*3/uL (ref 4.0–10.5)
nRBC: 0 % (ref 0.0–0.2)
nRBC: 0 % (ref 0.0–0.2)

## 2020-05-15 LAB — BASIC METABOLIC PANEL
Anion gap: 10 (ref 5–15)
BUN: 8 mg/dL (ref 6–20)
CO2: 27 mmol/L (ref 22–32)
Calcium: 9.9 mg/dL (ref 8.9–10.3)
Chloride: 102 mmol/L (ref 98–111)
Creatinine, Ser: 0.85 mg/dL (ref 0.61–1.24)
GFR, Estimated: >60 mL/min (ref >60)
Glucose, Bld: 98 mg/dL (ref 70–99)
Potassium: 4.2 mmol/L (ref 3.5–5.1)
Sodium: 139 mmol/L (ref 135–145)

## 2020-05-15 LAB — APTT: aPTT: 32 seconds (ref 24–36)

## 2020-05-15 LAB — PROTIME-INR
INR: 1 (ref 0.8–1.2)
Prothrombin Time: 13.3 seconds (ref 11.4–15.2)

## 2020-05-15 LAB — RESP PANEL BY RT-PCR (FLU A&B, COVID) ARPGX2
Influenza A by PCR: NEGATIVE
Influenza B by PCR: NEGATIVE
SARS Coronavirus 2 by RT PCR: NEGATIVE

## 2020-05-15 LAB — C-REACTIVE PROTEIN: CRP: 5.1 mg/dL — ABNORMAL HIGH (ref <1.0)

## 2020-05-15 LAB — SEDIMENTATION RATE: Sed Rate: 24 mm/hr — ABNORMAL HIGH (ref 0–16)

## 2020-05-15 MED ORDER — LEVETIRACETAM 750 MG PO TABS
750.0000 mg | ORAL_TABLET | Freq: Two times a day (BID) | ORAL | Status: DC
  Administered 2020-05-15 – 2020-05-18 (×6): 750 mg via ORAL
  Filled 2020-05-15 (×7): qty 1

## 2020-05-15 MED ORDER — SODIUM CHLORIDE 0.9 % IV SOLN
250.0000 mL | INTRAVENOUS | Status: DC | PRN
  Administered 2020-05-16: 1000 mL via INTRAVENOUS

## 2020-05-15 MED ORDER — SODIUM CHLORIDE 0.9% FLUSH
3.0000 mL | Freq: Two times a day (BID) | INTRAVENOUS | Status: DC
  Administered 2020-05-15 – 2020-05-18 (×4): 3 mL via INTRAVENOUS

## 2020-05-15 MED ORDER — ONDANSETRON HCL 4 MG PO TABS
4.0000 mg | ORAL_TABLET | Freq: Four times a day (QID) | ORAL | Status: DC | PRN
Start: 2020-05-15 — End: 2020-05-18

## 2020-05-15 MED ORDER — CEFDINIR 300 MG PO CAPS
300.0000 mg | ORAL_CAPSULE | Freq: Two times a day (BID) | ORAL | Status: DC
  Administered 2020-05-15 – 2020-05-16 (×2): 300 mg via ORAL
  Filled 2020-05-15 (×3): qty 1

## 2020-05-15 MED ORDER — GADOBUTROL 1 MMOL/ML IV SOLN
9.5000 mL | Freq: Once | INTRAVENOUS | Status: AC | PRN
  Administered 2020-05-15: 9.5 mL via INTRAVENOUS

## 2020-05-15 MED ORDER — HYDROCODONE-ACETAMINOPHEN 5-325 MG PO TABS
1.0000 | ORAL_TABLET | ORAL | Status: DC | PRN
  Administered 2020-05-18: 1 via ORAL
  Filled 2020-05-15: qty 1

## 2020-05-15 MED ORDER — ONDANSETRON HCL 4 MG/2ML IJ SOLN
4.0000 mg | Freq: Four times a day (QID) | INTRAMUSCULAR | Status: DC | PRN
  Administered 2020-05-17: 4 mg via INTRAVENOUS
  Filled 2020-05-15: qty 2

## 2020-05-15 MED ORDER — SENNOSIDES-DOCUSATE SODIUM 8.6-50 MG PO TABS: 1.0000 | ORAL_TABLET | Freq: Every evening | ORAL | Status: DC | PRN

## 2020-05-15 MED ORDER — HEPARIN SODIUM (PORCINE) 5000 UNIT/ML IJ SOLN
5000.0000 [IU] | Freq: Three times a day (TID) | INTRAMUSCULAR | Status: AC
  Administered 2020-05-15 – 2020-05-16 (×3): 5000 [IU] via SUBCUTANEOUS
  Filled 2020-05-15 (×4): qty 1

## 2020-05-15 MED ORDER — MORPHINE SULFATE (PF) 2 MG/ML IV SOLN
1.0000 mg | INTRAVENOUS | Status: DC | PRN
  Administered 2020-05-17: 1 mg via INTRAVENOUS
  Filled 2020-05-15: qty 1

## 2020-05-15 MED ORDER — SODIUM CHLORIDE 0.9% FLUSH: 3.0000 mL | INTRAVENOUS | Status: DC | PRN

## 2020-05-15 NOTE — Progress Notes (Signed)
Pt arrived to the unit, vital signs taken. Pt not in distress and tolerated well.

## 2020-05-15 NOTE — ED Notes (Signed)
Dr. Reatha Armour saw pt at triage and will put in admission labs.

## 2020-05-15 NOTE — H&P (Signed)
Providing Compassionate, Quality Care - Together  NEUROSURGERY HISTORY & PHYSICAL   Gordon [name] is an 25 y.o. male.   Chief Complaint: Wound drainage, recurrent HPI: This is a 25 year old male with a history of a left frontal grade 4 astrocytoma, status post craniotomy for resection on 04/04/2020 by [name].  He then presented 04/19/2020 with wound drainage and underwent an incision and drainage of his cranial wound with positive cultures of staph aureus.  He was placed on IV and oral antibiotics (cefdinir) and ultimately completed his course without issue.  He had a one-time fever of 100F on Wednesday and then began having similar wound drainage Friday evening from the anterior third of his wound.  The drainage is yellowish slightly thick material.  There is no frank dehiscence.  He denies any headache, numbness tingling or weakness.  Denies any seizure activity.  Past Medical History:  Diagnosis Date  . Bulging lumbar disc   . Complication of anesthesia   . PONV (postoperative nausea and vomiting)    Vomited x1 after craniotomy surgery  . Seizure ([place]) 04/03/2020   x 1 at home pre-admission.  Otherwise, no history.    Past Surgical History:  Procedure Laterality Date  . BURR HOLE Right 04/20/2020   Procedure: Irrigation and Debridement of Cranial Wound;  Surgeon: [name], MD;  Location: [facility];  Service: Neurosurgery;  Laterality: Right;  . CRANIOTOMY Left 04/04/2020   Procedure: LEFT CRANIOTOMY FOR TUMOR EXCISION;  Surgeon: [name], MD;  Location: [facility];  Service: Neurosurgery;  Laterality: Left;  . TONSILLECTOMY    . WISDOM TOOTH EXTRACTION      No family history on file. Social History:  reports that he has never smoked. He has never used smokeless tobacco. He reports that he does not drink alcohol and does not use drugs.  Allergies: No Known Allergies  (Not in a hospital admission)   Results for orders placed or performed during the  hospital encounter of 05/15/20 (from the past 48 hour(s))  Basic metabolic panel     Status: None   Collection Time: 05/15/20  3:56 PM  Result Value Ref Range   Sodium 139 135 - 145 mmol/L   Potassium 4.2 3.5 - 5.1 mmol/L   Chloride 102 98 - 111 mmol/L   CO2 27 22 - 32 mmol/L   Glucose, Bld 98 70 - 99 mg/dL    Comment: Glucose reference range applies only to samples taken after fasting for at least 8 hours.   BUN 8 6 - 20 mg/dL   Creatinine, Ser 0.85 0.61 - 1.24 mg/dL   Calcium 9.9 8.9 - 10.3 mg/dL   GFR, Estimated >60 >60 mL/min    Comment: (NOTE) Calculated using the CKD-EPI Creatinine Equation (2021)    Anion gap 10 5 - 15    Comment: Performed at [facility] [address] [place], [place] [address]  CBC with Differential/Platelet     Status: None   Collection Time: 05/15/20  3:56 PM  Result Value Ref Range   WBC 7.3 4.0 - 10.5 K/uL   RBC 4.62 4.22 - 5.81 MIL/uL   Hemoglobin 13.6 13.0 - 17.0 g/dL   HCT 40.3 39.0 - 52.0 %   MCV 87.2 80.0 - 100.0 fL   MCH 29.4 26.0 - 34.0 pg   MCHC 33.7 30.0 - 36.0 g/dL   RDW 11.9 11.5 - 15.5 %   Platelets 305 150 - 400 K/uL  nRBC 0.0 0.0 - 0.2 %   Neutrophils Relative % 57 %   Neutro Abs 4.2 1.7 - 7.7 K/uL   Lymphocytes Relative 32 %   Lymphs Abs 2.3 0.7 - 4.0 K/uL   Monocytes Relative 7 %   Monocytes Absolute 0.5 0.1 - 1.0 K/uL   Eosinophils Relative 3 %   Eosinophils Absolute 0.2 0.0 - 0.5 K/uL   Basophils Relative 1 %   Basophils Absolute 0.1 0.0 - 0.1 K/uL   Immature Granulocytes 0 %   Abs Immature Granulocytes 0.03 0.00 - 0.07 K/uL    Comment: Performed at [facility] [address], [place], [place] [address]   No results found.  ROS 14 point review of systems was obtained which all pertinent positives and negatives are listed in HPI above  Blood pressure 132/65, pulse 89, temperature 98.8 F (37.1 C), temperature source Oral, resp. rate 16, SpO2 99 %. Physical Exam   A&O x3, no acute  distress PERRLA Cranial nerves II through XII intact Symmetric strength bilaterally Sensory intact bilaterally No drift Has a gauze bandage taped over his forehead with yellow/brown fluid collected.  The anterior one third of his incision appears to be yellow, crusty without frank dehiscence.  The remainder the incision is well-healed.  It is nontender and mildly erythematous anteriorly.  Assessment/Plan  25 year old male with  1.  Recurrent wound infection, cranial 2.  Grade 4 astrocytoma, IDH mutant   -will continue/restart his oral antibiotics -Continue Keppra -Admit to MedSurg for further work-up -We will consult infectious disease -CT pending -Labs pending   Thank you for allowing me to participate in this patient's care.  Please do not hesitate to call with questions or concerns.   Elwin Sleight, [facility] Cell: [phone]

## 2020-05-15 NOTE — ED Provider Notes (Signed)
MSE was initiated and I personally evaluated the patient and placed orders (if any) at  3:08 PM on May 15, 2020.  Patient placed in Quick Look pathway, seen and evaluated   Chief Complaint: Scalp pain, discharge from wound  HPI:   Patient is a 25 year old male presented today after having some purulent discharge from a scalp wound from brain tumor removal. Patient was surgically irrigated and debrided 04/20/2020 by Dr. Venetia Constable of neurosurgery after he developed an infection  Patient states that he has been well since that time.  He states that he did have 1 fever on Tuesday of this week that he took Tylenol for and it resolved.  Has not recurred.  ROS: Scalp purulence (one)  Physical Exam:   Gen: No distress  Neuro: Awake and Alert  Skin: Warm    Focused Exam: What appears to be a well-healing surgical scar of the scalp.  There is no significant redness.  Some pink granulation tissue present.  There is some evidence of some yellowish discharge on the gauze-patients head.  RN has already discussed with Dr. Claybon Jabs of neurosurgery who will see pt in Triage.   Initiation of care has begun. The patient has been counseled on the process, plan, and necessity for staying for the completion/evaluation, and the remainder of the medical screening examination    The patient appears stable so that the remainder of the MSE may be completed by another provider.   Pati Gallo [place], [place] 05/15/20 1529    Quintella Reichert, MD 05/15/20 1655

## 2020-05-15 NOTE — ED Provider Notes (Signed)
[facility] EMERGENCY DEPARTMENT Provider Note   CSN: [id number] Arrival date & time: 05/15/20  1454     History Chief Complaint  Patient presents with  . Wound Infection    Gordon [name] is a 25 y.o. male.  HPI Patient is a 25 year old male presented today after having some purulent discharge from a scalp wound from brain tumor removal. Patient was surgically irrigated and debrided 04/20/2020 by Dr. Venetia Constable of neurosurgery after he developed an infection of his cranium after neurosurgery to remove tumor.  Patient states that he has been well since that time.  He states that he did have 1 fever on Tuesday of this week that he took Tylenol for and it resolved.  Has not recurred.  Patient denies any weakness or numbness.  No chest pain or shortness of breath.  No other associate symptoms.    Past Medical History:  Diagnosis Date  . Bulging lumbar disc   . Complication of anesthesia   . PONV (postoperative nausea and vomiting)    Vomited x1 after craniotomy surgery  . Seizure ([place]) 04/03/2020   x 1 at home pre-admission.  Otherwise, no history.    Patient Active Problem List   Diagnosis Date Noted  . Wound infection after surgery 04/19/2020  . 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 Brain mass 04/02/2020  . Seizure ([place]) 04/02/2020  . Left knee injury, subsequent encounter 06/13/2017  . Low back pain 03/25/2014    Past Surgical History:  Procedure Laterality Date  . BURR HOLE Right 04/20/2020   Procedure: Irrigation and Debridement of Cranial Wound;  Surgeon: [name], MD;  Location: [facility];  Service: Neurosurgery;  Laterality: Right;  . CRANIOTOMY Left 04/04/2020   Procedure: LEFT CRANIOTOMY FOR TUMOR EXCISION;  Surgeon: [name], MD;  Location: [facility];  Service: Neurosurgery;  Laterality: Left;  . TONSILLECTOMY    . WISDOM TOOTH EXTRACTION         No family history on file.  Social History   Tobacco Use  . Smoking  status: Never Smoker  . Smokeless tobacco: Never Used  Vaping Use  . Vaping Use: Never used  Substance Use Topics  . Alcohol use: No    Alcohol/week: 0.0 standard drinks  . Drug use: No    Home Medications Prior to Admission medications   Medication Sig Start Date End Date Taking? Authorizing Provider  cefdinir (OMNICEF) 300 MG capsule Take 1 capsule (300 mg total) by mouth every 12 (twelve) hours. 04/22/20   [name], MD  HYDROcodone-acetaminophen (NORCO/VICODIN) 5-325 MG tablet Take 1 tablet by mouth every 4 (four) hours as needed for moderate pain. Patient not taking: Reported on 05/12/2020 04/06/20   [name], MD  ibuprofen (ADVIL) 200 MG tablet Take 200-400 mg by mouth as needed (pain).    [provider]  levETIRAcetam (KEPPRA) 750 MG tablet Take 1 tablet (750 mg total) by mouth 2 (two) times daily. 04/06/20   [name], MD  Multiple Vitamin (MULTIVITAMIN PO) Take 1 capsule by mouth daily.    [provider]  Omega-3 Fatty Acids (OMEGA 3 PO) Take 1 capsule by mouth daily.    [provider]  ondansetron (ZOFRAN) 8 MG tablet Take 1 tablet (8 mg total) by mouth 2 (two) times daily as needed (nausea and vomiting). May take 30-60 minutes prior to Temodar administration if nausea/vomiting occurs. 04/30/20   Ventura Sellers, MD  Probiotic Product (PROBIOTIC PO) Take  1 capsule by mouth daily.    [provider]  temozolomide (TEMODAR) 140 MG capsule Take 1 capsule (140 mg total) by mouth daily. May take on an empty stomach to decrease nausea & vomiting. 04/30/20   Ventura Sellers, MD  temozolomide (TEMODAR) 20 MG capsule Take 1 capsule (20 mg total) by mouth daily. May take on an empty stomach to decrease nausea & vomiting. 04/30/20   Ventura Sellers, MD    Allergies    Patient has no known allergies.  Review of Systems   Review of Systems  Constitutional: Negative for chills and fever.  HENT: Negative for congestion.    Respiratory: Negative for shortness of breath.   Cardiovascular: Negative for chest pain.  Gastrointestinal: Negative for abdominal pain.  Musculoskeletal: Negative for neck pain.  Skin:       Surgical wound to scalp with some purulence    Physical Exam Updated Vital Signs BP 132/65 (BP Location: Right Arm)   Pulse 89   Temp 98.8 F (37.1 C) (Oral)   Resp 16   SpO2 99%   Physical Exam Vitals and nursing note reviewed.  Constitutional:      General: He is not in acute distress.    Appearance: Normal appearance. He is not ill-appearing.  HENT:     Head: Normocephalic and atraumatic.  Eyes:     General: No scleral icterus.       Right eye: No discharge.        Left eye: No discharge.     Conjunctiva/sclera: Conjunctivae normal.  Pulmonary:     Effort: Pulmonary effort is normal.     Breath sounds: No stridor.  Skin:    Comments: There is surgical wound to the midline scalp sagittally oriented with some pink skin on either end of incision.  Seems to be healing there is some purulence that seems to have been expressed from underneath the incision site.  It is yellow in color.  Neurological:     Mental Status: He is alert and oriented to person, place, and time. Mental status is at baseline.     ED Results / Procedures / Treatments   Labs (all labs ordered are listed, but only abnormal results are displayed) Labs Reviewed  BASIC METABOLIC PANEL  CBC WITH DIFFERENTIAL/PLATELET  C-REACTIVE PROTEIN  SEDIMENTATION RATE  CBC  CREATININE, SERUM  APTT  PROTIME-INR  CBC WITH DIFFERENTIAL/PLATELET    EKG None  Radiology No results found.  Procedures Procedures   Medications Ordered in ED Medications  HYDROcodone-acetaminophen (NORCO/VICODIN) 5-325 MG per tablet 1 tablet (has no administration in time range)  cefdinir (OMNICEF) capsule 300 mg (has no administration in time range)  levETIRAcetam (KEPPRA) tablet 750 mg (has no administration in time range)  heparin  injection 5,000 Units (has no administration in time range)  sodium chloride flush (NS) 0.9 % injection 3 mL (has no administration in time range)  sodium chloride flush (NS) 0.9 % injection 3 mL (has no administration in time range)  0.9 %  sodium chloride infusion (has no administration in time range)  morphine 2 MG/ML injection 1 mg (has no administration in time range)  senna-docusate (Senokot-S) tablet 1 tablet (has no administration in time range)  ondansetron (ZOFRAN) tablet 4 mg (has no administration in time range)    Or  ondansetron (ZOFRAN) injection 4 mg (has no administration in time range)    ED Course  I have reviewed the triage vital signs and the nursing notes.  Pertinent labs & imaging results that were available during my care of the patient were reviewed by me and considered in my medical decision making (see chart for details).    MDM Rules/Calculators/A&P                          Patient is 25 year old male presented today with wound infection of the scalp.  See PE for details.  There is some purulence on gauze that is outside of wound.  I was not able to appreciate any fluctuance however given this presentation and that is a deep surgical incision we will obtain lab work and anticipate admission.  CBC without leukocytosis or anemia.  BMP unremarkable.  RN discussed with neurosurgeon who will admit.  Final Clinical Impression(s) / ED Diagnoses Final diagnoses:  Wound infection    Rx / DC Orders ED Discharge Orders    None       Tedd Sias, [place] 05/15/20 1638    Quintella Reichert, MD 05/15/20 1655

## 2020-05-15 NOTE — ED Triage Notes (Addendum)
Pt c/o wound drainage after a craniotomy on 3/13.  Reports fever a few night ago.  Denies chills.  Dr. Reatha Armour notified of pt's arrival.

## 2020-05-16 DIAGNOSIS — Y838 Other surgical procedures as the cause of abnormal reaction of the patient, or of later complication, without mention of misadventure at the time of the procedure: Secondary | ICD-10-CM | POA: Diagnosis present

## 2020-05-16 DIAGNOSIS — Z85841 Personal history of malignant neoplasm of brain: Secondary | ICD-10-CM | POA: Diagnosis not present

## 2020-05-16 DIAGNOSIS — Z20822 Contact with and (suspected) exposure to covid-19: Secondary | ICD-10-CM | POA: Diagnosis present

## 2020-05-16 DIAGNOSIS — T148XXA Other injury of unspecified body region, initial encounter: Secondary | ICD-10-CM | POA: Diagnosis present

## 2020-05-16 DIAGNOSIS — T8141XA Infection following a procedure, superficial incisional surgical site, initial encounter: Secondary | ICD-10-CM | POA: Diagnosis present

## 2020-05-16 DIAGNOSIS — T8149XA Infection following a procedure, other surgical site, initial encounter: Secondary | ICD-10-CM | POA: Diagnosis not present

## 2020-05-16 DIAGNOSIS — C719 Malignant neoplasm of brain, unspecified: Secondary | ICD-10-CM

## 2020-05-16 DIAGNOSIS — M868X8 Other osteomyelitis, other site: Secondary | ICD-10-CM | POA: Diagnosis present

## 2020-05-16 DIAGNOSIS — L089 Local infection of the skin and subcutaneous tissue, unspecified: Principal | ICD-10-CM | POA: Diagnosis present

## 2020-05-16 DIAGNOSIS — T847XXD Infection and inflammatory reaction due to other internal orthopedic prosthetic devices, implants and grafts, subsequent encounter: Secondary | ICD-10-CM | POA: Diagnosis not present

## 2020-05-16 MED ORDER — VANCOMYCIN HCL 10 G IV SOLR
2500.0000 mg | Freq: Once | INTRAVENOUS | Status: AC
  Administered 2020-05-16: 2500 mg via INTRAVENOUS
  Filled 2020-05-16: qty 2500

## 2020-05-16 MED ORDER — SODIUM CHLORIDE 0.9 % IV SOLN
2.0000 g | Freq: Three times a day (TID) | INTRAVENOUS | Status: DC
  Administered 2020-05-16 – 2020-05-18 (×5): 2 g via INTRAVENOUS
  Filled 2020-05-16 (×5): qty 2

## 2020-05-16 MED ORDER — VANCOMYCIN HCL 1250 MG/250ML IV SOLN
1250.0000 mg | Freq: Three times a day (TID) | INTRAVENOUS | Status: DC
  Administered 2020-05-16 – 2020-05-18 (×4): 1250 mg via INTRAVENOUS
  Filled 2020-05-16 (×5): qty 250

## 2020-05-16 NOTE — Progress Notes (Signed)
Infectious disease on call provider informed of reaction to vancomycin. Received order to infuse vancomycin  at a slower rate with the next dose and observed patient closely.

## 2020-05-16 NOTE — Plan of Care (Signed)
  Problem: Activity: Goal: Risk for activity intolerance will decrease Outcome: Progressing   Problem: Coping: Goal: Level of anxiety will decrease Outcome: Progressing   Problem: Pain Managment: Goal: General experience of comfort will improve Outcome: Progressing   Problem: Safety: Goal: Ability to remain free from injury will improve Outcome: Progressing   Problem: Skin Integrity: Goal: Risk for impaired skin integrity will decrease Outcome: Progressing   

## 2020-05-16 NOTE — Progress Notes (Signed)
Pharmacy Antibiotic Note  Gordon [name] is a 25 y.o. male admitted on 05/15/2020 with wound infection following neurosurgery in late March.  Pharmacy has been consulted for vancomycin and cefepime dosing.  WBC wnl, afebrile, ESR 24, CRP 5.1. Will start vancomycin 2512m x1 (~27 mg/kg) followed by 12578mq8hr for estimated AUC 418 (Scr used = 0.87). Will start cefepime 2g q8hr. Pharmacy to follow renal function and further work-up.  Plan: Start vancomycin 250026m1 then 1500m59mhr Start cefepime 2g q8hr     Temp (24hrs), Avg:98.4 F (36.9 C), Min:97.8 F (36.6 C), Max:98.8 F (37.1 C)  Recent Labs  Lab 05/15/20 1556 05/15/20 1723  WBC 7.3 7.9  CREATININE 0.85 0.87    Estimated Creatinine Clearance: 157.8 mL/min (by C-G formula based on SCr of 0.87 mg/dL).    No Known Allergies  Antimicrobials this admission: 4/24 vanc >> 4/24 cefepime >>  Microbiology results: 3/29 wcx: MSSA + Propionibacterium acnes  Thank you for allowing pharmacy to be a part of this patient's care.  AmanAlfonse SprucearmD PGY2 ID Pharmacy Resident Phone between 7 am - 3:30 pm: [phone] check AMION for all [facility] numbers After 10:00 PM, call [phone]/24/2022 1:57 PM

## 2020-05-16 NOTE — Progress Notes (Deleted)
Patient called c/o itching from head to waistline. Noted redness to face and neck arms and trunk.Vacomycin infusion stopped. Normal Saline infusion started at 50cc/he pending MD return page.

## 2020-05-16 NOTE — Consult Note (Signed)
[facility]    Date of Admission:  05/15/2020     Reason for Consult: Wound infection following neurosurgery     Referring Physician: Dr Dawley  Current antibiotics: Cefdinir since admission   ASSESSMENT & PLAN:    1. 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  Complicated by postoperative wound drainage and return to the OR on 04/20/2020 for incision and drainage.  Operative note reviewed which indicated immediate purulence upon opening the incision.  There did not appear to be significant deeper infection with no purulence around the bone edges and no evidence of osteomyelitis at that time.  Cultures grew MSSA and late growth of Propionibacterium acnes.  Initially treated with vancomycin and piperacillin tazobactam, but subsequently transitioned to oral cefdinir to complete about 10 days of coverage at discharge.  Now with recurrent drainage from his wound and planning to go back to the OR tomorrow 05/17/2020 for repeat I&D. 2. 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    Will start vancomycin and cefepime per pharmacy in the setting of recent neurosurgical procedures and hardware placement pending return to the OR tomorrow where fresh cultures will be obtained.  Recommend holding OR cultures for 14 days due to prior growth of P acnes.  Stop cefdinir.   Will follow  Dr West Bali or Dr Gale Journey will be back tomorrow    Principal Problem:   Wound infection after surgery Active Problems:   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HCC)   MEDICATIONS:    Scheduled Meds: . heparin  5,000 Units Subcutaneous Q8H  . levETIRAcetam  750 mg Oral BID  . sodium chloride flush  3 mL Intravenous Q12H   Continuous Infusions: . sodium chloride     PRN Meds:.sodium chloride, HYDROcodone-acetaminophen, morphine injection,  ondansetron **OR** ondansetron (ZOFRAN) IV, senna-docusate, sodium chloride flush  HPI:    Gordon [name] is a 25 y.o. male with a past medical history of left frontal grade 4 astrocytoma status post craniotomy for resection on 04/04/2020 by neurosurgery.  He subsequently presented 04/19/2020 with wound drainage and underwent I&D.  He was treated with vancomycin and piperacillin tazobactam.  Cultures from the OR grew MSSA and late growth of P acnes.  He was transitioned to oral cefdinir at time of discharge on 3/31 for 10 additional days.  He completed this course of antibiotics without issue and drainage resolved.  He had a low-grade temperature last Wednesday to 87 F and then began having similar wound drainage Friday evening from his wound.  The drainage was described as thick and yellowish in color.  There was no frank wound dehiscence.  Patient denied any headaches, focal weakness, numbness, tingling, seizure activity, urinary symptoms, GI symptoms, respiratory symptoms, chest pain.  He does endorse some neck pain that has been present since his initial surgery but has not worsened and he has no meningismus. He was admitted from and started back on oral cefdinir for antimicrobial coverage.  His labs are significant for normal WBC, CRP and ESR mildly increased.  He underwent CT of the head on admission which showed evolving postoperative changes.  MRI completed overnight with nonspecific contrast-enhancement surrounding the resection cavity that may be postsurgical.  Also with decreased size of a scalp fluid collection overlying the craniotomy site with no specific features of infection.  Dural thickening underlying the craniotomy was noted and felt to likely  be reactive.  Discussed with neurosurgery who felt that it is difficult to differentiate whether these changes are significant from an infectious standpoint or merely postsurgical given he is only a few weeks out from his recent surgical  procedures.  He is planning to go back to the OR tomorrow for repeat incision and drainage.   Past Medical History:  Diagnosis Date  . Bulging lumbar disc   . Complication of anesthesia   . PONV (postoperative nausea and vomiting)    Vomited x1 after craniotomy surgery  . Seizure ([place]) 04/03/2020   x 1 at home pre-admission.  Otherwise, no history.    Social History   Tobacco Use  . Smoking status: Never Smoker  . Smokeless tobacco: Never Used  Vaping Use  . Vaping Use: Never used  Substance Use Topics  . Alcohol use: No    Alcohol/week: 0.0 standard drinks  . Drug use: No   No pertinent family history   No Known Allergies  Review of Systems  Constitutional: Positive for fever.  HENT:       + drainage from scalp wound  Eyes: Negative.   Respiratory: Negative.   Cardiovascular: Negative.   Gastrointestinal: Negative.   Genitourinary: Negative.   Musculoskeletal: Positive for neck pain.  Skin: Negative.   Neurological: Negative.   All other systems reviewed and are negative.   OBJECTIVE:   Blood pressure 122/67, pulse 80, temperature 98.4 F (36.9 C), temperature source Oral, resp. rate 20, SpO2 100 %. There is no height or weight on file to calculate BMI.  Physical Exam Constitutional:      General: He is not in acute distress.    Appearance: Normal appearance.  HENT:     Head: Normocephalic and atraumatic.     Comments: Bandage covering scalp wound without strike through    Nose: Nose normal.  Eyes:     Extraocular Movements: Extraocular movements intact.     Conjunctiva/sclera: Conjunctivae normal.  Pulmonary:     Effort: Pulmonary effort is normal. No respiratory distress.  Abdominal:     General: There is no distension.  Musculoskeletal:     Cervical back: Normal range of motion and neck supple.     Right lower leg: No edema.     Left lower leg: No edema.  Skin:    General: Skin is warm and dry.     Findings: No rash.  Neurological:      General: No focal deficit present.     Mental Status: He is alert and oriented to person, place, and time.  Psychiatric:        Mood and Affect: Mood normal.        Behavior: Behavior normal.      Lab Results: Lab Results  Component Value Date   WBC 7.9 05/15/2020   HGB 14.3 05/15/2020   HCT 43.0 05/15/2020   MCV 87.4 05/15/2020   PLT 303 05/15/2020    Lab Results  Component Value Date   NA 139 05/15/2020   K 4.2 05/15/2020   CO2 27 05/15/2020   GLUCOSE 98 05/15/2020   BUN 8 05/15/2020   CREATININE 0.87 05/15/2020   CALCIUM 9.9 05/15/2020   GFRNONAA >60 05/15/2020   GFRAA  08/10/2009    NOT CALCULATED        The eGFR has been calculated using the MDRD equation. This calculation has not been validated in all clinical situations. eGFR's persistently <60 mL/min signify possible Chronic Kidney Disease.    Lab  Results  Component Value Date   ALT 22 04/19/2020   AST 18 04/19/2020   ALKPHOS 44 04/19/2020   BILITOT 0.7 04/19/2020       Component Value Date/Time   CRP 5.1 (H) 05/15/2020 1723       Component Value Date/Time   ESRSEDRATE 24 (H) 05/15/2020 1723    I have reviewed the micro and lab results in Epic.  Imaging: CT HEAD WO CONTRAST  Result Date: 05/15/2020 CLINICAL DATA:  Recurrent wound infection. History of left frontal grade 4 astrocytoma status post craniotomy for resection on 04/04/2020 followed by incision and drainage of infected cranial wound on 04/20/2020. EXAM: CT HEAD WITHOUT CONTRAST TECHNIQUE: Contiguous axial images were obtained from the base of the skull through the vertex without intravenous contrast. COMPARISON:  Head MRI 04/04/2020 FINDINGS: Brain: Sequelae of left frontal craniotomy and tumor resection are again identified. The resection cavity in the paramedian left frontal lobe has contracted since the prior MRI. There is a small amount of hypodensity in the white matter adjacent to the anteroinferior aspect of the cavity which is  similar in extent to the T2 signal abnormality on the prior MRI. There is no mass effect. There is suspected postoperative dural thickening subjacent to the craniotomy and along the falx in this region. No acute infarct, acute intracranial hemorrhage, or sizable extra-axial fluid collection is identified. The ventricles are normal in size aside from mild ex vacuo dilatation of the left frontal horn. Vascular: No hyperdense vessel. Skull: Left frontal craniotomy with mild thickening of the overlying scalp soft tissues but no evidence of a drainable fluid collection. Sinuses/Orbits: Visualized paranasal sinuses and mastoid air cells are clear. Unremarkable orbits. Other: None. IMPRESSION: 1. Evolving postoperative changes from left frontal tumor resection as above. 2. No evidence of acute intracranial abnormality. Electronically Signed   By: Logan Bores M.D.   On: 05/15/2020 17:45   MR BRAIN W WO CONTRAST  Result Date: 05/15/2020 CLINICAL DATA:  Dehiscent sub scalp wound. EXAM: MRI HEAD WITHOUT AND WITH CONTRAST TECHNIQUE: Multiplanar, multiecho pulse sequences of the brain and surrounding structures were obtained without and with intravenous contrast. CONTRAST:  9.29m GADAVIST GADOBUTROL 1 MMOL/ML IV SOLN COMPARISON:  None. FINDINGS: Brain: Recent left frontal tumor resection. Blood products at the resection site of decreased. There is mild dural thickening underlying the craniotomy site. There is nonspecific contrast enhancement surrounding the resection cavity. Mild hyperintense T2-weighted signal at the resection site. Brain parenchyma is otherwise normal. Vascular: Major flow voids are preserved. Skull and upper cervical spine: Left frontoparietal craniotomy. Small fluid collection within the scalp overlying the craniotomy site has decreased in size. There are no specific features of infection. Sinuses/Orbits:No paranasal sinus fluid levels or advanced mucosal thickening. No mastoid or middle ear effusion.  Normal orbits. IMPRESSION: 1. Status post left frontal tumor resection with decreased blood products at the resection site. Nonspecific contrast enhancement surrounding the resection cavity may be postsurgical. 2. Decreased size of scalp fluid collection overlying the craniotomy site. No specific features of infection. 3. Dural thickening underlying the craniotomy, likely reactive. Electronically Signed   By: KUlyses JarredM.D.   On: 05/15/2020 22:44     Imaging independently reviewed in Epic.  ARaynelle Highlandfor Infectious Disease [facility] [phone] 05/16/2020, 1:36 PM

## 2020-05-16 NOTE — Progress Notes (Addendum)
  Patient called with complains of itching head and neck. Noted redness to face neck and trunk. Vancomycin infusion stopped. Started normal line infusion at 50cc/hr. Ferne Reus MD made aware. I will inform infectious disease team as well.       05/16/20 1620  Vitals  Temp 98.3 F (36.8 C)  Temp Source Oral  BP 120/71  MAP (mmHg) 78  BP Location Right Arm  BP Method Automatic  Patient Position (if appropriate) Lying  Level of Consciousness  Level of Consciousness Alert  MEWS COLOR  MEWS Score Color Green  Oxygen Therapy  SpO2 99 %  O2 Device Room Air  MEWS Score  MEWS Temp 0  MEWS Systolic 0  MEWS Pulse 0  MEWS RR 0  MEWS LOC 0  MEWS Score 0

## 2020-05-16 NOTE — Progress Notes (Signed)
   Providing Compassionate, Quality Care - Together  NEUROSURGERY PROGRESS NOTE   S: No issues overnight. Continued drainage on dressing  O: EXAM:  BP 122/67 (BP Location: Right Arm)   Pulse 80   Temp 98.4 F (36.9 C) (Oral)   Resp 20   SpO2 100%   Awake, alert, oriented  Speech fluent, appropriate  CNs grossly intact  5/5 BUE/BLE  Sensory intact bilaterally Anterior one third of the incision is yellow crusty, some yellow drainage noted on the bandage  ASSESSMENT:  25 y.o. male with   1.  Recurrent wound infection, cranial 2.  Grade 4 astrocytoma, IDH mutant  PLAN: -Consult infectious disease for evaluation -Planned OR tomorrow for incision and drainage of wound -Continue antibiotics -CT/MRI reviewed, no frank signs of infectious process -Updated patient's mother via phone -NPO midnight    Thank you for allowing me to participate in this patient's care.  Please do not hesitate to call with questions or concerns.   Elwin Sleight, [facility] Cell: [phone]

## 2020-05-17 ENCOUNTER — Inpatient Hospital Stay (HOSPITAL_COMMUNITY): Payer: 59

## 2020-05-17 ENCOUNTER — Encounter (HOSPITAL_COMMUNITY): Payer: Self-pay | Admitting: Neurological Surgery

## 2020-05-17 ENCOUNTER — Ambulatory Visit: Payer: 59 | Admitting: Radiation Oncology

## 2020-05-17 ENCOUNTER — Encounter (HOSPITAL_COMMUNITY)
Admission: EM | Disposition: A | Discharge status: Home / Self Care | Payer: Self-pay | Source: Home / Self Care | Attending: Neurological Surgery

## 2020-05-17 HISTORY — PX: WOUND EXPLORATION: SHX6188

## 2020-05-17 LAB — SURGICAL PCR SCREEN
MRSA, PCR: NEGATIVE
Staphylococcus aureus: NEGATIVE

## 2020-05-17 SURGERY — WOUND EXPLORATION
Anesthesia: General | Site: Head | Laterality: Left

## 2020-05-17 MED ORDER — PROPOFOL 500 MG/50ML IV EMUL
INTRAVENOUS | Status: DC | PRN
  Administered 2020-05-17: 200 mg via INTRAVENOUS

## 2020-05-17 MED ORDER — BACITRACIN ZINC 500 UNIT/GM EX OINT
TOPICAL_OINTMENT | CUTANEOUS | Status: AC
  Filled 2020-05-17: qty 28.35

## 2020-05-17 MED ORDER — CHLORHEXIDINE GLUCONATE 0.12 % MT SOLN: 15.0000 mL | Freq: Once | OROMUCOSAL | Status: AC

## 2020-05-17 MED ORDER — GLYCOPYRROLATE PF 0.2 MG/ML IJ SOSY
PREFILLED_SYRINGE | INTRAMUSCULAR | Status: AC
  Filled 2020-05-17: qty 1

## 2020-05-17 MED ORDER — ORAL CARE MOUTH RINSE: 15.0000 mL | Freq: Once | OROMUCOSAL | Status: AC

## 2020-05-17 MED ORDER — BACITRACIN ZINC 500 UNIT/GM EX OINT
TOPICAL_OINTMENT | CUTANEOUS | Status: DC | PRN
  Administered 2020-05-17: 1 via TOPICAL

## 2020-05-17 MED ORDER — MIDAZOLAM HCL 2 MG/2ML IJ SOLN
INTRAMUSCULAR | Status: AC
  Filled 2020-05-17: qty 2

## 2020-05-17 MED ORDER — DEXAMETHASONE SODIUM PHOSPHATE 10 MG/ML IJ SOLN
INTRAMUSCULAR | Status: AC
  Filled 2020-05-17: qty 2

## 2020-05-17 MED ORDER — OXYCODONE HCL 5 MG PO TABS: 5.0000 mg | ORAL_TABLET | Freq: Once | ORAL | Status: DC | PRN

## 2020-05-17 MED ORDER — THROMBIN 5000 UNITS EX SOLR
CUTANEOUS | Status: AC
  Filled 2020-05-17: qty 5000

## 2020-05-17 MED ORDER — AMISULPRIDE (ANTIEMETIC) 5 MG/2ML IV SOLN: 10.0000 mg | Freq: Once | INTRAVENOUS | Status: DC | PRN

## 2020-05-17 MED ORDER — FENTANYL CITRATE (PF) 250 MCG/5ML IJ SOLN
INTRAMUSCULAR | Status: AC
  Filled 2020-05-17: qty 5

## 2020-05-17 MED ORDER — PROPOFOL 10 MG/ML IV BOLUS
INTRAVENOUS | Status: AC
  Filled 2020-05-17: qty 20

## 2020-05-17 MED ORDER — FENTANYL CITRATE (PF) 100 MCG/2ML IJ SOLN
25.0000 ug | INTRAMUSCULAR | Status: DC | PRN
  Administered 2020-05-17: 50 ug via INTRAVENOUS

## 2020-05-17 MED ORDER — DEXAMETHASONE SODIUM PHOSPHATE 10 MG/ML IJ SOLN
INTRAMUSCULAR | Status: DC | PRN
  Administered 2020-05-17: 10 mg via INTRAVENOUS

## 2020-05-17 MED ORDER — SUGAMMADEX SODIUM 200 MG/2ML IV SOLN
INTRAVENOUS | Status: DC | PRN
  Administered 2020-05-17: 200 mg via INTRAVENOUS

## 2020-05-17 MED ORDER — FENTANYL CITRATE (PF) 250 MCG/5ML IJ SOLN
INTRAMUSCULAR | Status: DC | PRN
  Administered 2020-05-17 (×3): 50 ug via INTRAVENOUS
  Administered 2020-05-17: 100 ug via INTRAVENOUS

## 2020-05-17 MED ORDER — ONDANSETRON HCL 4 MG/2ML IJ SOLN
INTRAMUSCULAR | Status: AC
  Filled 2020-05-17: qty 2

## 2020-05-17 MED ORDER — 0.9 % SODIUM CHLORIDE (POUR BTL) OPTIME
TOPICAL | Status: DC | PRN
  Administered 2020-05-17: 3000 mL

## 2020-05-17 MED ORDER — FENTANYL CITRATE (PF) 100 MCG/2ML IJ SOLN
INTRAMUSCULAR | Status: AC
  Administered 2020-05-17: 50 ug via INTRAVENOUS
  Filled 2020-05-17: qty 2

## 2020-05-17 MED ORDER — MIDAZOLAM HCL 2 MG/2ML IJ SOLN
INTRAMUSCULAR | Status: DC | PRN
  Administered 2020-05-17 (×2): 1 mg via INTRAVENOUS

## 2020-05-17 MED ORDER — LIDOCAINE 2% (20 MG/ML) 5 ML SYRINGE
INTRAMUSCULAR | Status: AC
  Filled 2020-05-17: qty 5

## 2020-05-17 MED ORDER — OXYCODONE HCL 5 MG/5ML PO SOLN: 5.0000 mg | Freq: Once | ORAL | Status: DC | PRN

## 2020-05-17 MED ORDER — LACTATED RINGERS IV SOLN: INTRAVENOUS | Status: DC

## 2020-05-17 MED ORDER — EPHEDRINE 5 MG/ML INJ
INTRAVENOUS | Status: AC
  Filled 2020-05-17: qty 10

## 2020-05-17 MED ORDER — CHLORHEXIDINE GLUCONATE 0.12 % MT SOLN
OROMUCOSAL | Status: AC
  Administered 2020-05-17: 15 mL via OROMUCOSAL
  Filled 2020-05-17: qty 15

## 2020-05-17 MED ORDER — ROCURONIUM BROMIDE 10 MG/ML (PF) SYRINGE
PREFILLED_SYRINGE | INTRAVENOUS | Status: AC
  Filled 2020-05-17: qty 10

## 2020-05-17 MED ORDER — ACETAMINOPHEN 10 MG/ML IV SOLN: 1000.0000 mg | Freq: Once | INTRAVENOUS | Status: DC | PRN

## 2020-05-17 MED ORDER — THROMBIN 5000 UNITS EX SOLR: OROMUCOSAL | Status: DC | PRN

## 2020-05-17 MED ORDER — CHLORHEXIDINE GLUCONATE CLOTH 2 % EX PADS
6.0000 | MEDICATED_PAD | Freq: Every day | CUTANEOUS | Status: DC
  Administered 2020-05-18: 6 via TOPICAL

## 2020-05-17 MED ORDER — LIDOCAINE HCL (CARDIAC) PF 100 MG/5ML IV SOSY
PREFILLED_SYRINGE | INTRAVENOUS | Status: DC | PRN
  Administered 2020-05-17: 60 mg via INTRATRACHEAL

## 2020-05-17 MED ORDER — ROCURONIUM BROMIDE 100 MG/10ML IV SOLN
INTRAVENOUS | Status: DC | PRN
  Administered 2020-05-17: 10 mg via INTRAVENOUS
  Administered 2020-05-17: 60 mg via INTRAVENOUS

## 2020-05-17 SURGICAL SUPPLY — 24 items
BENZOIN TINCTURE PRP APPL 2/3 (GAUZE/BANDAGES/DRESSINGS) ×2 IMPLANT
CANISTER SUCT 3000ML PPV (MISCELLANEOUS) ×4 IMPLANT
DRAPE WARM FLUID 44X44 (DRAPES) ×2 IMPLANT
ELECT REM PT RETURN 9FT ADLT (ELECTROSURGICAL) ×2
ELECTRODE REM PT RTRN 9FT ADLT (ELECTROSURGICAL) ×1 IMPLANT
GOWN STRL REUS W/ TWL LRG LVL3 (GOWN DISPOSABLE) ×1 IMPLANT
GOWN STRL REUS W/ TWL XL LVL3 (GOWN DISPOSABLE) ×2 IMPLANT
GOWN STRL REUS W/TWL LRG LVL3 (GOWN DISPOSABLE) ×1
GOWN STRL REUS W/TWL XL LVL3 (GOWN DISPOSABLE) ×2
KIT BASIN OR (CUSTOM PROCEDURE TRAY) ×2 IMPLANT
KIT TURNOVER KIT B (KITS) ×2 IMPLANT
MESH PREFORM STD 90X90X0.6 (Mesh General) ×2 IMPLANT
NS IRRIG 1000ML POUR BTL (IV SOLUTION) ×2 IMPLANT
PACK BATTERY CMF DISP FOR DVR (ORTHOPEDIC DISPOSABLE SUPPLIES) ×2 IMPLANT
PACK LAMINECTOMY NEURO (CUSTOM PROCEDURE TRAY) ×2 IMPLANT
SCREW UNIII AXS SD 1.5X4 (Screw) ×20 IMPLANT
STRIP CLOSURE SKIN 1/2X4 (GAUZE/BANDAGES/DRESSINGS) ×2 IMPLANT
SUT ETHILON 3 0 FSL (SUTURE) ×2 IMPLANT
SUT VIC AB 2-0 CP2 18 (SUTURE) ×4 IMPLANT
SWAB COLLECTION DEVICE MRSA (MISCELLANEOUS) ×4 IMPLANT
SWAB CULTURE ESWAB REG 1ML (MISCELLANEOUS) ×4 IMPLANT
TOWEL GREEN STERILE (TOWEL DISPOSABLE) ×2 IMPLANT
TOWEL GREEN STERILE FF (TOWEL DISPOSABLE) ×2 IMPLANT
WATER STERILE IRR 1000ML POUR (IV SOLUTION) ×2 IMPLANT

## 2020-05-17 NOTE — Op Note (Signed)
PATIENT: Gordon [name]  DAY OF SURGERY: 05/17/20   PRE-OPERATIVE DIAGNOSIS:  Cranial wound infection   POST-OPERATIVE DIAGNOSIS:  Cranial wound infection, skull osteomyelitis   PROCEDURE:  Cranial wound washout, removal of bone flap, excisional debridement of infected skull, mesh cranioplasty   SURGEON:  Surgeon(s) and Role:    [name], MD - Primary   ANESTHESIA: ETGA   BRIEF HISTORY: This is a 25 year old man who unfortunately presented with recurrent purulent drainage from his craniotomy wound. Given the recurrence, I recommended washout with removal of the bone flap and titanium mesh cranioplasty. Preoperative imaging showed no evidence of intracranial abscess. This was discussed with the patient as well as risks, benefits, and alternatives and wished to proceed with surgery.   OPERATIVE DETAIL: The patient was taken to the operating room and placed on the OR table in the supine position. A formal time out was performed with two patient identifiers and confirmed the operative site. Anesthesia was induced by the anesthesia team. The operative site was marked, hair was clipped with surgical clippers, the area was then prepped and draped in a sterile fashion. His prior incision was opened and some mild purulence was seen. Cultures were obtained from the subgaleal space.   The screws were removed holding the bone flap in place and it was removed without difficulty. Cultures were taken from the epidural space. There was no evidence of epidural purulence. I removed all of the material out of the epidural space, the dura was not violated and appeared well healed. There was some bone that was soft and appeared to possibly have osteomyelitis near the anterior medial burr hole. This was debrided until health bone was encountered. The wound was copiously irrigated and any non-vascularized or devitalized material was removed. A piece of titanium mesh was then contoured to the skull,  placed, and secured with titanium screws.  All instrument and sponge counts were correct, the incision was then closed in layers. The patient was then returned to anesthesia for emergence. No apparent complications at the completion of the procedure.   EBL:  166mL   DRAINS: none   SPECIMENS: Subgaleal and epidural cultures   [name], MD 05/17/20 3:00 PM

## 2020-05-17 NOTE — Transfer of Care (Signed)
Immediate Anesthesia Transfer of Care Note  Patient: Gordon [name]  Procedure(s) Performed: CRANIAL WOUND EXPLORATION WITH PLACEMENT OF MESH (Left Head)  Patient Location: PACU  Anesthesia Type:General  Level of Consciousness: drowsy, patient cooperative and responds to stimulation  Airway & Oxygen Therapy: Patient Spontanous Breathing and Patient connected to nasal cannula oxygen  Post-op Assessment: Report given to RN and Post -op Vital signs reviewed and stable  Post vital signs: Reviewed and stable  Last Vitals:  Vitals Value Taken Time  BP 148/89 05/17/20 1709  Temp    Pulse 99 05/17/20 1708  Resp 18 05/17/20 1708  SpO2 100 % 05/17/20 1708  Vitals shown include unvalidated device data.  Last Pain:  Vitals:   05/17/20 0900  TempSrc: Oral  PainSc: 0-No pain         Complications: No complications documented.

## 2020-05-17 NOTE — Progress Notes (Signed)
Pt seen and evaluated, is having purulent drainage from the anterior aspect of his wound, I agree that this is concerning for recurrent wound infection. Will take to the OR today for washout, obtain cultures, remove the bone flap and perform a titanium mesh cranioplasty. Discussed w/ the patient and his family, who wish to proceed.

## 2020-05-17 NOTE — Brief Op Note (Signed)
05/17/2020  5:03 PM  PATIENT:  Jonelle Sidle Tonne  25 y.o. male  PRE-OPERATIVE DIAGNOSIS:  WOUND DRAINAGE  POST-OPERATIVE DIAGNOSIS:  WOUND DRAINAGE  PROCEDURE:  Procedure(s): CRANIAL WOUND EXPLORATION WITH PLACEMENT OF MESH (Left)  SURGEON:  Surgeon(s) and Role:    * [name], MD - Primary  PHYSICIAN ASSISTANT:   ASSISTANTS: none   ANESTHESIA:   general  EBL:  50cc  BLOOD ADMINISTERED:none  DRAINS: none   LOCAL MEDICATIONS USED:  LIDOCAINE   SPECIMEN:  Source of Specimen:  Subgaleal and epidural cultures  DISPOSITION OF SPECIMEN:  Microbiology  COUNTS:  YES  TOURNIQUET:  * No tourniquets in log *  DICTATION: .Note written in EPIC  PLAN OF CARE: Admit to inpatient   PATIENT DISPOSITION:  PACU - hemodynamically stable.   Delay start of Pharmacological VTE agent (>24hrs) due to surgical blood loss or risk of bleeding: yes

## 2020-05-17 NOTE — Anesthesia Preprocedure Evaluation (Addendum)
Anesthesia Evaluation  Patient identified by MRN, date of birth, ID band Patient awake    Reviewed: Allergy & Precautions, NPO status , Patient's Chart, lab work & pertinent test results  History of Anesthesia Complications (+) PONV  Airway Mallampati: II  TM Distance: >3 FB Neck ROM: Full    Dental  (+) Teeth Intact   Pulmonary neg pulmonary ROS,    Pulmonary exam normal        Cardiovascular negative cardio ROS   Rhythm:Regular Rate:Normal     Neuro/Psych Seizures - (03/22), Well Controlled,  S/p craniotomy for tumor resection 15/40 complicated by poor wound healing here for repeat exploration negative psych ROS   GI/Hepatic negative GI ROS, Neg liver ROS,   Endo/Other  negative endocrine ROS  Renal/GU negative Renal ROS  negative genitourinary   Musculoskeletal negative musculoskeletal ROS (+)   Abdominal (+)  Abdomen: soft. Bowel sounds: normal.  Peds  Hematology negative hematology ROS (+)   Anesthesia Other Findings   Reproductive/Obstetrics                             Anesthesia Physical Anesthesia Plan  ASA: III  Anesthesia Plan: General   Post-op Pain Management:    Induction: Intravenous  PONV Risk Score and Plan: 3 and Ondansetron, Dexamethasone, Midazolam and Treatment may vary due to age or medical condition  Airway Management Planned: Mask and Oral ETT  Additional Equipment: None  Intra-op Plan:   Post-operative Plan: Extubation in OR  Informed Consent: I have reviewed the patients History and Physical, chart, labs and discussed the procedure including the risks, benefits and alternatives for the proposed anesthesia with the patient or authorized representative who has indicated his/her understanding and acceptance.     Dental advisory given  Plan Discussed with: CRNA  Anesthesia Plan Comments: (Lab Results      Component                Value                Date                      WBC                      7.9                 05/15/2020                HGB                      14.3                05/15/2020                HCT                      43.0                05/15/2020                MCV                      87.4                05/15/2020                PLT  303                 05/15/2020           Lab Results      Component                Value               Date                      NA                       139                 05/15/2020                K                        4.2                 05/15/2020                CO2                      27                  05/15/2020                GLUCOSE                  98                  05/15/2020                BUN                      8                   05/15/2020                CREATININE               0.87                05/15/2020                CALCIUM                  9.9                 05/15/2020                GFRNONAA                 >60                 05/15/2020                GFRAA                                        08/10/2009            NOT CALCULATED        The eGFR has been calculated using the MDRD equation. This calculation has not been validated in all clinical situations. eGFR's persistently <60 mL/min signify possible  Chronic Kidney Disease.)        Anesthesia Quick Evaluation

## 2020-05-17 NOTE — Anesthesia Procedure Notes (Signed)
Procedure Name: Intubation Date/Time: 05/17/2020 3:52 PM Performed by: Josephine Igo, CRNA Pre-anesthesia Checklist: Patient identified, Suction available and Patient being monitored Patient Re-evaluated:Patient Re-evaluated prior to induction Oxygen Delivery Method: Circle system utilized Preoxygenation: Pre-oxygenation with 100% oxygen Induction Type: IV induction Ventilation: Mask ventilation without difficulty Laryngoscope Size: Miller and 2 Grade View: Grade I Tube type: Oral Tube size: 7.5 mm Number of attempts: 1 Airway Equipment and Method: Stylet Placement Confirmation: ETT inserted through vocal cords under direct vision,  positive ETCO2 and breath sounds checked- equal and bilateral Secured at: 23 cm Tube secured with: Tape Dental Injury: Teeth and Oropharynx as per pre-operative assessment

## 2020-05-18 ENCOUNTER — Ambulatory Visit: Payer: 59

## 2020-05-18 ENCOUNTER — Encounter (HOSPITAL_COMMUNITY): Payer: Self-pay | Admitting: Neurological Surgery

## 2020-05-18 ENCOUNTER — Telehealth: Payer: Self-pay

## 2020-05-18 ENCOUNTER — Other Ambulatory Visit (HOSPITAL_COMMUNITY): Payer: Self-pay

## 2020-05-18 DIAGNOSIS — T8149XA Infection following a procedure, other surgical site, initial encounter: Secondary | ICD-10-CM

## 2020-05-18 DIAGNOSIS — T847XXA Infection and inflammatory reaction due to other internal orthopedic prosthetic devices, implants and grafts, initial encounter: Secondary | ICD-10-CM

## 2020-05-18 DIAGNOSIS — T847XXD Infection and inflammatory reaction due to other internal orthopedic prosthetic devices, implants and grafts, subsequent encounter: Secondary | ICD-10-CM

## 2020-05-18 LAB — BASIC METABOLIC PANEL
Anion gap: 7 (ref 5–15)
BUN: 7 mg/dL (ref 6–20)
CO2: 26 mmol/L (ref 22–32)
Calcium: 9.6 mg/dL (ref 8.9–10.3)
Chloride: 105 mmol/L (ref 98–111)
Creatinine, Ser: 0.9 mg/dL (ref 0.61–1.24)
GFR, Estimated: >60 mL/min (ref >60)
Glucose, Bld: 121 mg/dL — ABNORMAL HIGH (ref 70–99)
Potassium: 3.8 mmol/L (ref 3.5–5.1)
Sodium: 138 mmol/L (ref 135–145)

## 2020-05-18 MED ORDER — MINOCYCLINE HCL 50 MG PO CAPS
200.0000 mg | ORAL_CAPSULE | Freq: Once | ORAL | Status: AC
  Administered 2020-05-18: 200 mg via ORAL
  Filled 2020-05-18: qty 4

## 2020-05-18 MED ORDER — RIFAMPIN 150 MG PO CAPS: 450.0000 mg | ORAL_CAPSULE | Freq: Two times a day (BID) | ORAL | 2 refills | Status: DC

## 2020-05-18 MED ORDER — MINOCYCLINE HCL 50 MG PO CAPS
200.0000 mg | ORAL_CAPSULE | Freq: Once | ORAL | Status: DC
  Filled 2020-05-18 (×4): qty 2

## 2020-05-18 MED ORDER — MINOCYCLINE HCL 100 MG PO CAPS
100.0000 mg | ORAL_CAPSULE | Freq: Two times a day (BID) | ORAL | Status: DC
  Filled 2020-05-18: qty 1

## 2020-05-18 MED ORDER — LEVETIRACETAM 750 MG PO TABS: 750.0000 mg | ORAL_TABLET | Freq: Two times a day (BID) | ORAL | 5 refills | Status: DC

## 2020-05-18 MED ORDER — MINOCYCLINE HCL 100 MG PO CAPS
100.0000 mg | ORAL_CAPSULE | Freq: Two times a day (BID) | ORAL | 2 refills | Status: DC
Start: 2020-05-18 — End: 2020-08-25

## 2020-05-18 MED ORDER — RIFAMPIN 300 MG PO CAPS
450.0000 mg | ORAL_CAPSULE | Freq: Two times a day (BID) | ORAL | Status: DC
  Filled 2020-05-18: qty 1

## 2020-05-18 MED ORDER — RIFAMPIN 300 MG PO CAPS: 300.0000 mg | ORAL_CAPSULE | Freq: Two times a day (BID) | ORAL | 2 refills | Status: AC

## 2020-05-18 MED ORDER — RIFAMPIN 300 MG PO CAPS
300.0000 mg | ORAL_CAPSULE | Freq: Two times a day (BID) | ORAL | Status: DC
  Administered 2020-05-18: 300 mg via ORAL
  Filled 2020-05-18 (×2): qty 1

## 2020-05-18 MED ORDER — RIFAMPIN 300 MG PO CAPS: 300.0000 mg | ORAL_CAPSULE | Freq: Two times a day (BID) | ORAL | 2 refills | Status: DC

## 2020-05-18 NOTE — Anesthesia Postprocedure Evaluation (Signed)
Anesthesia Post Note  Patient: Gordon [name]  Procedure(s) Performed: CRANIAL WOUND EXPLORATION WITH PLACEMENT OF MESH (Left Head)     Patient location during evaluation: PACU Anesthesia Type: General Level of consciousness: awake and alert Pain management: pain level controlled Vital Signs Assessment: post-procedure vital signs reviewed and stable Respiratory status: spontaneous breathing, nonlabored ventilation, respiratory function stable and patient connected to nasal cannula oxygen Cardiovascular status: blood pressure returned to baseline and stable Postop Assessment: no apparent nausea or vomiting Anesthetic complications: no   No complications documented.  Last Vitals:  Vitals:   05/18/20 0900 05/18/20 1000  BP: 128/67 128/72  Pulse: 91 83  Resp: 14 15  Temp:    SpO2: 99% 99%    Last Pain:  Vitals:   05/18/20 0800  TempSrc: Oral  PainSc: [place]

## 2020-05-18 NOTE — Discharge Summary (Signed)
Discharge Summary  Date of Admission: 05/15/2020  Date of Discharge: 05/18/20  Attending Physician: Emelda Brothers, MD  Hospital Course: Patient was admitted with recurrent cranial wound drainage. He was taken to the OR for a wound washout and removal of a bone flap with a titanium mesh cranioplasty. He was recovered in PACU and transferred to 4N. Cultures were NGTD at the time of discharge, ID was consulted and he was transitioned from vancomycin and cefepime to rifampin and minocycline. His hospital course was uncomplicated and the patient was discharged home on 05/18/20. He will follow up in clinic with me in 2 weeks.  Neurologic exam at discharge:  AOx3, PERRL, EOMI, FS, TM Strength 5/5 x4, SILTx4, no drift  Discharge diagnosis: Skull osteomyelitis   [name], MD 05/18/20 11:03 AM

## 2020-05-18 NOTE — Discharge Instructions (Addendum)
Discharge Instructions  No restriction in activities, slowly increase your activity back to normal.   Rifampin can turn bodily fluids orange.   Okay to shower on the day of discharge. Be gentle when cleaning your incision. Use regular soap and water. If that is uncomfortable, try using baby shampoo. Do not submerge the wound under water for 2 weeks after surgery.  Follow up with [name] in 2 weeks after discharge. If you do not already have a discharge appointment, please call his office at [phone] to schedule a follow up appointment. If you have any concerns or questions, please call the office and let us know.  Follow up with Dr. Gale Journey from infectious diseases on 5/11 at 13:45. His clinic address is: [facility] [address], [place], [place] [address] Phone: [phone]

## 2020-05-18 NOTE — Progress Notes (Signed)
[facility] for Infectious Disease  Date of Admission:  05/15/2020     CC: Cranial OM; hardware complicating wound infection  Lines: Peripheral iv's  Abx: 4/24-c vanc/cefepime  ASSESSMENT: S/p I&D and all old hardware removal with titanium mesh placement 4/24. Previous 03/2020 initial I&D culture mssa (S tetracycline)/p-acnes  PLAN: Stop cefepime/vanc Start minocycline 100 mg po bid and rifampin 450 mg po bid; plan for 3 months with this regimen After 3 months (to be determined in clinic) will transition to doxycycline indefinite suppression therapy crp today; will repeat in id clinic f/u Discussed with primary team and patient's mother    Clinic Follow Up Appt: 5/11 @ 145 with Dr Gale Journey  @  [facility] [address], [place], [place] [address] Phone: [phone]  I spent more than 35 minute reviewing data/chart, and coordinating care and >50% direct face to face time providing counseling/discussing diagnostics/treatment plan with patient    Principal Problem:   Wound infection after surgery Active Problems:   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[place])   Wound infection   No Known Allergies  Scheduled Meds:  Chlorhexidine Gluconate Cloth  6 each Topical Daily   levETIRAcetam  750 mg Oral BID   sodium chloride flush  3 mL Intravenous Q12H   Continuous Infusions:  sodium chloride 0 mL (05/16/20 1630)   ceFEPime (MAXIPIME) IV Stopped (05/18/20 2025)   vancomycin 1,250 mg (05/18/20 0821)   PRN Meds:.sodium chloride, HYDROcodone-acetaminophen, morphine injection, ondansetron **OR** ondansetron (ZOFRAN) IV, senna-docusate, sodium chloride flush   SUBJECTIVE: Patient doing well post-op No headache, n/v, focal weakness, confusion Eating well Cultures in progress Pending disposition  Potentially going to [facility] for further management in near future  Review of Systems: ROS All other ROS was negative, except mentioned above     OBJECTIVE: Vitals:    05/18/20 0550 05/18/20 0700 05/18/20 0800 05/18/20 0900  BP:  117/68 129/74 128/67  Pulse: 88 77 98 91  Resp: 14 17 (!) 22 14  Temp:   98 F (36.7 C)   TempSrc:   Oral   SpO2: 98% 94% 96% 99%  Weight:      Height:       Body mass index is 25.46 kg/m.  Physical Exam General/constitutional: no distress, pleasant HEENT: Normocephalic, PER, Conj Clear, EOMI, Oropharynx clear; incision on scalp intact (staples in placed) Neck supple CV: rrr no mrg Lungs: clear to auscultation, normal respiratory effort Abd: Soft, Nontender Ext: no edema Skin: No Rash Neuro: nonfocal MSK: no peripheral joint swelling/tenderness/warmth; back spines nontender     Lab Results Lab Results  Component Value Date   WBC 7.9 05/15/2020   HGB 14.3 05/15/2020   HCT 43.0 05/15/2020   MCV 87.4 05/15/2020   PLT 303 05/15/2020    Lab Results  Component Value Date   CREATININE 0.87 05/15/2020   BUN 8 05/15/2020   NA 139 05/15/2020   K 4.2 05/15/2020   CL 102 05/15/2020   CO2 27 05/15/2020    Lab Results  Component Value Date   ALT 22 04/19/2020   AST 18 04/19/2020   ALKPHOS 44 04/19/2020   BILITOT 0.7 04/19/2020      Microbiology: Recent Results (from the past 240 hour(s))  Resp Panel by RT-PCR (Flu A&B, Covid) Nasopharyngeal Swab     Status: None   Collection Time: 05/15/20  5:23 PM   Specimen: Nasopharyngeal Swab; Nasopharyngeal(NP) swabs in vial transport medium  Result Value Ref Range  Status   SARS Coronavirus 2 by RT PCR NEGATIVE NEGATIVE Final    Comment: (NOTE) SARS-CoV-2 target nucleic acids are NOT DETECTED.  The SARS-CoV-2 RNA is generally detectable in upper respiratory specimens during the acute phase of infection. The lowest concentration of SARS-CoV-2 viral copies this assay can detect is 138 copies/mL. A negative result does not preclude SARS-Cov-2 infection and should not be used as the sole basis for treatment or other patient management decisions. A negative result  may occur with  improper specimen collection/handling, submission of specimen other than nasopharyngeal swab, presence of viral mutation(s) within the areas targeted by this assay, and inadequate number of viral copies(<138 copies/mL). A negative result must be combined with clinical observations, patient history, and epidemiological information. The expected result is Negative.  Fact Sheet for Patients:  EntrepreneurPulse.com.au  Fact Sheet for Healthcare Providers:  IncredibleEmployment.be  This test is no t yet approved or cleared by the [place] FDA and  has been authorized for detection and/or diagnosis of SARS-CoV-2 by FDA under an Emergency Use Authorization (EUA). This EUA will remain  in effect (meaning this test can be used) for the duration of the COVID-19 declaration under Section 564(b)(1) of the Act, 21 U.S.C.section 360bbb-3(b)(1), unless the authorization is terminated  or revoked sooner.       Influenza A by PCR NEGATIVE NEGATIVE Final   Influenza B by PCR NEGATIVE NEGATIVE Final    Comment: (NOTE) The Xpert Xpress SARS-CoV-2/FLU/RSV plus assay is intended as an aid in the diagnosis of influenza from Nasopharyngeal swab specimens and should not be used as a sole basis for treatment. Nasal washings and aspirates are unacceptable for Xpert Xpress SARS-CoV-2/FLU/RSV testing.  Fact Sheet for Patients: EntrepreneurPulse.com.au  Fact Sheet for Healthcare Providers: IncredibleEmployment.be  This test is not yet approved or cleared by the [place] FDA and has been authorized for detection and/or diagnosis of SARS-CoV-2 by FDA under an Emergency Use Authorization (EUA). This EUA will remain in effect (meaning this test can be used) for the duration of the COVID-19 declaration under Section 564(b)(1) of the Act, 21 U.S.C. section 360bbb-3(b)(1), unless the authorization is terminated  or revoked.  Performed at [facility], [place] [address], [place], [place] [address]   Surgical pcr screen     Status: None   Collection Time: 05/17/20  5:25 AM   Specimen: Nasal Mucosa; Nasal Swab  Result Value Ref Range Status   MRSA, PCR NEGATIVE NEGATIVE Final   Staphylococcus aureus NEGATIVE NEGATIVE Final    Comment: (NOTE) The Xpert SA Assay (FDA approved for NASAL specimens in patients 71 years of age and older), is one component of a comprehensive surveillance program. It is not intended to diagnose infection nor to guide or monitor treatment. Performed at [facility], [place] [address], [place], [place] [address]   Aerobic/Anaerobic Culture w Gram Stain (surgical/deep wound)     Status: None (Preliminary result)   Collection Time: 05/17/20  4:15 PM   Specimen: Wound  Result Value Ref Range Status   Specimen Description WOUND  Final   Special Requests SUBGALEAL ID A  Final   Gram Stain   Final    RARE WBC PRESENT, PREDOMINANTLY MONONUCLEAR NO ORGANISMS SEEN Performed at [facility], [place] [address] [place], [place] [address]    Culture PENDING  Incomplete   Report Status PENDING  Incomplete  Aerobic/Anaerobic Culture w Gram Stain (surgical/deep wound)     Status: None (Preliminary result)  Collection Time: 05/17/20  4:15 PM   Specimen: Wound  Result Value Ref Range Status   Specimen Description WOUND  Final   Special Requests EPIDURAL ID B  Final   Gram Stain   Final    NO WBC SEEN NO ORGANISMS SEEN Performed at [facility], [place] [address], [place], [place] [address]    Culture PENDING  Incomplete   Report Status PENDING  Incomplete     Serology:   Imaging: If present, new imagings (plain films, ct scans, and mri) have been personally visualized and interpreted; radiology reports have been reviewed. Decision making incorporated into the Impression / Recommendations.   Jabier Mutton, [facility] for Infect[facility] [phone] pager    05/18/2020, 9:48 AM

## 2020-05-18 NOTE — Telephone Encounter (Signed)
Page sent to Dr. Gale Journey and discussed medication issues. Rx has been updated in epic and family member made aware.  Gordon [name]

## 2020-05-18 NOTE — Progress Notes (Signed)
All questions answered with patient and mother.

## 2020-05-19 ENCOUNTER — Ambulatory Visit: Payer: 59

## 2020-05-20 ENCOUNTER — Ambulatory Visit: Payer: 59

## 2020-05-21 ENCOUNTER — Ambulatory Visit: Payer: 59

## 2020-05-23 LAB — AEROBIC/ANAEROBIC CULTURE W GRAM STAIN (SURGICAL/DEEP WOUND): Gram Stain: NONE SEEN

## 2020-05-24 ENCOUNTER — Ambulatory Visit: Payer: 59

## 2020-05-25 ENCOUNTER — Ambulatory Visit: Payer: 59

## 2020-05-26 ENCOUNTER — Ambulatory Visit: Payer: 59

## 2020-05-27 ENCOUNTER — Inpatient Hospital Stay: Payer: 59

## 2020-05-27 ENCOUNTER — Inpatient Hospital Stay: Payer: 59 | Admitting: Internal Medicine

## 2020-05-27 ENCOUNTER — Ambulatory Visit: Payer: 59

## 2020-05-28 ENCOUNTER — Ambulatory Visit: Payer: 59

## 2020-05-31 ENCOUNTER — Ambulatory Visit: Payer: 59 | Admitting: Radiation Oncology

## 2020-06-01 ENCOUNTER — Telehealth: Payer: Self-pay | Admitting: Radiation Oncology

## 2020-06-01 ENCOUNTER — Ambulatory Visit: Payer: 59

## 2020-06-01 ENCOUNTER — Encounter: Payer: Self-pay | Admitting: Internal Medicine

## 2020-06-01 ENCOUNTER — Encounter: Payer: Self-pay | Admitting: Radiation Oncology

## 2020-06-01 NOTE — Telephone Encounter (Signed)
We found out that the patient has been seen at [facility] yesterday by neuro oncology, but that he has not seen [name] for postop clearance to proceed with radiation. I called her to discuss that until he's cleared, Dr. Lisbeth Renshaw would recommend holding off on starting treatment. She is understandably upset and called in to our staff at the initial direction of myself moving his appointment cussing with the staff. I explained the importance of ensuring he's well healed as radiation could lead to chronic would and poor healing if it's started too soon or if the skin is not fully approximated. I messaged [name], but his schedule may not allow for a work in visit sooner than his appt on Thursday, so for now we will leave his appt for treatment to start on Monday 06/07/20 unless there were a change in this visit. I sent a mychart message with my direct number so his mother can update me on any changes.

## 2020-06-01 NOTE — Telephone Encounter (Signed)
The patient's mother called me back. She's still dissatisfied about her son's course and is trying to coordinate to see if he can be seen sooner with [name]. I did get a message later on that she had spoken with [name] and from him personally that they had conversed, and agreed to start treatment this coming Monday. I left another message at the end of the day with the patient's mother that I had seen her message in [place] and received her voicemail and that we would keep our plans for treatment on Monday and to let us know if something else changes with his treatment plans.

## 2020-06-02 ENCOUNTER — Ambulatory Visit (INDEPENDENT_AMBULATORY_CARE_PROVIDER_SITE_OTHER): Payer: 59 | Admitting: Internal Medicine

## 2020-06-02 ENCOUNTER — Ambulatory Visit: Payer: 59 | Admitting: Radiation Oncology

## 2020-06-02 ENCOUNTER — Other Ambulatory Visit: Payer: Self-pay

## 2020-06-02 ENCOUNTER — Encounter: Payer: Self-pay | Admitting: Internal Medicine

## 2020-06-02 VITALS — BP 116/67 | HR 77 | Temp 98.3°F | Ht 75.0 in | Wt 204.0 lb

## 2020-06-02 DIAGNOSIS — C719 Malignant neoplasm of brain, unspecified: Secondary | ICD-10-CM | POA: Diagnosis not present

## 2020-06-02 DIAGNOSIS — T847XXA Infection and inflammatory reaction due to other internal orthopedic prosthetic devices, implants and grafts, initial encounter: Secondary | ICD-10-CM

## 2020-06-02 DIAGNOSIS — M869 Osteomyelitis, unspecified: Secondary | ICD-10-CM | POA: Diagnosis not present

## 2020-06-02 DIAGNOSIS — T847XXD Infection and inflammatory reaction due to other internal orthopedic prosthetic devices, implants and grafts, subsequent encounter: Secondary | ICD-10-CM

## 2020-06-02 DIAGNOSIS — T8149XA Infection following a procedure, other surgical site, initial encounter: Secondary | ICD-10-CM | POA: Diagnosis not present

## 2020-06-02 NOTE — Patient Instructions (Signed)
You are doing well    Continue to finish 3 months rifampin and minocycline  After the 3 months, will switch you to just doxycycline 100 mg twice daily indefinitely   From our standpoint, it is ok to start chemo soon if needed   Follow up with me in 6-8 weeks  Will do blood test today to check inflammatory markers and assess for antitbiotics toxicity

## 2020-06-02 NOTE — Progress Notes (Signed)
[facility] for Infectious Disease  Patient Active Problem List   Diagnosis Date Noted  . Hardware complicating wound infection ([place])   . Wound infection 05/16/2020  . Wound infection after surgery 04/19/2020  . 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 Brain mass 04/02/2020  . Seizure ([place]) 04/02/2020  . Left knee injury, subsequent encounter 06/13/2017  . Low back pain 03/25/2014      Subjective:    Patient ID: Gordon [name], male    DOB: 05-13-1995, 25 y.o.   MRN: [id number]  Chief Complaint  Patient presents with  . Follow-up    HPI:  Gordon [name] is a 25 y.o. 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 cx the second time MRSE and mssa but no p. Acnes  He is doing well on mino/rifampin No seizure. Just taking keppra for seizure  Chemo tx of tumor planned soon once infection controlled and wound healed.  No n/v/diarrhea, rash, joint pain Eating well  No Known Allergies    Outpatient Medications Prior to Visit  Medication Sig Dispense Refill  . levETIRAcetam (KEPPRA) 750 MG tablet Take 1 tablet (750 mg total) by mouth 2 (two) times daily. 60 tablet 5  . minocycline (MINOCIN) 100 MG capsule Take 1 capsule (100 mg total) by mouth 2 (two) times daily. 60 capsule 2  . Multiple Vitamin (MULTIVITAMIN PO) Take 1 capsule by mouth daily.    . Omega-3 Fatty Acids (OMEGA 3 PO) Take 1 capsule by mouth daily.    . Probiotic Product (PROBIOTIC PO) Take 1 capsule by mouth daily.    . rifampin (RIFADIN) 300 MG capsule Take 1 capsule (300 mg total) by mouth 2 (two) times daily. 60 capsule 2   No facility-administered medications prior to visit.     Social History   Socioeconomic History  . Marital status: Single    Spouse name: Not on file  . Number of children: 0   . Years of education: Not on file  . Highest education level: Not on file  Occupational History  . Not on file  Tobacco Use  . Smoking status: Never Smoker  . Smokeless tobacco: Never Used  Vaping Use  . Vaping Use: Never used  Substance and Sexual Activity  . Alcohol use: No    Alcohol/week: 0.0 standard drinks  . Drug use: No  . Sexual activity: Yes    Birth control/protection: Condom  Other Topics Concern  . Not on file  Social History Narrative  . Not on file   Social Determinants of Health   Financial Resource Strain: Not on file  Food Insecurity: Not on file  Transportation Needs: Not on file  Physical Activity: Not on file  Stress: Not on file  Social Connections: Not on file  Intimate Partner Violence: Not on file      Review of Systems    All other ros negative  Objective:    BP 116/67   Pulse 77   Temp 98.3 F (36.8 C) (Oral)   Ht 6' 3"  (1.905 m)   Wt 204 lb (92.5 kg)   SpO2 100%   BMI 25.50 kg/m  Nursing note and vital signs reviewed.  Physical Exam     General/constitutional: no distress, pleasant HEENT: Normocephalic, PER, Conj Clear, EOMI, Oropharynx  clear Neck supple CV: rrr no mrg Lungs: clear to auscultation, normal respiratory effort Abd: Soft, Nontender Ext: no edema Skin: No Rash -- incision on scalped healed -- sutures remain (to be removed tomorrow 5/12) Neuro: nonfocal MSK: no peripheral joint swelling/tenderness/warmth; back spines nontender     Labs: Lab Results  Component Value Date   WBC 7.9 05/15/2020   HGB 14.3 05/15/2020   HCT 43.0 05/15/2020   MCV 87.4 05/15/2020   PLT 303 61/90/1222   Last metabolic panel Lab Results  Component Value Date   GLUCOSE 121 (H) 05/18/2020   NA 138 05/18/2020   K 3.8 05/18/2020   CL 105 05/18/2020   CO2 26 05/18/2020   BUN 7 05/18/2020   CREATININE 0.90 05/18/2020   GFRNONAA >60 05/18/2020   GFRAA  08/10/2009    NOT CALCULATED        The eGFR has been  calculated using the MDRD equation. This calculation has not been validated in all clinical situations. eGFR's persistently <60 mL/min signify possible Chronic Kidney Disease.   CALCIUM 9.6 05/18/2020   PROT 7.8 04/19/2020   ALBUMIN 3.9 04/19/2020   BILITOT 0.7 04/19/2020   ALKPHOS 44 04/19/2020   AST 18 04/19/2020   ALT 22 04/19/2020   ANIONGAP 7 05/18/2020    Micro:  Serology:  Imaging:  Assessment & Plan:   Problem List Items Addressed This Visit      Other   Hardware complicating wound infection ([place])   Relevant Orders   CBC w/Diff   C-reactive protein   Comp Met (CMET)    Other Visit Diagnoses    Surgical site infection    -  Primary   Relevant Orders   CBC w/Diff   C-reactive protein   Comp Met (CMET)   Osteomyelitis, unspecified site, unspecified type ([place])       Relevant Orders   CBC w/Diff   C-reactive protein   Comp Met (CMET)     Abx: 4/26-c mino/rifampin (300 rif bid)  Doing well No seizure from previous tumor Surgical site healed Tolerating antibiotics  -continue current abx for total 3 months from 05/18/20 -ok to do chemo if needed -f/u nsg/onc as planned -f/u with me around 6 weeks -labs today as indicated above    No orders of the defined types were placed in this encounter.    I spent more than 20 minute reviewing data/chart, and coordinating care and >50% direct face to face time providing counseling/discussing diagnostics/treatment plan with patient    Follow-up: Return in about 6 weeks (around 07/14/2020).      Gordon [name], [facility] for [facility] [phone]  pager   [phone] cell 06/02/2020, 1:59 PM

## 2020-06-03 ENCOUNTER — Ambulatory Visit: Payer: 59

## 2020-06-03 ENCOUNTER — Other Ambulatory Visit (HOSPITAL_COMMUNITY): Payer: Self-pay

## 2020-06-03 LAB — CBC WITH DIFFERENTIAL/PLATELET
Absolute Monocytes: 393 cells/uL (ref 200–950)
Basophils Absolute: 80 cells/uL (ref 0–200)
Basophils Relative: 1.4 %
Eosinophils Absolute: 143 cells/uL (ref 15–500)
Eosinophils Relative: 2.5 %
HCT: 40.8 % (ref 38.5–50.0)
Hemoglobin: 13.8 g/dL (ref 13.2–17.1)
Lymphs Abs: 2052 cells/uL (ref 850–3900)
MCH: 29.2 pg (ref 27.0–33.0)
MCHC: 33.8 g/dL (ref 32.0–36.0)
MCV: 86.3 fL (ref 80.0–100.0)
MPV: 10.2 fL (ref 7.5–12.5)
Monocytes Relative: 6.9 %
Neutro Abs: 3032 cells/uL (ref 1500–7800)
Neutrophils Relative %: 53.2 %
Platelets: 310 10*3/uL (ref 140–400)
RBC: 4.73 10*6/uL (ref 4.20–5.80)
RDW: 12.9 % (ref 11.0–15.0)
Total Lymphocyte: 36 %
WBC: 5.7 10*3/uL (ref 3.8–10.8)

## 2020-06-03 LAB — COMPREHENSIVE METABOLIC PANEL
AG Ratio: 1.7 (calc) (ref 1.0–2.5)
ALT: 18 U/L (ref 9–46)
AST: 16 U/L (ref 10–40)
Albumin: 4.3 g/dL (ref 3.6–5.1)
Alkaline phosphatase (APISO): 79 U/L (ref 36–130)
BUN: 13 mg/dL (ref 7–25)
CO2: 29 mmol/L (ref 20–32)
Calcium: 9.4 mg/dL (ref 8.6–10.3)
Chloride: 104 mmol/L (ref 98–110)
Creat: 0.87 mg/dL (ref 0.60–1.35)
Globulin: 2.6 g/dL (calc) (ref 1.9–3.7)
Glucose, Bld: 83 mg/dL (ref 65–99)
Potassium: 4.3 mmol/L (ref 3.5–5.3)
Sodium: 140 mmol/L (ref 135–146)
Total Bilirubin: 0.3 mg/dL (ref 0.2–1.2)
Total Protein: 6.9 g/dL (ref 6.1–8.1)

## 2020-06-03 LAB — C-REACTIVE PROTEIN: CRP: 1.2 mg/L (ref <8.0)

## 2020-06-04 ENCOUNTER — Ambulatory Visit: Payer: 59

## 2020-06-07 ENCOUNTER — Ambulatory Visit
Admission: RE | Admit: 2020-06-07 | Discharge: 2020-06-07 | Disposition: A | Discharge status: Home / Self Care | Payer: 59 | Source: Ambulatory Visit | Attending: Radiation Oncology | Admitting: Radiation Oncology

## 2020-06-07 DIAGNOSIS — C719 Malignant neoplasm of brain, unspecified: Secondary | ICD-10-CM | POA: Insufficient documentation

## 2020-06-08 ENCOUNTER — Other Ambulatory Visit: Payer: Self-pay

## 2020-06-08 ENCOUNTER — Ambulatory Visit
Admission: RE | Admit: 2020-06-08 | Discharge: 2020-06-08 | Disposition: A | Discharge status: Home / Self Care | Payer: 59 | Source: Ambulatory Visit | Attending: Radiation Oncology | Admitting: Radiation Oncology

## 2020-06-08 ENCOUNTER — Inpatient Hospital Stay: Payer: 59 | Admitting: Internal Medicine

## 2020-06-08 DIAGNOSIS — C719 Malignant neoplasm of brain, unspecified: Secondary | ICD-10-CM | POA: Diagnosis not present

## 2020-06-09 ENCOUNTER — Ambulatory Visit
Admission: RE | Admit: 2020-06-09 | Discharge: 2020-06-09 | Disposition: A | Discharge status: Home / Self Care | Payer: 59 | Source: Ambulatory Visit | Attending: Radiation Oncology | Admitting: Radiation Oncology

## 2020-06-09 DIAGNOSIS — C719 Malignant neoplasm of brain, unspecified: Secondary | ICD-10-CM | POA: Diagnosis not present

## 2020-06-10 ENCOUNTER — Other Ambulatory Visit: Payer: Self-pay

## 2020-06-10 ENCOUNTER — Other Ambulatory Visit: Payer: 59

## 2020-06-10 ENCOUNTER — Ambulatory Visit: Payer: 59 | Admitting: Internal Medicine

## 2020-06-10 ENCOUNTER — Ambulatory Visit
Admission: RE | Admit: 2020-06-10 | Discharge: 2020-06-10 | Disposition: A | Discharge status: Home / Self Care | Payer: 59 | Source: Ambulatory Visit | Attending: Radiation Oncology | Admitting: Radiation Oncology

## 2020-06-10 DIAGNOSIS — C719 Malignant neoplasm of brain, unspecified: Secondary | ICD-10-CM | POA: Diagnosis not present

## 2020-06-10 NOTE — Progress Notes (Addendum)
Pt here for patient teaching.  Pt given Radiation and You booklet, skin care instructions and Sonafine.  Reviewed areas of pertinence such as fatigue, hair loss, nausea and vomiting, skin changes, headache and blurry vision . Pt able to give teach back of to pat skin and use unscented/gentle soap,apply Sonafine bid and avoid applying anything to skin within 4 hours of treatment. Pt verbalizes understanding of information given and will contact nursing with any questions or concerns.  Patients mother was present for teaching.   Http://rtanswers.org/treatmentinformation/whattoexpect/index   Gloriajean Dell. Leonie Green, BSN

## 2020-06-11 ENCOUNTER — Ambulatory Visit
Admission: RE | Admit: 2020-06-11 | Discharge: 2020-06-11 | Disposition: A | Discharge status: Home / Self Care | Payer: 59 | Source: Ambulatory Visit | Attending: Radiation Oncology | Admitting: Radiation Oncology

## 2020-06-11 DIAGNOSIS — C719 Malignant neoplasm of brain, unspecified: Secondary | ICD-10-CM | POA: Diagnosis not present

## 2020-06-11 MED ORDER — SONAFINE EX EMUL
1.0000 | Freq: Once | CUTANEOUS | Status: AC
Start: 2020-06-11 — End: 2020-06-11
  Administered 2020-06-11: 1 via TOPICAL

## 2020-06-14 ENCOUNTER — Ambulatory Visit
Admission: RE | Admit: 2020-06-14 | Discharge: 2020-06-14 | Disposition: A | Discharge status: Home / Self Care | Payer: 59 | Source: Ambulatory Visit | Attending: Radiation Oncology | Admitting: Radiation Oncology

## 2020-06-14 ENCOUNTER — Other Ambulatory Visit: Payer: Self-pay

## 2020-06-14 DIAGNOSIS — C719 Malignant neoplasm of brain, unspecified: Secondary | ICD-10-CM | POA: Diagnosis not present

## 2020-06-15 ENCOUNTER — Ambulatory Visit
Admission: RE | Admit: 2020-06-15 | Discharge: 2020-06-15 | Disposition: A | Discharge status: Home / Self Care | Payer: 59 | Source: Ambulatory Visit | Attending: Radiation Oncology | Admitting: Radiation Oncology

## 2020-06-15 ENCOUNTER — Inpatient Hospital Stay: Payer: 59

## 2020-06-15 ENCOUNTER — Ambulatory Visit: Payer: 59 | Admitting: Internal Medicine

## 2020-06-15 ENCOUNTER — Inpatient Hospital Stay: Payer: 59 | Attending: Internal Medicine | Admitting: Internal Medicine

## 2020-06-15 VITALS — BP 122/69 | HR 70 | Temp 98.2°F | Resp 18 | Wt 204.0 lb

## 2020-06-15 DIAGNOSIS — R569 Unspecified convulsions: Secondary | ICD-10-CM

## 2020-06-15 DIAGNOSIS — Z79899 Other long term (current) drug therapy: Secondary | ICD-10-CM | POA: Diagnosis not present

## 2020-06-15 DIAGNOSIS — C711 Malignant neoplasm of frontal lobe: Secondary | ICD-10-CM | POA: Insufficient documentation

## 2020-06-15 DIAGNOSIS — C719 Malignant neoplasm of brain, unspecified: Secondary | ICD-10-CM

## 2020-06-15 LAB — CBC WITH DIFFERENTIAL (CANCER CENTER ONLY)
Abs Immature Granulocytes: 0.01 10*3/uL (ref 0.00–0.07)
Basophils Absolute: 0.1 10*3/uL (ref 0.0–0.1)
Basophils Relative: 1 %
Eosinophils Absolute: 0.2 10*3/uL (ref 0.0–0.5)
Eosinophils Relative: 4 %
HCT: 41.4 % (ref 39.0–52.0)
Hemoglobin: 14.2 g/dL (ref 13.0–17.0)
Immature Granulocytes: 0 %
Lymphocytes Relative: 34 %
Lymphs Abs: 1.6 10*3/uL (ref 0.7–4.0)
MCH: 29.8 pg (ref 26.0–34.0)
MCHC: 34.3 g/dL (ref 30.0–36.0)
MCV: 86.8 fL (ref 80.0–100.0)
Monocytes Absolute: 0.5 10*3/uL (ref 0.1–1.0)
Monocytes Relative: 10 %
Neutro Abs: 2.4 10*3/uL (ref 1.7–7.7)
Neutrophils Relative %: 51 %
Platelet Count: 212 10*3/uL (ref 150–400)
RBC: 4.77 MIL/uL (ref 4.22–5.81)
RDW: 13.2 % (ref 11.5–15.5)
WBC Count: 4.8 10*3/uL (ref 4.0–10.5)
nRBC: 0 % (ref 0.0–0.2)

## 2020-06-15 LAB — CMP (CANCER CENTER ONLY)
ALT: 24 U/L (ref 0–44)
AST: 19 U/L (ref 15–41)
Albumin: 4.3 g/dL (ref 3.5–5.0)
Alkaline Phosphatase: 77 U/L (ref 38–126)
Anion gap: 8 (ref 5–15)
BUN: 13 mg/dL (ref 6–20)
CO2: 28 mmol/L (ref 22–32)
Calcium: 9.8 mg/dL (ref 8.9–10.3)
Chloride: 105 mmol/L (ref 98–111)
Creatinine: 0.86 mg/dL (ref 0.61–1.24)
GFR, Estimated: >60 mL/min (ref >60)
Glucose, Bld: 82 mg/dL (ref 70–99)
Potassium: 4.5 mmol/L (ref 3.5–5.1)
Sodium: 141 mmol/L (ref 135–145)
Total Bilirubin: 0.3 mg/dL (ref 0.3–1.2)
Total Protein: 7.5 g/dL (ref 6.5–8.1)

## 2020-06-15 NOTE — Progress Notes (Signed)
Gordon [name], [place] [id number] [phone]   Interval Evaluation  Date of Service: 06/15/20 Patient Name: Gordon [name] Patient MRN: [id number] Patient DOB: [birth year] Provider: Ventura Sellers, MD  Identifying Statement:  Gordon [name] is a 25 y.o. 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   06/07/2020 -  Radiation Therapy   IMRT and concurrent Temodar 70m/m2     Biomarkers:  MGMT Unknown.  IDH 1/2 Mutated.  EGFR Unknown  TERT Unknown   Interval History:  Gordon [name] to clinic for follow up today, now having completed 2 weeks of radiation and Temozolomide.  Tolerating treatment well thus far.  Continues on antibiotics per ID schedule (2 months remaining).  He is now a cForensic [name]  Staying active but not driving at this time.  Does complain of mood and depression symptoms.  H+P (04/12/20) Patient presented to medical attention three weeks prior with episode of loss of awareness and generalized shaking consistent with seizure episode.  CNS imaging demonstrated an enhancing left frontal mass, which was resected on 04/04/20 by [name]  He tolerated surgery well, has no significant complaints at this time.  Last day of decadron taper is today.  He has been more or less independent at home, starting to resume some of his studies at school.  Currently a business student at [employer]  No other significant medical history.  Medications: Current Outpatient Medications on File Prior to Visit  Medication Sig Dispense Refill  . levETIRAcetam (KEPPRA) 750 MG tablet Take  1 tablet (750 mg total) by mouth 2 (two) times daily. 60 tablet 5  . minocycline (MINOCIN) 100 MG capsule Take 1 capsule (100 mg total) by mouth 2 (two) times daily. 60 capsule 2  . Multiple Vitamin (MULTIVITAMIN PO) Take 1 capsule by mouth daily.    . Omega-3 Fatty Acids (OMEGA 3 PO) Take 1 capsule by mouth daily.    . Probiotic Product (PROBIOTIC PO) Take 1 capsule by mouth daily.    . rifampin (RIFADIN) 300 MG capsule Take 1 capsule (300 mg total) by mouth 2 (two) times daily. 60 capsule 2   No current facility-administered medications on file prior to visit.    Allergies: No Known Allergies Past Medical History:  Past Medical History:  Diagnosis Date  . Bulging lumbar disc   . Complication of anesthesia   . PONV (postoperative nausea and vomiting)    Vomited x1 after craniotomy surgery  . [place]izure ([place] 04/03/2020   x 1 at home pre-admission.  Otherwise, no history.   Past Surgical History:  Past Surgical History:  Procedure Laterality Date  . BURR HOLE Right 04/20/2020   Procedure: Irrigation and Debridement of Cranial Wound;  Surgeon: OJudith Part MD;  [facility]  Service: Neurosurgery;  Laterality: Right;  . CRANIOTOMY Left 04/04/2020   Procedure: LEFT CRANIOTOMY FOR TUMOR EXCISION;  Surgeon: OJudith Part MD;  [facility]  Service: Neurosurgery;  Laterality: Left;  . TONSILLECTOMY    . WISDOM TOOTH EXTRACTION    . WOUND EXPLORATION Left 05/17/2020   Procedure: CRANIAL WOUND EXPLORATION WITH  PLACEMENT OF MESH;  Surgeon: [name], MD;  Location: [facility];  Service: Neurosurgery;  Laterality: Left;   Social History:  Social History   Socioeconomic History  . Marital status: Single    Spouse name: Not on file  . Number of children: 0  . Years of education: Not on file  . Highest education level: Not on file  Occupational History  . Not on file  Tobacco Use  . Smoking status: Never Smoker  . Smokeless tobacco: Never Used  Vaping Use  .  Vaping Use: Never used  Substance and Sexual Activity  . Alcohol use: No    Alcohol/week: 0.0 standard drinks  . Drug use: No  . Sexual activity: Yes    Birth control/protection: Condom  Other Topics Concern  . Not on file  Social History Narrative  . Not on file   Social Determinants of Health   Financial Resource Strain: Not on file  Food Insecurity: Not on file  Transportation Needs: Not on file  Physical Activity: Not on file  Stress: Not on file  Social Connections: Not on file  Intimate Partner Violence: Not on file   Family History: No family history on file.  Review of Systems: Constitutional: Doesn't report fevers, chills or abnormal weight loss Eyes: Doesn't report blurriness of vision Ears, nose, mouth, throat, and face: Doesn't report sore throat Respiratory: Doesn't report cough, dyspnea or wheezes Cardiovascular: Doesn't report palpitation, chest discomfort  Gastrointestinal:  Doesn't report nausea, constipation, diarrhea GU: Doesn't report incontinence Skin: Doesn't report skin rashes Neurological: Per HPI Musculoskeletal: Doesn't report joint pain Behavioral/Psych: Doesn't report anxiety  Physical Exam: Vitals:   06/15/20 1003  BP: 122/69  Pulse: 70  Resp: 18  Temp: 98.2 F (36.8 C)  SpO2: 100%   KPS: 90. General: Alert, cooperative, pleasant, in no acute distress Head: Normal EENT: No conjunctival injection or scleral icterus.  Lungs: Resp effort normal Cardiac: Regular rate Abdomen: Non-distended abdomen Skin: No rashes cyanosis or petechiae. Extremities: No clubbing or edema  Neurologic Exam: Mental Status: Awake, alert, attentive to examiner. Oriented to self and environment. Language is fluent with intact comprehension.  Cranial Nerves: Visual acuity is grossly normal. Visual fields are full. Extra-ocular movements intact. No ptosis. Face is symmetric Motor: Tone and bulk are normal. Power is full in both arms and legs. Reflexes are  symmetric, no pathologic reflexes present.  Sensory: Intact to light touch Gait: Normal.   Labs: I have reviewed the data as listed    Component Value Date/Time   NA 140 06/02/2020 1416   K 4.3 06/02/2020 1416   CL 104 06/02/2020 1416   CO2 29 06/02/2020 1416   GLUCOSE 83 06/02/2020 1416   BUN 13 06/02/2020 1416   CREATININE 0.87 06/02/2020 1416   CALCIUM 9.4 06/02/2020 1416   PROT 6.9 06/02/2020 1416   ALBUMIN 3.9 04/19/2020 1023   AST 16 06/02/2020 1416   ALT 18 06/02/2020 1416   ALKPHOS 44 04/19/2020 1023   BILITOT 0.3 06/02/2020 1416   GFRNONAA >60 05/18/2020 1003   GFRAA  08/10/2009 2150    NOT CALCULATED        The eGFR has been calculated using the MDRD equation. This calculation has not been validated in all clinical situations. eGFR's persistently <60 mL/min signify possible Chronic Kidney Disease.   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  We ultimately recommended continuing with course of intensity modulated radiation therapy and concurrent daily Temozolomide.  Radiation will be administered Mon-Fri over 6 weeks, Temodar will be dosed at 61m/m2 to be given daily over 42 days.  We reviewed side effects of temodar, including fatigue, nausea/vomiting, constipation, and cytopenias.  Chemotherapy should be held for the following:  ANC less than 1,000  Platelets less than 100,000  LFT or creatinine greater than 2x ULN  If clinical concerns/contraindications develop  Every 2 weeks during radiation, labs will be checked accompanied by a clinical evaluation in the brain tumor clinic.  2 months remain of rifampin and minocycline therapy for osteomyelitis, per  ID team.  Ok to dose chemotherapy concurrently.  He will continue Keppra 7538mBID for now.    We will reach out to social work to help arrange counseling.   Gordon [name] will return to clinic during week 4 of IMRT with labs for evaluation.  All questions were answered. The patient knows to call the clinic with any problems, questions or concerns. No barriers to learning were detected.  The total time spent in the encounter was 30 minutes and more than 50% was on counseling and review of test results   ZaVentura SellersMD Medical Director of Neuro-Oncology [facility]5/24/22 9:58 AM

## 2020-06-16 ENCOUNTER — Ambulatory Visit
Admission: RE | Admit: 2020-06-16 | Discharge: 2020-06-16 | Disposition: A | Discharge status: Home / Self Care | Payer: 59 | Source: Ambulatory Visit | Attending: Radiation Oncology | Admitting: Radiation Oncology

## 2020-06-16 ENCOUNTER — Other Ambulatory Visit: Payer: Self-pay

## 2020-06-16 DIAGNOSIS — C719 Malignant neoplasm of brain, unspecified: Secondary | ICD-10-CM | POA: Diagnosis not present

## 2020-06-17 ENCOUNTER — Other Ambulatory Visit: Payer: Self-pay | Admitting: Internal Medicine

## 2020-06-17 ENCOUNTER — Encounter: Payer: Self-pay | Admitting: *Deleted

## 2020-06-17 ENCOUNTER — Telehealth: Payer: Self-pay

## 2020-06-17 ENCOUNTER — Other Ambulatory Visit (HOSPITAL_COMMUNITY): Payer: Self-pay

## 2020-06-17 ENCOUNTER — Other Ambulatory Visit: Payer: Self-pay | Admitting: Pharmacist

## 2020-06-17 ENCOUNTER — Ambulatory Visit
Admission: RE | Admit: 2020-06-17 | Discharge: 2020-06-17 | Disposition: A | Discharge status: Home / Self Care | Payer: 59 | Source: Ambulatory Visit | Attending: Radiation Oncology | Admitting: Radiation Oncology

## 2020-06-17 DIAGNOSIS — C719 Malignant neoplasm of brain, unspecified: Secondary | ICD-10-CM

## 2020-06-17 MED ORDER — TEMOZOLOMIDE 140 MG PO CAPS
140.0000 mg | ORAL_CAPSULE | Freq: Every day | ORAL | 0 refills | Status: DC
  Filled 2020-06-17: qty 14, 14d supply, fill #0

## 2020-06-17 MED ORDER — ONDANSETRON HCL 8 MG PO TABS
8.0000 mg | ORAL_TABLET | Freq: Two times a day (BID) | ORAL | 1 refills | Status: DC | PRN
  Filled 2020-06-17: qty 30, 15d supply, fill #0

## 2020-06-17 MED ORDER — TEMOZOLOMIDE 20 MG PO CAPS
20.0000 mg | ORAL_CAPSULE | Freq: Every day | ORAL | 0 refills | Status: DC
  Filled 2020-06-17: qty 14, 14d supply, fill #0

## 2020-06-17 MED ORDER — TEMOZOLOMIDE 20 MG PO CAPS
20.0000 mg | ORAL_CAPSULE | Freq: Every day | ORAL | 0 refills | Status: DC
  Filled 2020-06-17: qty 28, 28d supply, fill #0

## 2020-06-17 MED ORDER — TEMOZOLOMIDE 20 MG PO CAPS: 20.0000 mg | ORAL_CAPSULE | Freq: Every day | ORAL | 0 refills | Status: DC

## 2020-06-17 MED ORDER — TEMOZOLOMIDE 140 MG PO CAPS: 140.0000 mg | ORAL_CAPSULE | Freq: Every day | ORAL | 0 refills | Status: DC

## 2020-06-17 MED ORDER — TEMOZOLOMIDE 140 MG PO CAPS
140.0000 mg | ORAL_CAPSULE | Freq: Every day | ORAL | 0 refills | Status: DC
  Filled 2020-06-17: qty 28, 28d supply, fill #0

## 2020-06-17 NOTE — Progress Notes (Signed)
[place] Work  Clinical Social Work was referred by neuro-oncology for assessment of psychosocial needs.  Clinical Social Worker contacted patient by phone to offer support and assess for needs.   Patient shared he feels he's "coping well" with cancer diagnosis and treatment.  He indicated experiencing "good and bad days", but overall had no feelings of anxiety or depression.  CSW briefly review common emotional responses to cancer diagnosis and normalized feelings of anxiety, stress, or sadness.    He shared he's had no major side effects that have impacted his health.  Patient did share he is unable to drive at this time- CSW and patient discussed loss of independence.  He reported typically enjoying driving and listening to music as a healthy coping mechanism.    Patient's support system includes his parents, stepsister, and friends.  He recently graduated from college and works at an [employer]. He enjoys finding and selling antiques.  CSW encouraged patient to follow up with CSW for counseling or brief support. Patient agreed to follow up with CSW as needed.    Gwinda Maine, LCSW  Clinical Social Worker [facility]

## 2020-06-17 NOTE — Progress Notes (Signed)
Oral Oncology Pharmacist Encounter  Patient's insurance allowed one time courtesy fill of Temodar (temozolomide) at [facility] and now requires that medication be filled through [facility] [employer] Prescription redirected to [facility]. Pharmacy tried reaching out to patient's mother, unable to reach and left voicemail.   Leron Croak, PharmD, BCPS Hematology/Oncology Clinical Pharmacist [facility] [phone] 06/17/2020 9:34 AM

## 2020-06-17 NOTE — Telephone Encounter (Signed)
Pts mother has been updated that this is in process and new rx has been sent. Also, I was advised the pharmacy has also been in touch with pts mother as well and they should have the medication ready by tomorrow.

## 2020-06-17 NOTE — Telephone Encounter (Signed)
Pts mother called requesting an urgent refill of Temozolomide 140mg  AND 20mg  for a total of 160mg  QD as pt only has 3 tablets left.  She is also requesting a refill of Zofran 8mg .  Pts mom states she wants to pick these up from [facility] instead of them being mailed because she does not want to risk delivery delays.

## 2020-06-18 ENCOUNTER — Ambulatory Visit
Admission: RE | Admit: 2020-06-18 | Discharge: 2020-06-18 | Disposition: A | Discharge status: Home / Self Care | Payer: 59 | Source: Ambulatory Visit | Attending: Radiation Oncology | Admitting: Radiation Oncology

## 2020-06-18 DIAGNOSIS — C719 Malignant neoplasm of brain, unspecified: Secondary | ICD-10-CM | POA: Diagnosis not present

## 2020-06-22 ENCOUNTER — Other Ambulatory Visit: Payer: Self-pay

## 2020-06-22 ENCOUNTER — Ambulatory Visit
Admission: RE | Admit: 2020-06-22 | Discharge: 2020-06-22 | Disposition: A | Discharge status: Home / Self Care | Payer: 59 | Source: Ambulatory Visit | Attending: Radiation Oncology | Admitting: Radiation Oncology

## 2020-06-22 ENCOUNTER — Other Ambulatory Visit (HOSPITAL_COMMUNITY): Payer: Self-pay

## 2020-06-22 DIAGNOSIS — C719 Malignant neoplasm of brain, unspecified: Secondary | ICD-10-CM | POA: Diagnosis not present

## 2020-06-23 ENCOUNTER — Ambulatory Visit
Admission: RE | Admit: 2020-06-23 | Discharge: 2020-06-23 | Disposition: A | Discharge status: Home / Self Care | Payer: 59 | Source: Ambulatory Visit | Attending: Radiation Oncology | Admitting: Radiation Oncology

## 2020-06-23 DIAGNOSIS — C719 Malignant neoplasm of brain, unspecified: Secondary | ICD-10-CM | POA: Insufficient documentation

## 2020-06-24 ENCOUNTER — Other Ambulatory Visit: Payer: Self-pay

## 2020-06-24 ENCOUNTER — Other Ambulatory Visit: Payer: 59

## 2020-06-24 ENCOUNTER — Ambulatory Visit: Payer: 59 | Admitting: Internal Medicine

## 2020-06-24 ENCOUNTER — Ambulatory Visit
Admission: RE | Admit: 2020-06-24 | Discharge: 2020-06-24 | Disposition: A | Discharge status: Home / Self Care | Payer: 59 | Source: Ambulatory Visit | Attending: Radiation Oncology | Admitting: Radiation Oncology

## 2020-06-24 DIAGNOSIS — C719 Malignant neoplasm of brain, unspecified: Secondary | ICD-10-CM | POA: Diagnosis not present

## 2020-06-25 ENCOUNTER — Ambulatory Visit
Admission: RE | Admit: 2020-06-25 | Discharge: 2020-06-25 | Disposition: A | Discharge status: Home / Self Care | Payer: 59 | Source: Ambulatory Visit | Attending: Radiation Oncology | Admitting: Radiation Oncology

## 2020-06-25 DIAGNOSIS — C719 Malignant neoplasm of brain, unspecified: Secondary | ICD-10-CM | POA: Diagnosis not present

## 2020-06-28 ENCOUNTER — Other Ambulatory Visit: Payer: Self-pay | Admitting: Internal Medicine

## 2020-06-28 ENCOUNTER — Ambulatory Visit
Admission: RE | Admit: 2020-06-28 | Discharge: 2020-06-28 | Disposition: A | Discharge status: Home / Self Care | Payer: 59 | Source: Ambulatory Visit | Attending: Radiation Oncology | Admitting: Radiation Oncology

## 2020-06-28 ENCOUNTER — Other Ambulatory Visit: Payer: Self-pay

## 2020-06-28 DIAGNOSIS — C719 Malignant neoplasm of brain, unspecified: Secondary | ICD-10-CM

## 2020-06-29 ENCOUNTER — Other Ambulatory Visit: Payer: Self-pay | Admitting: *Deleted

## 2020-06-29 ENCOUNTER — Inpatient Hospital Stay: Payer: 59

## 2020-06-29 ENCOUNTER — Inpatient Hospital Stay: Payer: 59 | Attending: Internal Medicine | Admitting: Internal Medicine

## 2020-06-29 ENCOUNTER — Ambulatory Visit
Admission: RE | Admit: 2020-06-29 | Discharge: 2020-06-29 | Disposition: A | Discharge status: Home / Self Care | Payer: 59 | Source: Ambulatory Visit | Attending: Radiation Oncology | Admitting: Radiation Oncology

## 2020-06-29 VITALS — BP 117/58 | HR 65 | Temp 98.8°F | Resp 18 | Wt 203.4 lb

## 2020-06-29 DIAGNOSIS — Z79899 Other long term (current) drug therapy: Secondary | ICD-10-CM | POA: Insufficient documentation

## 2020-06-29 DIAGNOSIS — C711 Malignant neoplasm of frontal lobe: Secondary | ICD-10-CM | POA: Diagnosis not present

## 2020-06-29 DIAGNOSIS — C719 Malignant neoplasm of brain, unspecified: Secondary | ICD-10-CM | POA: Diagnosis not present

## 2020-06-29 DIAGNOSIS — R569 Unspecified convulsions: Secondary | ICD-10-CM

## 2020-06-29 LAB — CMP (CANCER CENTER ONLY)
ALT: 37 U/L (ref 0–44)
AST: 21 U/L (ref 15–41)
Albumin: 4.3 g/dL (ref 3.5–5.0)
Alkaline Phosphatase: 75 U/L (ref 38–126)
Anion gap: 8 (ref 5–15)
BUN: 15 mg/dL (ref 6–20)
CO2: 26 mmol/L (ref 22–32)
Calcium: 9.7 mg/dL (ref 8.9–10.3)
Chloride: 104 mmol/L (ref 98–111)
Creatinine: 0.93 mg/dL (ref 0.61–1.24)
GFR, Estimated: >60 mL/min (ref >60)
Glucose, Bld: 88 mg/dL (ref 70–99)
Potassium: 4.5 mmol/L (ref 3.5–5.1)
Sodium: 138 mmol/L (ref 135–145)
Total Bilirubin: 0.5 mg/dL (ref 0.3–1.2)
Total Protein: 7.4 g/dL (ref 6.5–8.1)

## 2020-06-29 LAB — CBC WITH DIFFERENTIAL (CANCER CENTER ONLY)
Abs Immature Granulocytes: 0.02 10*3/uL (ref 0.00–0.07)
Basophils Absolute: 0 10*3/uL (ref 0.0–0.1)
Basophils Relative: 1 %
Eosinophils Absolute: 0.2 10*3/uL (ref 0.0–0.5)
Eosinophils Relative: 4 %
HCT: 41.8 % (ref 39.0–52.0)
Hemoglobin: 14.4 g/dL (ref 13.0–17.0)
Immature Granulocytes: 0 %
Lymphocytes Relative: 31 %
Lymphs Abs: 1.6 10*3/uL (ref 0.7–4.0)
MCH: 29.8 pg (ref 26.0–34.0)
MCHC: 34.4 g/dL (ref 30.0–36.0)
MCV: 86.5 fL (ref 80.0–100.0)
Monocytes Absolute: 0.5 10*3/uL (ref 0.1–1.0)
Monocytes Relative: 10 %
Neutro Abs: 2.8 10*3/uL (ref 1.7–7.7)
Neutrophils Relative %: 54 %
Platelet Count: 260 10*3/uL (ref 150–400)
RBC: 4.83 MIL/uL (ref 4.22–5.81)
RDW: 13.2 % (ref 11.5–15.5)
WBC Count: 5.1 10*3/uL (ref 4.0–10.5)
nRBC: 0 % (ref 0.0–0.2)

## 2020-06-29 NOTE — Progress Notes (Signed)
[facility] at [place], [place] [address] [phone]   Interval Evaluation  Date of Service: 06/29/20 Patient Name: Gordon [name] Patient MRN: [id number] Patient DOB: 1995/08/28 Provider: Ventura Sellers, MD  Identifying Statement:  Gordon [name] is a 25 y.o. 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; path is grade 4 astrocytoma, IDH-1 mutant   04/20/2020 Surgery   Washout of surgical site collection; speciates MSSA   05/17/2020 Surgery   Repeat wound exploration, irrigation.   06/07/2020 -  Radiation Therapy   IMRT and concurrent Temodar 67m/m2     Biomarkers:  MGMT Unknown.  IDH 1/2 Mutated.  EGFR Unknown  TERT Unknown   Interval History:  [name] to clinic for follow up today, now having completed 4 weeks of radiation and Temozolomide.  No issues thus far with chemo or radiation aside from hair loss.  Continues on antibiotics per ID schedule (6 weeks remaining).  Staying active but not driving at this time.  Working bCatering manager  H+P (04/12/20) Patient presented to medical attention three weeks prior with episode of loss of awareness and generalized shaking consistent with seizure episode.  CNS imaging demonstrated an enhancing left frontal mass, which was resected on 04/04/20 by [name]  He tolerated surgery well, has no significant complaints at this time.  Last day of decadron taper is today.  He has been more or less independent at home, starting to resume some of his studies at school.  Currently a business student at [employer]  No other significant medical history.  Medications: Current Outpatient Medications on File Prior to Visit  Medication Sig Dispense Refill  . levETIRAcetam (KEPPRA) 750 MG tablet  Take 1 tablet (750 mg total) by mouth 2 (two) times daily. 60 tablet 5  . minocycline (MINOCIN) 100 MG capsule Take 1 capsule (100 mg total) by mouth 2 (two) times daily. 60 capsule 2  . Multiple Vitamin (MULTIVITAMIN PO) Take 1 capsule by mouth daily.    . Omega-3 Fatty Acids (OMEGA 3 PO) Take 1 capsule by mouth daily.    . ondansetron (ZOFRAN) 8 MG tablet Take 1 tablet (8 mg total) by mouth 2 (two) times daily as needed (nausea and vomiting). May take 30-60 minutes prior to Temodar administration if nausea/vomiting occurs. 30 tablet 1  . Probiotic Product (PROBIOTIC PO) Take 1 capsule by mouth daily.    .Marland Kitchentemozolomide (TEMODAR) 140 MG capsule Take 1 capsule (140 mg total) by mouth daily. May take on an empty stomach to decrease nausea & vomiting. 14 capsule 0  . temozolomide (TEMODAR) 20 MG capsule Take 1 capsule (20 mg total) by mouth daily. May take on an empty stomach to decrease nausea & vomiting. 14 capsule 0   No current facility-administered medications on file prior to visit.    Allergies: No Known Allergies Past Medical History:  Past Medical History:  Diagnosis Date  . Bulging lumbar disc   . Complication of anesthesia   . PONV (postoperative nausea and vomiting)    Vomited x1 after craniotomy surgery  . Seizure ([place] 04/03/2020   x 1 at home pre-admission.  Otherwise, no history.   Past Surgical History:  Past Surgical History:  Procedure Laterality Date  . BURR HOLE Right 04/20/2020   Procedure: Irrigation  and Debridement of Cranial Wound;  Surgeon: [name], MD;  Location: [facility];  Service: Neurosurgery;  Laterality: Right;  . CRANIOTOMY Left 04/04/2020   Procedure: LEFT CRANIOTOMY FOR TUMOR EXCISION;  Surgeon: [name], MD;  Location: [facility];  Service: Neurosurgery;  Laterality: Left;  . TONSILLECTOMY    . WISDOM TOOTH EXTRACTION    . WOUND EXPLORATION Left 05/17/2020   Procedure: CRANIAL WOUND EXPLORATION WITH PLACEMENT OF MESH;  Surgeon: [name], MD;  Location: [facility];  Service: Neurosurgery;  Laterality: Left;   Social History:  Social History   Socioeconomic History  . Marital status: Single    Spouse name: Not on file  . Number of children: 0  . Years of education: Not on file  . Highest education level: Not on file  Occupational History  . Not on file  Tobacco Use  . Smoking status: Never Smoker  . Smokeless tobacco: Never Used  Vaping Use  . Vaping Use: Never used  Substance and Sexual Activity  . Alcohol use: No    Alcohol/week: 0.0 standard drinks  . Drug use: No  . Sexual activity: Yes    Birth control/protection: Condom  Other Topics Concern  . Not on file  Social History Narrative  . Not on file   Social Determinants of Health   Financial Resource Strain: Not on file  Food Insecurity: Not on file  Transportation Needs: Not on file  Physical Activity: Not on file  Stress: Not on file  Social Connections: Not on file  Intimate Partner Violence: Not on file   Family History: No family history on file.  Review of Systems: Constitutional: Doesn't report fevers, chills or abnormal weight loss Eyes: Doesn't report blurriness of vision Ears, nose, mouth, throat, and face: Doesn't report sore throat Respiratory: Doesn't report cough, dyspnea or wheezes Cardiovascular: Doesn't report palpitation, chest discomfort  Gastrointestinal:  Doesn't report nausea, constipation, diarrhea GU: Doesn't report incontinence Skin: Doesn't report skin rashes Neurological: Per HPI Musculoskeletal: Doesn't report joint pain Behavioral/Psych: Doesn't report anxiety  Physical Exam: Vitals:   06/29/20 0838  BP: (!) 117/58  Pulse: 65  Resp: 18  Temp: 98.8 F (37.1 C)  SpO2: 100%   KPS: 90. General: Alert, cooperative, pleasant, in no acute distress Head: Normal EENT: No conjunctival injection or scleral icterus.  Lungs: Resp effort normal Cardiac: Regular rate Abdomen: Non-distended abdomen Skin: No  rashes cyanosis or petechiae. Extremities: No clubbing or edema  Neurologic Exam: Mental Status: Awake, alert, attentive to examiner. Oriented to self and environment. Language is fluent with intact comprehension.  Cranial Nerves: Visual acuity is grossly normal. Visual fields are full. Extra-ocular movements intact. No ptosis. Face is symmetric Motor: Tone and bulk are normal. Power is full in both arms and legs. Reflexes are symmetric, no pathologic reflexes present.  Sensory: Intact to light touch Gait: Normal.   Labs: I have reviewed the data as listed    Component Value Date/Time   NA 141 06/15/2020 0947   K 4.5 06/15/2020 0947   CL 105 06/15/2020 0947   CO2 28 06/15/2020 0947   GLUCOSE 82 06/15/2020 0947   BUN 13 06/15/2020 0947   CREATININE 0.86 06/15/2020 0947   CREATININE 0.87 06/02/2020 1416   CALCIUM 9.8 06/15/2020 0947   PROT 7.5 06/15/2020 0947   ALBUMIN 4.3 06/15/2020 0947   AST 19 06/15/2020 0947   ALT 24 06/15/2020 0947   ALKPHOS 77 06/15/2020 0947   BILITOT 0.3 06/15/2020  [place] 06/15/2020 0947   GFRAA  08/10/2009 2150    NOT CALCULATED        The eGFR has been calculated using the MDRD equation. This calculation has not been validated in all clinical situations. eGFR's persistently <60 mL/min signify possible Chronic Kidney Disease.   Lab Results  Component Value Date   WBC 4.8 06/15/2020   NEUTROABS 2.4 06/15/2020   HGB 14.2 06/15/2020   HCT 41.4 06/15/2020   MCV 86.8 06/15/2020   PLT 212 06/15/2020     Assessment/Plan 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[facility])  Seizure ([place])  Abhinav Mayorquin Bar[place] is clinically stable today, now having completed 4 weeks of IMRT and Temozolomide.  Labs are within normal limits today.  We ultimately recommended continuing with course of intensity modulated radiation therapy and concurrent daily Temozolomide.  Radiation will be administered Mon-Fri over 6 weeks, Temodar will be dosed at 74m/m2 to be given daily  over 42 days.  We reviewed side effects of temodar, including fatigue, nausea/vomiting, constipation, and cytopenias.  Chemotherapy should be held for the following:  ANC less than 1,000  Platelets less than 100,000  LFT or creatinine greater than 2x ULN  If clinical concerns/contraindications develop  Every 2 weeks during radiation, labs will be checked accompanied by a clinical evaluation in the brain tumor clinic.  6 weeks remain of rifampin and minocycline therapy for osteomyelitis, per ID team.  Ok to dose chemotherapy concurrently.  He will continue Keppra 7547mBID for now.    AnNayden Czajkaranford will return to clinic during week 6 of IMRT with labs for evaluation.  All questions were answered. The patient knows to call the clinic with any problems, questions or concerns. No barriers to learning were detected.  The total time spent in the encounter was 30 minutes and more than 50% was on counseling and review of test results   ZaVentura SellersMD Medical Director of Neuro-Oncology [facility]6/07/22 8:33 AM

## 2020-06-30 ENCOUNTER — Ambulatory Visit
Admission: RE | Admit: 2020-06-30 | Discharge: 2020-06-30 | Disposition: A | Discharge status: Home / Self Care | Payer: 59 | Source: Ambulatory Visit | Attending: Radiation Oncology | Admitting: Radiation Oncology

## 2020-06-30 ENCOUNTER — Other Ambulatory Visit: Payer: Self-pay

## 2020-06-30 DIAGNOSIS — C719 Malignant neoplasm of brain, unspecified: Secondary | ICD-10-CM | POA: Diagnosis not present

## 2020-07-01 ENCOUNTER — Ambulatory Visit
Admission: RE | Admit: 2020-07-01 | Discharge: 2020-07-01 | Disposition: A | Discharge status: Home / Self Care | Payer: 59 | Source: Ambulatory Visit | Attending: Radiation Oncology | Admitting: Radiation Oncology

## 2020-07-01 ENCOUNTER — Ambulatory Visit (INDEPENDENT_AMBULATORY_CARE_PROVIDER_SITE_OTHER): Payer: 59 | Admitting: Internal Medicine

## 2020-07-01 ENCOUNTER — Other Ambulatory Visit: Payer: Self-pay

## 2020-07-01 ENCOUNTER — Encounter: Payer: Self-pay | Admitting: Internal Medicine

## 2020-07-01 VITALS — BP 105/68 | HR 72 | Resp 16 | Wt 204.0 lb

## 2020-07-01 DIAGNOSIS — T847XXD Infection and inflammatory reaction due to other internal orthopedic prosthetic devices, implants and grafts, subsequent encounter: Secondary | ICD-10-CM

## 2020-07-01 DIAGNOSIS — M869 Osteomyelitis, unspecified: Secondary | ICD-10-CM

## 2020-07-01 DIAGNOSIS — C719 Malignant neoplasm of brain, unspecified: Secondary | ICD-10-CM | POA: Diagnosis not present

## 2020-07-01 NOTE — Progress Notes (Signed)
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   MRN: [id number]  Chief Complaint  Patient presents with   Follow-up    HPI:  Gordon [name] is a 25 y.o. 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 cx the second time MRSE and mssa but no p. Acnes  He is doing well on mino/rifampin No seizure. Just taking keppra for seizure  Chemo tx of tumor planned soon once infection controlled and wound healed.   07/01/2020 id clinic f/u Patient had been continued on chemo for his brain cancer Doing well so far No issue with surgical site/titanium mesh wound infection No n/v/diarrhea No joint pain  No pigmentation    No Known Allergies    Outpatient Medications Prior to Visit  Medication Sig Dispense Refill   levETIRAcetam (KEPPRA) 750 MG tablet Take 1 tablet (750 mg total) by mouth 2 (two) times daily. 60 tablet 5   minocycline (MINOCIN) 100 MG capsule Take 1 capsule (100 mg total) by mouth 2 (two) times daily. 60 capsule 2   Multiple Vitamin (MULTIVITAMIN PO) Take 1 capsule by mouth daily.     Omega-3 Fatty Acids (OMEGA 3 PO) Take 1 capsule by mouth daily.     ondansetron (ZOFRAN) 8 MG tablet Take 1 tablet (8 mg total) by mouth 2 (two) times daily as needed (nausea and vomiting). May take 30-60 minutes prior to Temodar administration if nausea/vomiting occurs. 30  tablet 1   Probiotic Product (PROBIOTIC PO) Take 1 capsule by mouth daily.     temozolomide (TEMODAR) 140 MG capsule Take 1 capsule (140 mg total) by mouth daily. May take on an empty stomach to decrease nausea & vomiting. 14 capsule 0   temozolomide (TEMODAR) 20 MG capsule Take 1 capsule (20 mg total) by mouth daily. May take on an empty stomach to decrease nausea & vomiting. 14 capsule 0   No facility-administered medications prior to visit.     Social History   Socioeconomic History   Marital status: Single    Spouse name: Not on file   Number of children: 0   Years of education: Not on file   Highest education level: Not on file  Occupational History   Not on file  Tobacco Use   Smoking status: Never   Smokeless tobacco: Never  Vaping Use   Vaping Use: Never used  Substance and Sexual Activity   Alcohol use: No    Alcohol/week: 0.0 standard drinks   Drug use: No   Sexual activity: Yes    Birth control/protection: Condom  Other Topics Concern   Not on file  Social History Narrative   Not on file   Social Determinants of Health   Financial Resource Strain: Not on file  Food Insecurity: Not on file  Transportation Needs: Not on file  Physical  Activity: Not on file  Stress: Not on file  Social Connections: Not on file  Intimate Partner Violence: Not on file      Review of Systems    All other ros negative  Objective:    BP 105/68   Pulse 72   Resp 16   Wt 204 lb (92.5 kg)   SpO2 100%   BMI 25.50 kg/m  Nursing note and vital signs reviewed.  Physical Exam  General/constitutional: no distress, pleasant HEENT: Normocephalic, PER, Conj Clear, EOMI, Oropharynx clear; incision healed no fluctuance/redness/swelling/tenderness Neck supple CV: rrr no mrg Lungs: clear to auscultation, normal respiratory effort Abd: Soft, Nontender Ext: no edema Skin: No Rash Neuro: nonfocal MSK: no peripheral joint swelling/tenderness/warmth; back spines  nontender        Labs: Lab Results  Component Value Date   WBC 5.1 06/29/2020   HGB 14.4 06/29/2020   HCT 41.8 06/29/2020   MCV 86.5 06/29/2020   PLT 260 88/41/6606   Last metabolic panel Lab Results  Component Value Date   GLUCOSE 88 06/29/2020   NA 138 06/29/2020   K 4.5 06/29/2020   CL 104 06/29/2020   CO2 26 06/29/2020   BUN 15 06/29/2020   CREATININE 0.93 06/29/2020   GFRNONAA >60 06/29/2020   GFRAA  08/10/2009    NOT CALCULATED        The eGFR has been calculated using the MDRD equation. This calculation has not been validated in all clinical situations. eGFR's persistently <60 mL/min signify possible Chronic Kidney Disease.   CALCIUM 9.7 06/29/2020   PROT 7.4 06/29/2020   ALBUMIN 4.3 06/29/2020   BILITOT 0.5 06/29/2020   ALKPHOS 75 06/29/2020   AST 21 06/29/2020   ALT 37 06/29/2020   ANIONGAP 8 06/29/2020   Crp: 5/11  1.2 4/23  5.1  Micro: 4/25 OR debridement cx Mrse (S bactrim, tetra) Mssa (S bactrim, tetra)  Serology:  Imaging: 4/23 mri brain wwo contrast IMPRESSION: 1. Status post left frontal tumor resection with decreased blood products at the resection site. Nonspecific contrast enhancement surrounding the resection cavity may be postsurgical. 2. Decreased size of scalp fluid collection overlying the craniotomy site. No specific features of infection. 3. Dural thickening underlying the craniotomy, likely reactive.      Assessment & Plan:   Problem List Items Addressed This Visit       Other   Hardware complicating wound infection ([place])   Relevant Orders   CBC   Comprehensive metabolic panel   C-reactive protein   Other Visit Diagnoses     Osteomyelitis, unspecified site, unspecified type ([place])    -  Primary   Relevant Orders   CBC   Comprehensive metabolic panel   C-reactive protein       Abx: 4/26-c mino/rifampin (300 rif bid)  25 yo male hx brain tumore s/p excision 3/13 with placement of titanium plate,  complicated by post op wound infection s/o I&D 3/29. Initial cx mssa and p-acnes s/p short course of abx, but readmitted for ongoing infection s/p repeat I&D 4/25 with placement of titanium mesh placement  Continue to do well Tolerating antibiotics Labs appear without abx toxicity. Crp not checked, will need to trend  Abx planned 3 months mino/rifampin until end of 07/2019, then indefinitely with doxycycline or until mesh can be removed    -continue mino/rifampin -f/u 8 weeks -labs today  I have spent a total of 20 minutes of face-to-face and non-face-to-face time, excluding clinical staff time, preparing to see  patient, ordering tests and/or medications, and provide counseling the patient     Follow-up: Return in about 8 weeks (around 08/26/2020).      Jabier Mutton, [place] for [place] [phone]  pager   [phone] cell 07/01/2020, 2:37 PM

## 2020-07-01 NOTE — Patient Instructions (Signed)
You are doing very well   Continue minocycline/rifampin until end of 07/2020   If oncology is to do labs, please have them do CBC, CMP, and CRP (so we don't have to do it here if timing is close to this visit). I have ordered a set of labs to do in august though if you haven't had blood test for over a month at that time   Follow up with me sometimes in august

## 2020-07-02 ENCOUNTER — Other Ambulatory Visit: Payer: Self-pay

## 2020-07-02 ENCOUNTER — Ambulatory Visit
Admission: RE | Admit: 2020-07-02 | Discharge: 2020-07-02 | Disposition: A | Discharge status: Home / Self Care | Payer: 59 | Source: Ambulatory Visit | Attending: Radiation Oncology | Admitting: Radiation Oncology

## 2020-07-02 DIAGNOSIS — C719 Malignant neoplasm of brain, unspecified: Secondary | ICD-10-CM | POA: Diagnosis not present

## 2020-07-05 ENCOUNTER — Ambulatory Visit: Payer: 59

## 2020-07-05 ENCOUNTER — Ambulatory Visit
Admission: RE | Admit: 2020-07-05 | Discharge: 2020-07-05 | Disposition: A | Discharge status: Home / Self Care | Payer: 59 | Source: Ambulatory Visit | Attending: Radiation Oncology | Admitting: Radiation Oncology

## 2020-07-05 ENCOUNTER — Other Ambulatory Visit: Payer: Self-pay

## 2020-07-05 DIAGNOSIS — C719 Malignant neoplasm of brain, unspecified: Secondary | ICD-10-CM | POA: Diagnosis not present

## 2020-07-06 ENCOUNTER — Encounter (HOSPITAL_COMMUNITY): Payer: Self-pay

## 2020-07-06 ENCOUNTER — Ambulatory Visit
Admission: RE | Admit: 2020-07-06 | Discharge: 2020-07-06 | Disposition: A | Discharge status: Home / Self Care | Payer: 59 | Source: Ambulatory Visit | Attending: Radiation Oncology | Admitting: Radiation Oncology

## 2020-07-06 DIAGNOSIS — C719 Malignant neoplasm of brain, unspecified: Secondary | ICD-10-CM | POA: Diagnosis not present

## 2020-07-07 ENCOUNTER — Other Ambulatory Visit: Payer: Self-pay

## 2020-07-07 ENCOUNTER — Ambulatory Visit
Admission: RE | Admit: 2020-07-07 | Discharge: 2020-07-07 | Disposition: A | Discharge status: Home / Self Care | Payer: 59 | Source: Ambulatory Visit | Attending: Radiation Oncology | Admitting: Radiation Oncology

## 2020-07-07 DIAGNOSIS — C719 Malignant neoplasm of brain, unspecified: Secondary | ICD-10-CM | POA: Diagnosis not present

## 2020-07-08 ENCOUNTER — Ambulatory Visit
Admission: RE | Admit: 2020-07-08 | Discharge: 2020-07-08 | Disposition: A | Discharge status: Home / Self Care | Payer: 59 | Source: Ambulatory Visit | Attending: Radiation Oncology | Admitting: Radiation Oncology

## 2020-07-08 DIAGNOSIS — C719 Malignant neoplasm of brain, unspecified: Secondary | ICD-10-CM | POA: Diagnosis not present

## 2020-07-09 ENCOUNTER — Other Ambulatory Visit: Payer: Self-pay

## 2020-07-09 ENCOUNTER — Ambulatory Visit: Payer: 59

## 2020-07-09 ENCOUNTER — Ambulatory Visit
Admission: RE | Admit: 2020-07-09 | Discharge: 2020-07-09 | Disposition: A | Discharge status: Home / Self Care | Payer: 59 | Source: Ambulatory Visit | Attending: Radiation Oncology | Admitting: Radiation Oncology

## 2020-07-09 DIAGNOSIS — C719 Malignant neoplasm of brain, unspecified: Secondary | ICD-10-CM | POA: Diagnosis not present

## 2020-07-10 ENCOUNTER — Ambulatory Visit: Payer: 59

## 2020-07-12 ENCOUNTER — Ambulatory Visit: Payer: 59

## 2020-07-12 ENCOUNTER — Ambulatory Visit
Admission: RE | Admit: 2020-07-12 | Discharge: 2020-07-12 | Disposition: A | Discharge status: Home / Self Care | Payer: 59 | Source: Ambulatory Visit | Attending: Radiation Oncology | Admitting: Radiation Oncology

## 2020-07-12 ENCOUNTER — Other Ambulatory Visit: Payer: Self-pay

## 2020-07-12 DIAGNOSIS — C719 Malignant neoplasm of brain, unspecified: Secondary | ICD-10-CM | POA: Diagnosis not present

## 2020-07-13 ENCOUNTER — Inpatient Hospital Stay: Payer: 59

## 2020-07-13 ENCOUNTER — Inpatient Hospital Stay (HOSPITAL_BASED_OUTPATIENT_CLINIC_OR_DEPARTMENT_OTHER): Payer: 59 | Admitting: Internal Medicine

## 2020-07-13 ENCOUNTER — Ambulatory Visit: Payer: 59

## 2020-07-13 ENCOUNTER — Ambulatory Visit
Admission: RE | Admit: 2020-07-13 | Discharge: 2020-07-13 | Disposition: A | Discharge status: Home / Self Care | Payer: 59 | Source: Ambulatory Visit | Attending: Radiation Oncology | Admitting: Radiation Oncology

## 2020-07-13 VITALS — BP 126/75 | HR 69 | Temp 97.7°F | Resp 17 | Ht 75.0 in | Wt 208.0 lb

## 2020-07-13 DIAGNOSIS — C719 Malignant neoplasm of brain, unspecified: Secondary | ICD-10-CM

## 2020-07-13 DIAGNOSIS — R569 Unspecified convulsions: Secondary | ICD-10-CM | POA: Diagnosis not present

## 2020-07-13 DIAGNOSIS — C711 Malignant neoplasm of frontal lobe: Secondary | ICD-10-CM | POA: Diagnosis not present

## 2020-07-13 LAB — CBC WITH DIFFERENTIAL (CANCER CENTER ONLY)
Abs Immature Granulocytes: 0.01 10*3/uL (ref 0.00–0.07)
Basophils Absolute: 0.1 10*3/uL (ref 0.0–0.1)
Basophils Relative: 1 %
Eosinophils Absolute: 0.2 10*3/uL (ref 0.0–0.5)
Eosinophils Relative: 4 %
HCT: 43.1 % (ref 39.0–52.0)
Hemoglobin: 14.6 g/dL (ref 13.0–17.0)
Immature Granulocytes: 0 %
Lymphocytes Relative: 29 %
Lymphs Abs: 1.4 10*3/uL (ref 0.7–4.0)
MCH: 29.6 pg (ref 26.0–34.0)
MCHC: 33.9 g/dL (ref 30.0–36.0)
MCV: 87.2 fL (ref 80.0–100.0)
Monocytes Absolute: 0.5 10*3/uL (ref 0.1–1.0)
Monocytes Relative: 9 %
Neutro Abs: 2.9 10*3/uL (ref 1.7–7.7)
Neutrophils Relative %: 57 %
Platelet Count: 234 10*3/uL (ref 150–400)
RBC: 4.94 MIL/uL (ref 4.22–5.81)
RDW: 13.2 % (ref 11.5–15.5)
WBC Count: 5 10*3/uL (ref 4.0–10.5)
nRBC: 0 % (ref 0.0–0.2)

## 2020-07-13 LAB — CMP (CANCER CENTER ONLY)
ALT: 59 U/L — ABNORMAL HIGH (ref 0–44)
AST: 22 U/L (ref 15–41)
Albumin: 4.6 g/dL (ref 3.5–5.0)
Alkaline Phosphatase: 67 U/L (ref 38–126)
Anion gap: 5 (ref 5–15)
BUN: 13 mg/dL (ref 6–20)
CO2: 28 mmol/L (ref 22–32)
Calcium: 9.7 mg/dL (ref 8.9–10.3)
Chloride: 104 mmol/L (ref 98–111)
Creatinine: 0.91 mg/dL (ref 0.61–1.24)
GFR, Estimated: >60 mL/min (ref >60)
Glucose, Bld: 90 mg/dL (ref 70–99)
Potassium: 4.7 mmol/L (ref 3.5–5.1)
Sodium: 137 mmol/L (ref 135–145)
Total Bilirubin: 0.6 mg/dL (ref 0.3–1.2)
Total Protein: 7.7 g/dL (ref 6.5–8.1)

## 2020-07-13 NOTE — Progress Notes (Signed)
[facility] [place], [place] [address] [phone]   Interval Evaluation  Date of Service: 07/13/20 Patient Name: Gordon [name] Patient MRN: [id number] Patient DOB: 11/28/1995 Provider: Ventura Sellers, MD  Identifying Statement:  Gordon [name] is a 25 y.o. male with left frontal  grade 4 astrocytoma    Oncologic History: Oncology History  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[place])  04/04/2020 Surgery   Craniotomy, resection of left frontal mass with [name]; path is grade 4 astrocytoma, IDH-1 mutant   04/20/2020 Surgery   [place] of surgical site collection; speciates MSSA   05/17/2020 Surgery   Repeat wound exploration, irrigation.   06/07/2020 -  Radiation Therapy   IMRT and concurrent Temodar 85m/m2     Biomarkers:  MGMT Unknown.  IDH 1/2 Mutated.  EGFR Unknown  TERT Unknown   Interval History:  Gordon Calabresepresents to clinic for follow up today, now in final week of radiation and Temozolomide.  Still tolerating chemo and radiation well without complication.  Continues on antibiotics per ID schedule (4 weeks remaining).  Staying active but not driving at this time.  Working bCatering manager  H+P (04/12/20) Patient presented to medical attention three weeks prior with episode of loss of awareness and generalized shaking consistent with seizure episode.  CNS imaging demonstrated an enhancing left frontal mass, which was resected on 04/04/20 by [name]  He tolerated surgery well, has no significant complaints at this time.  Last day of decadron taper is today.  He has been more or less independent at home, starting to resume some of his studies at school.  Currently a business student at [employer]  No other significant medical history.  Medications: Current Outpatient Medications on File Prior to Visit  Medication Sig Dispense Refill   levETIRAcetam (KEPPRA) 750 MG tablet Take 1  tablet (750 mg total) by mouth 2 (two) times daily. 60 tablet 5   minocycline (MINOCIN) 100 MG capsule Take 1 capsule (100 mg total) by mouth 2 (two) times daily. 60 capsule 2   Multiple Vitamin (MULTIVITAMIN PO) Take 1 capsule by mouth daily.     Omega-3 Fatty Acids (OMEGA 3 PO) Take 1 capsule by mouth daily.     ondansetron (ZOFRAN) 8 MG tablet Take 1 tablet (8 mg total) by mouth 2 (two) times daily as needed (nausea and vomiting). May take 30-60 minutes prior to Temodar administration if nausea/vomiting occurs. 30 tablet 1   Probiotic Product (PROBIOTIC PO) Take 1 capsule by mouth daily.     rifampin (RIFADIN) 300 MG capsule Take 300 mg by mouth 2 (two) times daily.     temozolomide (TEMODAR) 140 MG capsule Take 1 capsule (140 mg total) by mouth daily. May take on an empty stomach to decrease nausea & vomiting. 14 capsule 0   temozolomide (TEMODAR) 20 MG capsule Take 1 capsule (20 mg total) by mouth daily. May take on an empty stomach to decrease nausea & vomiting. 14 capsule 0   No current facility-administered medications on file prior to visit.    Allergies: No Known Allergies Past Medical History:  Past Medical History:  Diagnosis Date   Bulging lumbar disc    Complication of anesthesia    PONV (postoperative nausea and vomiting)    Vomited x1 after craniotomy surgery   Seizure ([place] 04/03/2020   x 1 at home pre-admission.  Otherwise, no history.   Past Surgical History:  Past Surgical  History:  Procedure Laterality Date   BURR HOLE Right 04/20/2020   Procedure: Irrigation and Debridement of Cranial Wound;  Surgeon: [name], MD;  Location: [facility];  Service: Neurosurgery;  Laterality: Right;   CRANIOTOMY Left 04/04/2020   Procedure: LEFT CRANIOTOMY FOR TUMOR EXCISION;  Surgeon: [name], MD;  Location: [facility];  Service: Neurosurgery;  Laterality: Left;   TONSILLECTOMY     WISDOM TOOTH EXTRACTION     WOUND EXPLORATION Left 05/17/2020   Procedure: CRANIAL WOUND  EXPLORATION WITH PLACEMENT OF MESH;  Surgeon: [name], MD;  Location: [facility];  Service: Neurosurgery;  Laterality: Left;   Social History:  Social History   Socioeconomic History   Marital status: Single    Spouse name: Not on file   Number of children: 0   Years of education: Not on file   Highest education level: Not on file  Occupational History   Not on file  Tobacco Use   Smoking status: Never   Smokeless tobacco: Never  Vaping Use   Vaping Use: Never used  Substance and Sexual Activity   Alcohol use: No    Alcohol/week: 0.0 standard drinks   Drug use: No   Sexual activity: Yes    Birth control/protection: Condom  Other Topics Concern   Not on file  Social History Narrative   Not on file   Social Determinants of Health   Financial Resource Strain: Not on file  Food Insecurity: Not on file  Transportation Needs: Not on file  Physical Activity: Not on file  Stress: Not on file  Social Connections: Not on file  Intimate Partner Violence: Not on file   Family History: No family history on file.  Review of Systems: Constitutional: Doesn't report fevers, chills or abnormal weight loss Eyes: Doesn't report blurriness of vision Ears, nose, mouth, throat, and face: Doesn't report sore throat Respiratory: Doesn't report cough, dyspnea or wheezes Cardiovascular: Doesn't report palpitation, chest discomfort  Gastrointestinal:  Doesn't report nausea, constipation, diarrhea GU: Doesn't report incontinence Skin: Doesn't report skin rashes Neurological: Per HPI Musculoskeletal: Doesn't report joint pain Behavioral/Psych: Doesn't report anxiety  Physical Exam: Vitals:   07/13/20 1025  BP: 126/75  Pulse: 69  Resp: 17  Temp: 97.7 F (36.5 C)  SpO2: 100%   KPS: 90. General: Alert, cooperative, pleasant, in no acute distress Head: Normal EENT: No conjunctival injection or scleral icterus.  Lungs: Resp effort normal Cardiac: Regular rate Abdomen:  Non-distended abdomen Skin: No rashes cyanosis or petechiae. Extremities: No clubbing or edema  Neurologic Exam: Mental Status: Awake, alert, attentive to examiner. Oriented to self and environment. Language is fluent with intact comprehension.  Cranial Nerves: Visual acuity is grossly normal. Visual fields are full. Extra-ocular movements intact. No ptosis. Face is symmetric Motor: Tone and bulk are normal. Power is full in both arms and legs. Reflexes are symmetric, no pathologic reflexes present.  Sensory: Intact to light touch Gait: Normal.   Labs: I have reviewed the data as listed    Component Value Date/Time   NA 138 06/29/2020 0821   K 4.5 06/29/2020 0821   CL 104 06/29/2020 0821   CO2 26 06/29/2020 0821   GLUCOSE 88 06/29/2020 0821   BUN 15 06/29/2020 0821   CREATININE 0.93 06/29/2020 0821   CREATININE 0.87 06/02/2020 1416   CALCIUM 9.7 06/29/2020 0821   PROT 7.4 06/29/2020 0821   ALBUMIN 4.3 06/29/2020 0821   AST 21 06/29/2020 0821   ALT 37 06/29/2020  0821   ALKPHOS 75 06/29/2020 0821   BILITOT 0.5 06/29/2020 0821   GFRNONAA >60 06/29/2020 0821   GFRAA  08/10/2009 2150    NOT CALCULATED        The eGFR has been calculated using the MDRD equation. This calculation has not been validated in all clinical situations. eGFR's persistently <60 mL/min signify possible Chronic Kidney Disease.   Lab Results  Component Value Date   WBC 5.0 07/13/2020   NEUTROABS 2.9 07/13/2020   HGB 14.6 07/13/2020   HCT 43.1 07/13/2020   MCV 87.2 07/13/2020   PLT 234 07/13/2020     Assessment/Plan 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[facility])  Seizure ([place])  Gordon [name] is clinically stable today, now having completed almost 6 weeks of IMRT and Temozolomide.  Labs are within normal limits today.  We ultimately recommended continuing with course of intensity modulated radiation therapy and concurrent daily Temozolomide.  Radiation will be administered Mon-Fri over 6 weeks, Temodar will  be dosed at 36m/m2 to be given daily over 42 days.  We reviewed side effects of temodar, including fatigue, nausea/vomiting, constipation, and cytopenias.  Chemotherapy should be held for the following:  ANC less than 1,000  Platelets less than 100,000  LFT or creatinine greater than 2x ULN  If clinical concerns/contraindications develop  Every 2 weeks during radiation, labs will be checked accompanied by a clinical evaluation in the brain tumor clinic.  4 weeks remain of rifampin and minocycline therapy for osteomyelitis, per ID team.  Ok to dose chemotherapy concurrently.  He will continue Keppra 7549mBID for now.    Gordon [name] will obtain MRI brain and follow up with Dr. DeImagene Richesor treatment planning.  We will follow up with him via phone shortly following that visit to communicate treatment plan.  All questions were answered. The patient knows to call the clinic with any problems, questions or concerns. No barriers to learning were detected.  The total time spent in the encounter was 30 minutes and more than 50% was on counseling and review of test results   ZaVentura SellersMD Medical Director of Neuro-Oncology [facility] [place]6/21/22 10:33 AM

## 2020-07-14 ENCOUNTER — Encounter: Payer: Self-pay | Admitting: Internal Medicine

## 2020-07-14 ENCOUNTER — Other Ambulatory Visit: Payer: Self-pay

## 2020-07-14 ENCOUNTER — Ambulatory Visit: Payer: 59

## 2020-07-14 ENCOUNTER — Ambulatory Visit
Admission: RE | Admit: 2020-07-14 | Discharge: 2020-07-14 | Disposition: A | Discharge status: Home / Self Care | Payer: 59 | Source: Ambulatory Visit | Attending: Radiation Oncology | Admitting: Radiation Oncology

## 2020-07-14 DIAGNOSIS — C719 Malignant neoplasm of brain, unspecified: Secondary | ICD-10-CM | POA: Diagnosis not present

## 2020-07-15 ENCOUNTER — Ambulatory Visit
Admission: RE | Admit: 2020-07-15 | Discharge: 2020-07-15 | Disposition: A | Discharge status: Home / Self Care | Payer: 59 | Source: Ambulatory Visit | Attending: Radiation Oncology | Admitting: Radiation Oncology

## 2020-07-15 DIAGNOSIS — C719 Malignant neoplasm of brain, unspecified: Secondary | ICD-10-CM | POA: Diagnosis not present

## 2020-07-16 ENCOUNTER — Other Ambulatory Visit: Payer: Self-pay

## 2020-07-16 ENCOUNTER — Ambulatory Visit
Admission: RE | Admit: 2020-07-16 | Discharge: 2020-07-16 | Disposition: A | Discharge status: Home / Self Care | Payer: 59 | Source: Ambulatory Visit | Attending: Radiation Oncology | Admitting: Radiation Oncology

## 2020-07-16 DIAGNOSIS — C719 Malignant neoplasm of brain, unspecified: Secondary | ICD-10-CM | POA: Diagnosis not present

## 2020-07-19 ENCOUNTER — Telehealth: Payer: Self-pay | Admitting: *Deleted

## 2020-07-19 ENCOUNTER — Ambulatory Visit
Admission: RE | Admit: 2020-07-19 | Discharge: 2020-07-19 | Disposition: A | Discharge status: Home / Self Care | Payer: 59 | Source: Ambulatory Visit | Attending: Radiation Oncology | Admitting: Radiation Oncology

## 2020-07-19 ENCOUNTER — Encounter: Payer: Self-pay | Admitting: Radiation Oncology

## 2020-07-19 DIAGNOSIS — C719 Malignant neoplasm of brain, unspecified: Secondary | ICD-10-CM | POA: Diagnosis not present

## 2020-07-19 NOTE — Telephone Encounter (Signed)
Returned PC to patient's mother, Vaughan Basta, she left VM that patient is positive for covid & has radiation & chemo appointments today.  Informed her that per Dr. Mickeal Skinner, Mitzi Hansen does not need to complete today's treatments & that they should keep his F/U appointment with him in July.  Patient's mother Sandy Salaam understanding.

## 2020-07-22 ENCOUNTER — Encounter: Payer: Self-pay | Admitting: Internal Medicine

## 2020-07-22 ENCOUNTER — Other Ambulatory Visit: Payer: Self-pay | Admitting: Radiation Therapy

## 2020-07-22 NOTE — Progress Notes (Signed)
                                                                                                                                                             Patient Name: Gordon [name] MRN: [id number] DOB: [birth year] Referring Physician: Emelda Brothers Date of Service: 07/19/2020 [facility] [place] [place]                                                        End Of Treatment Note  Diagnoses: C71.1-Malignant neoplasm of frontal lobe  Cancer Staging: left frontal  grade 4 astrocytoma   Intent: Curative  Radiation Treatment Dates: 06/07/2020 through 07/19/2020 Site Technique Total Dose (Gy) Dose per Fx (Gy) Completed Fx Beam Energies  Brain: Brain IMRT 46/46 2 23/23 6X  Brain: Brain_Bst IMRT 14/14 2 7/7 6X   Narrative: The patient tolerated radiation therapy relatively well.    Plan: The patient will receive a call in about one month from the radiation oncology department. He will continue follow up with Dr. Mickeal Skinner as well.   ________________________________________________    Carola Rhine, [facility]

## 2020-07-30 ENCOUNTER — Ambulatory Visit (HOSPITAL_COMMUNITY)
Admission: RE | Admit: 2020-07-30 | Discharge: 2020-07-30 | Disposition: A | Discharge status: Home / Self Care | Payer: 59 | Source: Ambulatory Visit | Attending: Internal Medicine | Admitting: Internal Medicine

## 2020-07-30 ENCOUNTER — Other Ambulatory Visit: Payer: Self-pay

## 2020-07-30 DIAGNOSIS — C719 Malignant neoplasm of brain, unspecified: Secondary | ICD-10-CM | POA: Diagnosis present

## 2020-07-30 MED ORDER — GADOBUTROL 1 MMOL/ML IV SOLN
9.0000 mL | Freq: Once | INTRAVENOUS | Status: AC | PRN
  Administered 2020-07-30: 9 mL via INTRAVENOUS

## 2020-08-02 ENCOUNTER — Inpatient Hospital Stay: Payer: 59 | Attending: Internal Medicine

## 2020-08-03 ENCOUNTER — Other Ambulatory Visit: Payer: Self-pay | Admitting: Internal Medicine

## 2020-08-03 ENCOUNTER — Other Ambulatory Visit (HOSPITAL_COMMUNITY): Payer: Self-pay

## 2020-08-03 ENCOUNTER — Telehealth: Payer: Self-pay | Admitting: Pharmacist

## 2020-08-03 DIAGNOSIS — C719 Malignant neoplasm of brain, unspecified: Secondary | ICD-10-CM

## 2020-08-03 MED ORDER — TEMOZOLOMIDE 140 MG PO CAPS: 140.0000 mg | ORAL_CAPSULE | Freq: Every day | ORAL | 0 refills | Status: DC

## 2020-08-03 MED ORDER — TEMOZOLOMIDE 100 MG PO CAPS
200.0000 mg | ORAL_CAPSULE | Freq: Every day | ORAL | 0 refills | Status: DC
  Filled 2020-08-03: qty 10, 5d supply, fill #0

## 2020-08-03 MED ORDER — TEMOZOLOMIDE 100 MG PO CAPS
200.0000 mg | ORAL_CAPSULE | Freq: Every day | ORAL | 0 refills | Status: DC
Start: 2020-08-03 — End: 2020-10-04

## 2020-08-03 MED ORDER — TEMOZOLOMIDE 140 MG PO CAPS
140.0000 mg | ORAL_CAPSULE | Freq: Every day | ORAL | 0 refills | Status: DC
  Filled 2020-08-03: qty 14, 14d supply, fill #0

## 2020-08-03 MED ORDER — ONDANSETRON HCL 8 MG PO TABS
8.0000 mg | ORAL_TABLET | Freq: Two times a day (BID) | ORAL | 1 refills | Status: DC | PRN
  Filled 2020-08-03: qty 30, 15d supply, fill #0

## 2020-08-03 NOTE — Telephone Encounter (Signed)
Oral Oncology Pharmacist Encounter  Received new prescription for Temodar (temozolomide) for the maintenance phase treatment of grade IV astrocytoma, planned duration 6-12 months.  Prescription dose and frequency assessed for appropriateness. Appropriate for therapy initiation.   CMP and CBC w/ Diff from 07/13/20 assessed, labs stable.  Current medication list in Epic reviewed, no relevant/significant DDIs with Temodar identified.  Evaluated chart and no patient barriers to medication adherence noted.   Patient's insurance requires Temodar be filled through [employer] Prescription redirected for dispensing.   Oral Oncology Clinic will continue to follow for insurance authorization, copayment issues, initial counseling and start date.  Leron Croak, PharmD, BCPS Hematology/Oncology Clinical Pharmacist [facility] [phone] 08/03/2020 2:59 PM

## 2020-08-04 ENCOUNTER — Other Ambulatory Visit (HOSPITAL_COMMUNITY): Payer: Self-pay

## 2020-08-04 ENCOUNTER — Inpatient Hospital Stay: Payer: 59 | Admitting: Internal Medicine

## 2020-08-04 MED ORDER — ONDANSETRON HCL 8 MG PO TABS: 8.0000 mg | ORAL_TABLET | Freq: Two times a day (BID) | ORAL | 1 refills | Status: DC | PRN

## 2020-08-04 NOTE — Telephone Encounter (Signed)
Oral Chemotherapy Pharmacist Encounter  I spoke with patient's mother, Dorien Chihuahua, for overview of: Temodar (temozolomide) for the maintenance treatment of grade IV astrocytoma, planned duration 6-12 months of treatment.  Counseled on administration, dosing, side effects, monitoring, drug-food interactions, safe handling, storage, and disposal.  Patient will take Temodar 100mg  capsules and Temodar 140mg  capsules, 340mg  total daily dose, by mouth once daily, may take at bedtime and on an empty stomach to decrease nausea and vomiting.  If 1st cycle is well tolerated, family informed that Temodar dose may be increased to 200 mg/m2 daily for 5 days on, 23 days off, repeated every 28 days for subsequent cycles   Patient will take Temodar daily for 5 days on, 23 days off, and repeated.  Temodar start date: 08/10/20   Patient will take Zofran 8mg  tablet, 1 tablet by mouth 30-60 min prior to Temodar dose to help decrease N/V. Request was made that prescription be sent to their [facility] in [place] instead of the [facility] outpatient pharmacy. Zofran prescription redirected.    Adverse effects include but are not limited to: nausea, vomiting, anorexia, GI upset, rash, drug fever, and fatigue. Rare but serious adverse effects of pneumocystis pneumonia and secondary malignancy also discussed.  We discussed strategies to manage constipation if they occur secondary to ondansetron dosing.  PCP prophylaxis will not be initiated at this time, but may be added based on lymphocyte count in the future.  Reviewed importance of keeping a medication schedule and plan for any missed doses. No barriers to medication adherence identified.  Medication reconciliation performed and medication/allergy list updated.  Insurance authorization for Temodar has been obtained. Temodar has been delivered to patient's home on 08/04/20  All questions answered.  Ms. Laurance Flatten voiced understanding and appreciation.   Medication  education handout placed in mail. Patient and family know to call the office with questions or concerns. Oral Chemotherapy Clinic phone number provided.   Leron Croak, PharmD, BCPS Hematology/Oncology Clinical Pharmacist [facility] [phone] 08/04/2020 1:57 PM

## 2020-08-05 ENCOUNTER — Telehealth: Payer: Self-pay | Admitting: Internal Medicine

## 2020-08-05 NOTE — Telephone Encounter (Signed)
Scheduled appointment per 07/14 sch msg. Left message. 

## 2020-08-19 NOTE — Progress Notes (Signed)
  Radiation Oncology         [phone]) [phone] ________________________________  Name: Gordon [name] MRN: [id number]  Date of Service: 08/30/2020  DOB: [birth year]  Post Treatment Telephone Note  Diagnosis:   Left frontal  grade 4 astrocytoma   Interval Since Last Radiation:  7 weeks   06/07/2020 through 07/19/2020 Site Technique Total Dose (Gy) Dose per Fx (Gy) Completed Fx Beam Energies  Brain: Brain IMRT 46/46 2 23/23 6X  Brain: Brain_Bst IMRT 14/14 2 7/7 6X    Narrative:  The patient was contacted today for routine follow-up. During treatment he did very well with radiotherapy and did not have significant desquamation. His mother answered and reports he is tolerating this well.  Impression/Plan: 1. Left frontal  grade 4 astrocytoma . The patient has been doing well since completion of radiotherapy. We discussed that we would be happy to continue to follow him as needed, but he will also continue to follow up with Dr. Mickeal Skinner in neuro oncology.      Carola Rhine, PAC

## 2020-08-23 ENCOUNTER — Other Ambulatory Visit: Payer: Self-pay

## 2020-08-23 MED ORDER — MELOXICAM 15 MG PO TABS: 15.0000 mg | ORAL_TABLET | Freq: Every day | ORAL | 0 refills | Status: DC | PRN

## 2020-08-23 MED ORDER — METHOCARBAMOL 500 MG PO TABS: 500.0000 mg | ORAL_TABLET | Freq: Three times a day (TID) | ORAL | 0 refills | Status: DC | PRN

## 2020-08-23 NOTE — Progress Notes (Signed)
Pt's mom called stating that her son's back pain flared up this weekend and he is in a lot of pain. She was hoping Dr. Barbaraann Barthel would refill his Methocarbamol and Meloxicam as that has helped in the past.  Will send in 1 refill. If he needs additional refills we will need to see him in the office for evaluation. Pt's mom understands.  Rx sent to pharmacy.

## 2020-08-24 ENCOUNTER — Other Ambulatory Visit: Payer: Self-pay | Admitting: Internal Medicine

## 2020-08-24 DIAGNOSIS — C719 Malignant neoplasm of brain, unspecified: Secondary | ICD-10-CM

## 2020-08-24 NOTE — Telephone Encounter (Signed)
Patient to have labs and see MD prior to refilling Temozolomide

## 2020-08-25 ENCOUNTER — Other Ambulatory Visit: Payer: Self-pay

## 2020-08-25 ENCOUNTER — Ambulatory Visit (INDEPENDENT_AMBULATORY_CARE_PROVIDER_SITE_OTHER): Payer: 59 | Admitting: Internal Medicine

## 2020-08-25 VITALS — BP 128/77 | HR 71 | Temp 98.2°F | Wt 210.0 lb

## 2020-08-25 DIAGNOSIS — T847XXD Infection and inflammatory reaction due to other internal orthopedic prosthetic devices, implants and grafts, subsequent encounter: Secondary | ICD-10-CM

## 2020-08-25 DIAGNOSIS — M869 Osteomyelitis, unspecified: Secondary | ICD-10-CM | POA: Diagnosis not present

## 2020-08-25 MED ORDER — DOXYCYCLINE HYCLATE 100 MG PO TABS: 100.0000 mg | ORAL_TABLET | Freq: Two times a day (BID) | ORAL | 2 refills | Status: DC

## 2020-08-25 NOTE — Progress Notes (Signed)
[facility] for Infectious Disease  Patient Active Problem List   Diagnosis Date Noted   Hardware complicating wound infection ([place])    Wound infection 05/16/2020   Wound infection after surgery 04/19/2020   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[place]) 04/04/2020   Brain mass 04/02/2020   Seizure ([place]) 04/02/2020   Left knee injury, subsequent encounter 06/13/2017   Low back pain 03/25/2014      Subjective:    Patient ID: Gordon [name], male    DOB: [birth year], 25 y.o.   MRN: [id number]  Chief Complaint  Patient presents with   Follow-up    Osteomyelitis, unspecified site, unspecified type    HPI:  Gordon [name] is a 25 y.o. 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 cx the second time MRSE and mssa but no p. Acnes  He is doing well on mino/rifampin No seizure. Just taking keppra for seizure  Chemo tx of tumor planned soon once infection controlled and wound healed.   07/01/2020 id clinic f/u Patient had been continued on chemo for his brain cancer Doing well so far No issue with surgical site/titanium mesh wound infection No n/v/diarrhea No joint pain  No pigmentation   08/25/2020 id clinic f/u Patient is s/p 3 months of rifampin/minocycline for surgical site related mrse/mssa hardware related OM of the craniotomy site Previous crp improving Tolerating abx without n/v/diarrhea/rash/myalgia/arthralgia Chemotherapy going well; will be on that for the next year, 28 day cycle. No end date on duration except 1 year of these 28 day cycles.   No Known Allergies    Outpatient Medications Prior to Visit  Medication Sig Dispense Refill   levETIRAcetam (KEPPRA) 750 MG tablet Take 1 tablet (750 mg total) by mouth 2 (two) times daily. 60 tablet 5   meloxicam (MOBIC) 15 MG tablet  Take 1 tablet (15 mg total) by mouth daily as needed for pain. 30 tablet 0   methocarbamol (ROBAXIN) 500 MG tablet Take 1 tablet (500 mg total) by mouth every 8 (eight) hours as needed for muscle spasms. 60 tablet 0   minocycline (MINOCIN) 100 MG capsule Take 1 capsule (100 mg total) by mouth 2 (two) times daily. 60 capsule 2   Multiple Vitamin (MULTIVITAMIN PO) Take 1 capsule by mouth daily.     Omega-3 Fatty Acids (OMEGA 3 PO) Take 1 capsule by mouth daily.     ondansetron (ZOFRAN) 8 MG tablet Take 1 tablet (8 mg total) by mouth 2 (two) times daily as needed (nausea and vomiting). May take 30-60 minutes prior to Temodar administration if nausea/vomiting occurs. 30 tablet 1   Probiotic Product (PROBIOTIC PO) Take 1 capsule by mouth daily.     rifampin (RIFADIN) 300 MG capsule Take 300 mg by mouth 2 (two) times daily.     temozolomide (TEMODAR) 100 MG capsule Take 2 capsules (200 mg total) by mouth daily. May take on an empty stomach to decrease nausea & vomiting. 10 capsule 0   temozolomide (TEMODAR) 140 MG capsule Take 1 capsule (140 mg total) by mouth daily. May take on an empty stomach to decrease nausea & vomiting. 5 capsule 0   No facility-administered medications prior to visit.     Social History   Socioeconomic History   Marital status: Single    Spouse name:  Not on file   Number of children: 0   Years of education: Not on file   Highest education level: Not on file  Occupational History   Not on file  Tobacco Use   Smoking status: Never   Smokeless tobacco: Never  Vaping Use   Vaping Use: Never used  Substance and Sexual Activity   Alcohol use: No    Alcohol/week: 0.0 standard drinks   Drug use: No   Sexual activity: Yes    Birth control/protection: Condom  Other Topics Concern   Not on file  Social History Narrative   Not on file   Social Determinants of Health   Financial Resource Strain: Not on file  Food Insecurity: Not on file  Transportation Needs: Not on  file  Physical Activity: Not on file  Stress: Not on file  Social Connections: Not on file  Intimate Partner Violence: Not on file      Review of Systems    All other ros negative  Objective:    There were no vitals taken for this visit. Nursing note and vital signs reviewed.  Physical Exam  General/constitutional: no distress, pleasant HEENT: Normocephalic, PER, Conj Clear, EOMI, Oropharynx clear Neck supple CV: rrr no mrg Lungs: clear to auscultation, normal respiratory effort Abd: Soft, Nontender Ext: no edema Skin: No Rash Neuro: nonfocal; craniotomy site healed no fluctuance/swelling/tenderness MSK: no peripheral joint swelling/tenderness/warmth; back spines nontender         Labs: Lab Results  Component Value Date   WBC 5.0 07/13/2020   HGB 14.6 07/13/2020   HCT 43.1 07/13/2020   MCV 87.2 07/13/2020   PLT 234 23/53/6144   Last metabolic panel Lab Results  Component Value Date   GLUCOSE 90 07/13/2020   NA 137 07/13/2020   K 4.7 07/13/2020   CL 104 07/13/2020   CO2 28 07/13/2020   BUN 13 07/13/2020   CREATININE 0.91 07/13/2020   GFRNONAA >60 07/13/2020   GFRAA  08/10/2009    NOT CALCULATED        The eGFR has been calculated using the MDRD equation. This calculation has not been validated in all clinical situations. eGFR's persistently <60 mL/min signify possible Chronic Kidney Disease.   CALCIUM 9.7 07/13/2020   PROT 7.7 07/13/2020   ALBUMIN 4.6 07/13/2020   BILITOT 0.6 07/13/2020   ALKPHOS 67 07/13/2020   AST 22 07/13/2020   ALT 59 (H) 07/13/2020   ANIONGAP 5 07/13/2020   Crp: 5/11  1.2 4/23  5.1  Micro: 4/25 OR debridement cx Mrse (S bactrim, tetra) Mssa (S bactrim, tetra)  Serology:  Imaging: 4/23 mri brain wwo contrast IMPRESSION: 1. Status post left frontal tumor resection with decreased blood products at the resection site. Nonspecific contrast enhancement surrounding the resection cavity may be postsurgical. 2.  Decreased size of scalp fluid collection overlying the craniotomy site. No specific features of infection. 3. Dural thickening underlying the craniotomy, likely reactive.      Assessment & Plan:   Problem List Items Addressed This Visit   None  Abx: 4/26-c mino/rifampin (300 rif bid)  25 yo male hx brain tumore s/p excision 3/13 with placement of titanium plate, complicated by post op wound infection s/o I&D 3/29. Initial cx mssa and p-acnes s/p short course of abx, but readmitted for ongoing infection s/p repeat I&D 4/25 with placement of titanium mesh placement  Continue to do well Tolerating antibiotics Labs appear without abx toxicity. Crp not checked, will need to trend  Abx  planned 3 months mino/rifampin until end of 07/2019, then indefinitely with doxycycline or until mesh can be removed  08/25/20 id f/u Doing well Infection appears well controlled We could stop rifampin today and continue him on indefinite tetracycyline, can plan to switch mino to doxy Patient is going to be on Temodar chemo --> no DDI on check 08/25/2020 for doxycycline  -stop rifampin and minocycline -start doxycycline indefinitely 100 mg po bid -cbc, cmp, crp today -f/u 6-8 weeks  I have spent a total of 20 minutes of face-to-face and non-face-to-face time, excluding clinical staff time, preparing to see patient, ordering tests and/or medications, and provide counseling the patient     Follow-up: Return in about 3 months (around 11/25/2020).      Jabier Mutton, [facility] for [facility] [phone]  pager   [phone] cell 08/25/2020, 2:24 PM

## 2020-08-25 NOTE — Patient Instructions (Signed)
Let's stop minocycline   Start doxycycline 100 mg twice day  Sun screen  Avoid dairy/calcium/magnesium/iron within 2 hours taking the medication   Labs test today   F/u 10-12 weeks

## 2020-08-26 ENCOUNTER — Encounter: Payer: Self-pay | Admitting: Internal Medicine

## 2020-08-26 ENCOUNTER — Ambulatory Visit: Payer: 59 | Admitting: Internal Medicine

## 2020-08-26 LAB — COMPLETE METABOLIC PANEL WITH GFR
AG Ratio: 1.8 (calc) (ref 1.0–2.5)
ALT: 23 U/L (ref 9–46)
AST: 15 U/L (ref 10–40)
Albumin: 4.7 g/dL (ref 3.6–5.1)
Alkaline phosphatase (APISO): 52 U/L (ref 36–130)
BUN: 14 mg/dL (ref 7–25)
CO2: 27 mmol/L (ref 20–32)
Calcium: 9.8 mg/dL (ref 8.6–10.3)
Chloride: 103 mmol/L (ref 98–110)
Creat: 1.06 mg/dL (ref 0.60–1.24)
Globulin: 2.6 g/dL (calc) (ref 1.9–3.7)
Glucose, Bld: 77 mg/dL (ref 65–99)
Potassium: 4.5 mmol/L (ref 3.5–5.3)
Sodium: 139 mmol/L (ref 135–146)
Total Bilirubin: 0.4 mg/dL (ref 0.2–1.2)
Total Protein: 7.3 g/dL (ref 6.1–8.1)
eGFR: 101 mL/min/{1.73_m2} (ref >=60)

## 2020-08-26 LAB — CBC WITH DIFFERENTIAL/PLATELET
Absolute Monocytes: 462 cells/uL (ref 200–950)
Basophils Absolute: 23 cells/uL (ref 0–200)
Basophils Relative: 0.4 %
Eosinophils Absolute: 63 cells/uL (ref 15–500)
Eosinophils Relative: 1.1 %
HCT: 42.7 % (ref 38.5–50.0)
Hemoglobin: 14.3 g/dL (ref 13.2–17.1)
Lymphs Abs: 1186 cells/uL (ref 850–3900)
MCH: 29.4 pg (ref 27.0–33.0)
MCHC: 33.5 g/dL (ref 32.0–36.0)
MCV: 87.7 fL (ref 80.0–100.0)
MPV: 9.9 fL (ref 7.5–12.5)
Monocytes Relative: 8.1 %
Neutro Abs: 3967 cells/uL (ref 1500–7800)
Neutrophils Relative %: 69.6 %
Platelets: 224 10*3/uL (ref 140–400)
RBC: 4.87 10*6/uL (ref 4.20–5.80)
RDW: 13.9 % (ref 11.0–15.0)
Total Lymphocyte: 20.8 %
WBC: 5.7 10*3/uL (ref 3.8–10.8)

## 2020-08-26 LAB — C-REACTIVE PROTEIN: CRP: 0.9 mg/L (ref <8.0)

## 2020-08-30 ENCOUNTER — Inpatient Hospital Stay: Payer: 59 | Attending: Internal Medicine

## 2020-08-30 ENCOUNTER — Telehealth: Payer: Self-pay | Admitting: *Deleted

## 2020-08-30 ENCOUNTER — Other Ambulatory Visit: Payer: Self-pay

## 2020-08-30 ENCOUNTER — Ambulatory Visit
Admission: RE | Admit: 2020-08-30 | Discharge: 2020-08-30 | Disposition: A | Discharge status: Home / Self Care | Payer: 59 | Source: Ambulatory Visit | Attending: Radiation Oncology | Admitting: Radiation Oncology

## 2020-08-30 DIAGNOSIS — C719 Malignant neoplasm of brain, unspecified: Secondary | ICD-10-CM | POA: Insufficient documentation

## 2020-08-30 DIAGNOSIS — C711 Malignant neoplasm of frontal lobe: Secondary | ICD-10-CM | POA: Diagnosis not present

## 2020-08-30 LAB — CBC WITH DIFFERENTIAL (CANCER CENTER ONLY)
Abs Immature Granulocytes: 0 10*3/uL (ref 0.00–0.07)
Basophils Absolute: 0 10*3/uL (ref 0.0–0.1)
Basophils Relative: 1 %
Eosinophils Absolute: 0.1 10*3/uL (ref 0.0–0.5)
Eosinophils Relative: 2 %
HCT: 39.3 % (ref 39.0–52.0)
Hemoglobin: 13.9 g/dL (ref 13.0–17.0)
Immature Granulocytes: 0 %
Lymphocytes Relative: 28 %
Lymphs Abs: 1.2 10*3/uL (ref 0.7–4.0)
MCH: 30.2 pg (ref 26.0–34.0)
MCHC: 35.4 g/dL (ref 30.0–36.0)
MCV: 85.2 fL (ref 80.0–100.0)
Monocytes Absolute: 0.4 10*3/uL (ref 0.1–1.0)
Monocytes Relative: 11 %
Neutro Abs: 2.5 10*3/uL (ref 1.7–7.7)
Neutrophils Relative %: 58 %
Platelet Count: 208 10*3/uL (ref 150–400)
RBC: 4.61 MIL/uL (ref 4.22–5.81)
RDW: 13 % (ref 11.5–15.5)
WBC Count: 4.2 10*3/uL (ref 4.0–10.5)
nRBC: 0 % (ref 0.0–0.2)

## 2020-08-30 LAB — CMP (CANCER CENTER ONLY)
ALT: 23 U/L (ref 0–44)
AST: 16 U/L (ref 15–41)
Albumin: 4.3 g/dL (ref 3.5–5.0)
Alkaline Phosphatase: 51 U/L (ref 38–126)
Anion gap: 7 (ref 5–15)
BUN: 14 mg/dL (ref 6–20)
CO2: 27 mmol/L (ref 22–32)
Calcium: 10.2 mg/dL (ref 8.9–10.3)
Chloride: 105 mmol/L (ref 98–111)
Creatinine: 0.93 mg/dL (ref 0.61–1.24)
GFR, Estimated: >60 mL/min (ref >60)
Glucose, Bld: 85 mg/dL (ref 70–99)
Potassium: 4.3 mmol/L (ref 3.5–5.1)
Sodium: 139 mmol/L (ref 135–145)
Total Bilirubin: 0.5 mg/dL (ref 0.3–1.2)
Total Protein: 7.1 g/dL (ref 6.5–8.1)

## 2020-08-30 NOTE — Telephone Encounter (Signed)
Patients mother called to see if Dr Mickeal Skinner would go ahead and order patients Temodar before his visit with him on 08/15 as it takes some time to get the medication and he is due to start on 08/16.    Patient had labs done today and all are WNL.  Routed to Dr Mickeal Skinner to see if he would reorder.

## 2020-09-02 ENCOUNTER — Encounter: Payer: Self-pay | Admitting: Internal Medicine

## 2020-09-02 ENCOUNTER — Other Ambulatory Visit: Payer: Self-pay | Admitting: Internal Medicine

## 2020-09-02 DIAGNOSIS — C719 Malignant neoplasm of brain, unspecified: Secondary | ICD-10-CM

## 2020-09-02 MED ORDER — TEMOZOLOMIDE 140 MG PO CAPS: 200.0000 mg/m2/d | ORAL_CAPSULE | Freq: Every day | ORAL | 0 refills | Status: DC

## 2020-09-06 ENCOUNTER — Telehealth: Payer: Self-pay | Admitting: Nutrition

## 2020-09-06 ENCOUNTER — Inpatient Hospital Stay (HOSPITAL_BASED_OUTPATIENT_CLINIC_OR_DEPARTMENT_OTHER): Payer: 59 | Admitting: Internal Medicine

## 2020-09-06 ENCOUNTER — Inpatient Hospital Stay: Payer: 59

## 2020-09-06 ENCOUNTER — Other Ambulatory Visit: Payer: Self-pay

## 2020-09-06 VITALS — BP 138/68 | HR 72 | Temp 98.3°F | Resp 17 | Wt 211.9 lb

## 2020-09-06 DIAGNOSIS — C719 Malignant neoplasm of brain, unspecified: Secondary | ICD-10-CM

## 2020-09-06 DIAGNOSIS — R569 Unspecified convulsions: Secondary | ICD-10-CM | POA: Diagnosis not present

## 2020-09-06 DIAGNOSIS — C711 Malignant neoplasm of frontal lobe: Secondary | ICD-10-CM | POA: Diagnosis not present

## 2020-09-06 LAB — CMP (CANCER CENTER ONLY)
ALT: 18 U/L (ref 0–44)
AST: 16 U/L (ref 15–41)
Albumin: 4.3 g/dL (ref 3.5–5.0)
Alkaline Phosphatase: 50 U/L (ref 38–126)
Anion gap: 8 (ref 5–15)
BUN: 11 mg/dL (ref 6–20)
CO2: 27 mmol/L (ref 22–32)
Calcium: 9.5 mg/dL (ref 8.9–10.3)
Chloride: 104 mmol/L (ref 98–111)
Creatinine: 1.01 mg/dL (ref 0.61–1.24)
GFR, Estimated: >60 mL/min (ref >60)
Glucose, Bld: 77 mg/dL (ref 70–99)
Potassium: 4.3 mmol/L (ref 3.5–5.1)
Sodium: 139 mmol/L (ref 135–145)
Total Bilirubin: 0.6 mg/dL (ref 0.3–1.2)
Total Protein: 7.1 g/dL (ref 6.5–8.1)

## 2020-09-06 LAB — CBC WITH DIFFERENTIAL (CANCER CENTER ONLY)
Abs Immature Granulocytes: 0.01 10*3/uL (ref 0.00–0.07)
Basophils Absolute: 0 10*3/uL (ref 0.0–0.1)
Basophils Relative: 1 %
Eosinophils Absolute: 0.1 10*3/uL (ref 0.0–0.5)
Eosinophils Relative: 1 %
HCT: 40.4 % (ref 39.0–52.0)
Hemoglobin: 14.1 g/dL (ref 13.0–17.0)
Immature Granulocytes: 0 %
Lymphocytes Relative: 28 %
Lymphs Abs: 1.5 10*3/uL (ref 0.7–4.0)
MCH: 30.1 pg (ref 26.0–34.0)
MCHC: 34.9 g/dL (ref 30.0–36.0)
MCV: 86.1 fL (ref 80.0–100.0)
Monocytes Absolute: 0.6 10*3/uL (ref 0.1–1.0)
Monocytes Relative: 10 %
Neutro Abs: 3.2 10*3/uL (ref 1.7–7.7)
Neutrophils Relative %: 60 %
Platelet Count: 205 10*3/uL (ref 150–400)
RBC: 4.69 MIL/uL (ref 4.22–5.81)
RDW: 12.8 % (ref 11.5–15.5)
WBC Count: 5.3 10*3/uL (ref 4.0–10.5)
nRBC: 0 % (ref 0.0–0.2)

## 2020-09-06 NOTE — Telephone Encounter (Signed)
Patient's mother Vaughan Basta requested information on someone who could prepare meals for patient.  I contacted her however she was unavailable.  Left a message that I was unaware of anyone who  prepares meals for patients at this time.  She is in contact with Education officer, museum for additional resources.

## 2020-09-06 NOTE — Progress Notes (Signed)
[facility] [place], [place] [address] [phone]   Interval Evaluation  Date of Service: 09/06/20 Patient Name: Gordon [name] Patient MRN: [id number] Patient DOB: 05-28-1995 Provider: Ventura Sellers, MD  Identifying Statement:  Gordon [name] is a 25 y.o. 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; path is grade 4 astrocytoma, IDH-1 mutant   04/20/2020 Surgery   [place] of surgical site collection; speciates MSSA   05/17/2020 Surgery   Repeat wound exploration, irrigation.   06/07/2020 -  Radiation Therapy   IMRT and concurrent Temodar 50m/m2   08/09/2020 -  Chemotherapy    Patient is on Treatment Plan: BRAIN GLIOBLASTOMA CONSOLIDATION TEMOZOLOMIDE DAYS 1-5 Q28 DAYS          Biomarkers:  MGMT Unknown.  IDH 1/2 Mutated.  EGFR Unknown  TERT Unknown   Interval History:  Gordon Gilardipresents to clinic for follow up today, now having completed first cycle of adjuvant Temodar.  No issues tolerating chemo, has also completed his antibiotic course for hardware infection.  Staying active but not driving until 6 months s/p seizure next month.  Working bCatering manager  H+P (04/12/20) Patient presented to medical attention three weeks prior with episode of loss of awareness and generalized shaking consistent with seizure episode.  CNS imaging demonstrated an enhancing left frontal mass, which was resected on 04/04/20 by [name]  He tolerated surgery well, has no significant complaints at this time.  Last day of decadron taper is today.  He has been more or less independent at home, starting to resume some of his studies at school.  Currently a business student at [employer]  No other significant medical  history.  Medications: Current Outpatient Medications on File Prior to Visit  Medication Sig Dispense Refill   doxycycline (VIBRA-TABS) 100 MG tablet Take 1 tablet (100 mg total) by mouth 2 (two) times daily. 180 tablet 2   levETIRAcetam (KEPPRA) 750 MG tablet Take 1 tablet (750 mg total) by mouth 2 (two) times daily. 60 tablet 5   Multiple Vitamin (MULTIVITAMIN PO) Take 1 capsule by mouth daily.     Omega-3 Fatty Acids (OMEGA 3 PO) Take 1 capsule by mouth daily.     ondansetron (ZOFRAN) 8 MG tablet Take 1 tablet (8 mg total) by mouth 2 (two) times daily as needed (nausea and vomiting). May take 30-60 minutes prior to Temodar administration if nausea/vomiting occurs. 30 tablet 1   Probiotic Product (PROBIOTIC PO) Take 1 capsule by mouth daily.     temozolomide (TEMODAR) 100 MG capsule Take 2 capsules (200 mg total) by mouth daily. May take on an empty stomach to decrease nausea & vomiting. 10 capsule 0   temozolomide (TEMODAR) 140 MG capsule Take 1 capsule (140 mg total) by mouth daily. May take on an empty stomach to decrease nausea & vomiting. 5 capsule 0   temozolomide (TEMODAR) 140 MG capsule Take 3 capsules (420 mg total) by mouth daily. May take on an empty stomach to decrease nausea & vomiting. 15 capsule 0   meloxicam (MOBIC) 15 MG tablet Take 1 tablet (15 mg total) by mouth daily as needed for pain. 30 tablet 0   methocarbamol (ROBAXIN) 500 MG tablet Take 1 tablet (500 mg total) by mouth every 8 (eight)  hours as needed for muscle spasms. 60 tablet 0   rifampin (RIFADIN) 300 MG capsule Take 300 mg by mouth 2 (two) times daily.     No current facility-administered medications on file prior to visit.    Allergies: No Known Allergies Past Medical History:  Past Medical History:  Diagnosis Date   Bulging lumbar disc    Complication of anesthesia    PONV (postoperative nausea and vomiting)    Vomited x1 after craniotomy surgery   Seizure ([facility]) 04/03/2020   x 1 at home pre-admission.   Otherwise, no history.   Past Surgical History:  Past Surgical History:  Procedure Laterality Date   BURR HOLE Right 04/20/2020   Procedure: Irrigation and Debridement of Cranial Wound;  Surgeon: Ostergard, Thomas A, MD;  Location: [facility] OR;  Service: Neurosurgery;  Laterality: Right;   CRANIOTOMY Left 04/04/2020   Procedure: LEFT CRANIOTOMY FOR TUMOR EXCISION;  Surgeon: Ostergard, Thomas A, MD;  Location: [facility] OR;  Service: Neurosurgery;  Laterality: Left;   TONSILLECTOMY     WISDOM TOOTH EXTRACTION     WOUND EXPLORATION Left 05/17/2020   Procedure: CRANIAL WOUND EXPLORATION WITH PLACEMENT OF MESH;  Surgeon: Ostergard, Thomas A, MD;  Location: [facility] OR;  Service: Neurosurgery;  Laterality: Left;   Social History:  Social History   Socioeconomic History   Marital status: Single    Spouse name: Not on file   Number of children: 0   Years of education: Not on file   Highest education level: Not on file  Occupational History   Not on file  Tobacco Use   Smoking status: Never   Smokeless tobacco: Never  Vaping Use   Vaping Use: Never used  Substance and Sexual Activity   Alcohol use: No    Alcohol/week: 0.0 standard drinks   Drug use: No   Sexual activity: Yes    Birth control/protection: Condom  Other Topics Concern   Not on file  Social History Narrative   Not on file   Social Determinants of Health   Financial Resource Strain: Not on file  Food Insecurity: Not on file  Transportation Needs: Not on file  Physical Activity: Not on file  Stress: Not on file  Social Connections: Not on file  Intimate Partner Violence: Not on file   Family History: No family history on file.  Review of Systems: Constitutional: Doesn't report fevers, chills or abnormal weight loss Eyes: Doesn't report blurriness of vision Ears, nose, mouth, throat, and face: Doesn't report sore throat Respiratory: Doesn't report cough, dyspnea or wheezes Cardiovascular: Doesn't report palpitation, chest  discomfort  Gastrointestinal:  Doesn't report nausea, constipation, diarrhea GU: Doesn't report incontinence Skin: Doesn't report skin rashes Neurological: Per HPI Musculoskeletal: Doesn't report joint pain Behavioral/Psych: Doesn't report anxiety  Physical Exam: Vitals:   09/06/20 1222  BP: 138/68  Pulse: 72  Resp: 17  Temp: 98.3 F (36.8 C)  SpO2: 100%   KPS: 90. General: Alert, cooperative, pleasant, in no acute distress Head: Normal EENT: No conjunctival injection or scleral icterus.  Lungs: Resp effort normal Cardiac: Regular rate Abdomen: Non-distended abdomen Skin: No rashes cyanosis or petechiae. Extremities: No clubbing or edema  Neurologic Exam: Mental Status: Awake, alert, attentive to examiner. Oriented to self and environment. Language is fluent with intact comprehension.  Cranial Nerves: Visual acuity is grossly normal. Visual fields are full. Extra-ocular movements intact. No ptosis. Face is symmetric Motor: Tone and bulk are normal. Power is full in both arms and legs. Reflexes are   symmetric, no pathologic reflexes present.  Sensory: Intact to light touch Gait: Normal.   Labs: I have reviewed the data as listed    Component Value Date/Time   NA 139 08/30/2020 0834   K 4.3 08/30/2020 0834   CL 105 08/30/2020 0834   CO2 27 08/30/2020 0834   GLUCOSE 85 08/30/2020 0834   BUN 14 08/30/2020 0834   CREATININE 0.93 08/30/2020 0834   CREATININE 1.06 08/25/2020 1451   CALCIUM 10.2 08/30/2020 0834   PROT 7.1 08/30/2020 0834   ALBUMIN 4.3 08/30/2020 0834   AST 16 08/30/2020 0834   ALT 23 08/30/2020 0834   ALKPHOS 51 08/30/2020 0834   BILITOT 0.5 08/30/2020 0834   GFRNONAA >60 08/30/2020 0834   GFRAA  08/10/2009 2150    NOT CALCULATED        The eGFR has been calculated using the MDRD equation. This calculation has not been validated in all clinical situations. eGFR's persistently <60 mL/min signify possible Chronic Kidney Disease.   Lab Results   Component Value Date   WBC 5.3 09/06/2020   NEUTROABS 3.2 09/06/2020   HGB 14.1 09/06/2020   HCT 40.4 09/06/2020   MCV 86.1 09/06/2020   PLT 205 09/06/2020     Assessment/Plan 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[place])  Seizure ([place])  Gordon [name] is clinically stable today, now having completed first cycle of adjuvant Temodar.  We recommended continuing treatment with cycle #2 Temozolomide at 200 mg/m2, on for five days and off for twenty three days in twenty eight day cycles. The patient will have a complete blood count performed on days 21 and 28 of each cycle, and a comprehensive metabolic panel performed on day 28 of each cycle. Labs may need to be performed more often. Zofran will prescribed for home use for nausea/vomiting.   Chemotherapy should be held for the following:  ANC less than 1,000  Platelets less than 100,000  LFT or creatinine greater than 2x ULN  If clinical concerns/contraindications develop  He will continue Keppra 731m BID.    AEmauri [name] will obtain MRI brain and follow up with Dr. DImagene Richeson 09/28/20.  We will see him back in clinic on 10/04/20 prior to cycle #3.  All questions were answered. The patient knows to call the clinic with any problems, questions or concerns. No barriers to learning were detected.  The total time spent in the encounter was 30 minutes and more than 50% was on counseling and review of test results   ZVentura Sellers MD Medical Director of Neuro-Oncology [facility]08/15/22 12:46 PM

## 2020-09-14 ENCOUNTER — Other Ambulatory Visit: Payer: Self-pay

## 2020-09-14 ENCOUNTER — Other Ambulatory Visit: Payer: Self-pay | Admitting: Radiation Therapy

## 2020-09-14 ENCOUNTER — Inpatient Hospital Stay: Payer: 59 | Admitting: *Deleted

## 2020-09-14 DIAGNOSIS — C719 Malignant neoplasm of brain, unspecified: Secondary | ICD-10-CM

## 2020-09-14 NOTE — Progress Notes (Signed)
[place] Work  Clinical Social Work met with patient in office for initial counseling session.  CSW and patient discuss common stressors/losses and emotional responses to cancer diagnosis.  CSW reviewed general coping skills.  CSW and patient agreed to follow up appointment to discuss more specific supportive resources for patient.    Gwinda Maine, LCSW  Clinical Social Worker [facility]

## 2020-09-22 ENCOUNTER — Other Ambulatory Visit: Payer: Self-pay | Admitting: Internal Medicine

## 2020-09-22 DIAGNOSIS — C719 Malignant neoplasm of brain, unspecified: Secondary | ICD-10-CM

## 2020-09-23 ENCOUNTER — Ambulatory Visit (HOSPITAL_COMMUNITY)
Admission: RE | Admit: 2020-09-23 | Discharge: 2020-09-23 | Disposition: A | Discharge status: Home / Self Care | Payer: 59 | Source: Ambulatory Visit | Attending: Internal Medicine | Admitting: Internal Medicine

## 2020-09-23 ENCOUNTER — Other Ambulatory Visit: Payer: Self-pay

## 2020-09-23 DIAGNOSIS — C719 Malignant neoplasm of brain, unspecified: Secondary | ICD-10-CM | POA: Insufficient documentation

## 2020-09-23 MED ORDER — GADOBUTROL 1 MMOL/ML IV SOLN
10.0000 mL | Freq: Once | INTRAVENOUS | Status: AC | PRN
  Administered 2020-09-23: 10 mL via INTRAVENOUS

## 2020-09-30 ENCOUNTER — Other Ambulatory Visit: Payer: Self-pay | Admitting: *Deleted

## 2020-09-30 ENCOUNTER — Encounter: Payer: Self-pay | Admitting: Internal Medicine

## 2020-09-30 DIAGNOSIS — C719 Malignant neoplasm of brain, unspecified: Secondary | ICD-10-CM

## 2020-10-04 ENCOUNTER — Inpatient Hospital Stay: Payer: 59 | Attending: Internal Medicine

## 2020-10-04 ENCOUNTER — Other Ambulatory Visit: Payer: Self-pay

## 2020-10-04 ENCOUNTER — Inpatient Hospital Stay: Payer: 59

## 2020-10-04 ENCOUNTER — Inpatient Hospital Stay (HOSPITAL_BASED_OUTPATIENT_CLINIC_OR_DEPARTMENT_OTHER): Payer: 59 | Admitting: Internal Medicine

## 2020-10-04 ENCOUNTER — Inpatient Hospital Stay: Payer: 59 | Admitting: *Deleted

## 2020-10-04 VITALS — BP 120/64 | HR 79 | Temp 98.2°F | Resp 17 | Ht 75.0 in | Wt 212.7 lb

## 2020-10-04 DIAGNOSIS — C711 Malignant neoplasm of frontal lobe: Secondary | ICD-10-CM | POA: Insufficient documentation

## 2020-10-04 DIAGNOSIS — R569 Unspecified convulsions: Secondary | ICD-10-CM | POA: Diagnosis not present

## 2020-10-04 DIAGNOSIS — C719 Malignant neoplasm of brain, unspecified: Secondary | ICD-10-CM | POA: Diagnosis not present

## 2020-10-04 DIAGNOSIS — Z79899 Other long term (current) drug therapy: Secondary | ICD-10-CM | POA: Diagnosis not present

## 2020-10-04 DIAGNOSIS — Z923 Personal history of irradiation: Secondary | ICD-10-CM | POA: Diagnosis not present

## 2020-10-04 DIAGNOSIS — Z9221 Personal history of antineoplastic chemotherapy: Secondary | ICD-10-CM | POA: Insufficient documentation

## 2020-10-04 LAB — CBC WITH DIFFERENTIAL (CANCER CENTER ONLY)
Abs Immature Granulocytes: 0.01 10*3/uL (ref 0.00–0.07)
Basophils Absolute: 0 10*3/uL (ref 0.0–0.1)
Basophils Relative: 1 %
Eosinophils Absolute: 0.1 10*3/uL (ref 0.0–0.5)
Eosinophils Relative: 2 %
HCT: 38.8 % — ABNORMAL LOW (ref 39.0–52.0)
Hemoglobin: 13.5 g/dL (ref 13.0–17.0)
Immature Granulocytes: 0 %
Lymphocytes Relative: 25 %
Lymphs Abs: 1.2 10*3/uL (ref 0.7–4.0)
MCH: 30.5 pg (ref 26.0–34.0)
MCHC: 34.8 g/dL (ref 30.0–36.0)
MCV: 87.8 fL (ref 80.0–100.0)
Monocytes Absolute: 0.5 10*3/uL (ref 0.1–1.0)
Monocytes Relative: 11 %
Neutro Abs: 2.9 10*3/uL (ref 1.7–7.7)
Neutrophils Relative %: 61 %
Platelet Count: 155 10*3/uL (ref 150–400)
RBC: 4.42 MIL/uL (ref 4.22–5.81)
RDW: 12.8 % (ref 11.5–15.5)
WBC Count: 4.8 10*3/uL (ref 4.0–10.5)
nRBC: 0 % (ref 0.0–0.2)

## 2020-10-04 LAB — CMP (CANCER CENTER ONLY)
ALT: 14 U/L (ref 0–44)
AST: 13 U/L — ABNORMAL LOW (ref 15–41)
Albumin: 4.4 g/dL (ref 3.5–5.0)
Alkaline Phosphatase: 58 U/L (ref 38–126)
Anion gap: 7 (ref 5–15)
BUN: 13 mg/dL (ref 6–20)
CO2: 27 mmol/L (ref 22–32)
Calcium: 9.7 mg/dL (ref 8.9–10.3)
Chloride: 107 mmol/L (ref 98–111)
Creatinine: 0.88 mg/dL (ref 0.61–1.24)
GFR, Estimated: >60 mL/min (ref >60)
Glucose, Bld: 74 mg/dL (ref 70–99)
Potassium: 4.4 mmol/L (ref 3.5–5.1)
Sodium: 141 mmol/L (ref 135–145)
Total Bilirubin: 0.6 mg/dL (ref 0.3–1.2)
Total Protein: 7.2 g/dL (ref 6.5–8.1)

## 2020-10-04 MED ORDER — TEMOZOLOMIDE 140 MG PO CAPS: 200.0000 mg/m2/d | ORAL_CAPSULE | Freq: Every day | ORAL | 0 refills | Status: DC

## 2020-10-04 NOTE — Progress Notes (Signed)
[place] Work  Clinical Social Work met with patient in [place] office to for follow up counseling session. Patient provided up on recent scan and medical appointment. CSW reviewed previous session, including common emotional reactions to treatment and explored coping skills/outlets.  Patient plans to follow up with CSW as needed.  CSW briefly spoke with patient's mother.  CSW and mother plan to schedule follow up appointment to discuss resources and general information.    Gwinda Maine, LCSW  Clinical Social Worker [facility]

## 2020-10-04 NOTE — Progress Notes (Signed)
[place] at [place], [place] [address] [phone]   Interval Evaluation  Date of Service: 10/04/20 Patient Name: Gordon [name] Patient MRN: [id number] Patient DOB: [birth year] Provider: Ventura Sellers, MD  Identifying Statement:  Gordon [name] is a 25 y.o. male with left frontal  grade 4 astrocytoma    Oncologic History: Oncology History  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[place])  04/04/2020 Surgery   Craniotomy, resection of left frontal mass with [name]; path is grade 4 astrocytoma, IDH-1 mutant   04/20/2020 Surgery   Washout of surgical site collection; speciates MSSA   05/17/2020 Surgery   Repeat wound exploration, irrigation.   06/07/2020 -  Radiation Therapy   IMRT and concurrent Temodar 25m/m2   08/09/2020 -  Chemotherapy    Patient is on Treatment Plan: BRAIN GLIOBLASTOMA CONSOLIDATION TEMOZOLOMIDE DAYS 1-5 Q28 DAYS          Biomarkers:  MGMT Unknown.  IDH 1/2 Mutated.  EGFR Unknown  TERT Unknown   Interval History:  [name] to clinic for follow up today, now having completed cycle #2 of adjuvant Temodar.  No new or progressive neurologic complaints, denies seizures or headaches.  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  CNS imaging demonstrated an enhancing left frontal mass, which was resected on 04/04/20 by [name]  He tolerated surgery well, has no significant complaints at this time.  Last day of decadron taper is today.  He has been more or less independent at home, starting to resume some of his studies at school.  Currently a business student at [employer]  No other significant medical history.  Medications: Current Outpatient Medications on File Prior to  Visit  Medication Sig Dispense Refill   doxycycline (VIBRA-TABS) 100 MG tablet Take 1 tablet (100 mg total) by mouth 2 (two) times daily. 180 tablet 2   levETIRAcetam (KEPPRA) 750 MG tablet Take 1 tablet (750 mg total) by mouth 2 (two) times daily. 60 tablet 5   meloxicam (MOBIC) 15 MG tablet Take 1 tablet (15 mg total) by mouth daily as needed for pain. 30 tablet 0   methocarbamol (ROBAXIN) 500 MG tablet Take 1 tablet (500 mg total) by mouth every 8 (eight) hours as needed for muscle spasms. 60 tablet 0   Multiple Vitamin (MULTIVITAMIN PO) Take 1 capsule by mouth daily.     Omega-3 Fatty Acids (OMEGA 3 PO) Take 1 capsule by mouth daily.     ondansetron (ZOFRAN) 8 MG tablet Take 1 tablet (8 mg total) by mouth 2 (two) times daily as needed (nausea and vomiting). May take 30-60 minutes prior to Temodar administration if nausea/vomiting occurs. 30 tablet 1   Probiotic Product (PROBIOTIC PO) Take 1 capsule by mouth daily.     rifampin (RIFADIN) 300 MG capsule Take 300 mg by mouth 2 (two) times daily.     temozolomide (TEMODAR) 100 MG capsule Take 2 capsules (200 mg total) by mouth daily. May take on an empty stomach to decrease nausea & vomiting. 10 capsule 0   temozolomide (TEMODAR) 140 MG capsule Take 1 capsule (140 mg total) by mouth daily. May take on an empty stomach to decrease nausea & vomiting. 5 capsule 0   temozolomide (TEMODAR) 140 MG capsule Take 3 capsules (420 mg  total) by mouth daily. May take on an empty stomach to decrease nausea & vomiting. 15 capsule 0   No current facility-administered medications on file prior to visit.    Allergies: No Known Allergies Past Medical History:  Past Medical History:  Diagnosis Date   Bulging lumbar disc    Complication of anesthesia    PONV (postoperative nausea and vomiting)    Vomited x1 after craniotomy surgery   Seizure ([place]) 04/03/2020   x 1 at home pre-admission.  Otherwise, no history.   Past Surgical History:  Past Surgical  History:  Procedure Laterality Date   BURR HOLE Right 04/20/2020   Procedure: Irrigation and Debridement of Cranial Wound;  Surgeon: [name], MD;  Location: [facility];  Service: Neurosurgery;  Laterality: Right;   CRANIOTOMY Left 04/04/2020   Procedure: LEFT CRANIOTOMY FOR TUMOR EXCISION;  Surgeon: [name], MD;  Location: [facility];  Service: Neurosurgery;  Laterality: Left;   TONSILLECTOMY     WISDOM TOOTH EXTRACTION     WOUND EXPLORATION Left 05/17/2020   Procedure: CRANIAL WOUND EXPLORATION WITH PLACEMENT OF MESH;  Surgeon: [name], MD;  Location: [facility];  Service: Neurosurgery;  Laterality: Left;   Social History:  Social History   Socioeconomic History   Marital status: Single    Spouse name: Not on file   Number of children: 0   Years of education: Not on file   Highest education level: Not on file  Occupational History   Not on file  Tobacco Use   Smoking status: Never   Smokeless tobacco: Never  Vaping Use   Vaping Use: Never used  Substance and Sexual Activity   Alcohol use: No    Alcohol/week: 0.0 standard drinks   Drug use: No   Sexual activity: Yes    Birth control/protection: Condom  Other Topics Concern   Not on file  Social History Narrative   Not on file   Social Determinants of Health   Financial Resource Strain: Not on file  Food Insecurity: Not on file  Transportation Needs: Not on file  Physical Activity: Not on file  Stress: Not on file  Social Connections: Not on file  Intimate Partner Violence: Not on file   Family History: No family history on file.  Review of Systems: Constitutional: Doesn't report fevers, chills or abnormal weight loss Eyes: Doesn't report blurriness of vision Ears, nose, mouth, throat, and face: Doesn't report sore throat Respiratory: Doesn't report cough, dyspnea or wheezes Cardiovascular: Doesn't report palpitation, chest discomfort  Gastrointestinal:  Doesn't report nausea, constipation,  diarrhea GU: Doesn't report incontinence Skin: Doesn't report skin rashes Neurological: Per HPI Musculoskeletal: Doesn't report joint pain Behavioral/Psych: Doesn't report anxiety  Physical Exam: Vitals:   10/04/20 1147  BP: 120/64  Pulse: 79  Resp: 17  Temp: 98.2 F (36.8 C)  SpO2: 100%    KPS: 90. General: Alert, cooperative, pleasant, in no acute distress Head: Normal EENT: No conjunctival injection or scleral icterus.  Lungs: Resp effort normal Cardiac: Regular rate Abdomen: Non-distended abdomen Skin: No rashes cyanosis or petechiae. Extremities: No clubbing or edema  Neurologic Exam: Mental Status: Awake, alert, attentive to examiner. Oriented to self and environment. Language is fluent with intact comprehension.  Cranial Nerves: Visual acuity is grossly normal. Visual fields are full. Extra-ocular movements intact. No ptosis. Face is symmetric Motor: Tone and bulk are normal. Power is full in both arms and legs. Reflexes are symmetric, no pathologic reflexes present.  Sensory: Intact  to light touch Gait: Normal.   Labs: I have reviewed the data as listed    Component Value Date/Time   NA 141 10/04/2020 1129   K 4.4 10/04/2020 1129   CL 107 10/04/2020 1129   CO2 27 10/04/2020 1129   GLUCOSE 74 10/04/2020 1129   BUN 13 10/04/2020 1129   CREATININE 0.88 10/04/2020 1129   CREATININE 1.06 08/25/2020 1451   CALCIUM 9.7 10/04/2020 1129   PROT 7.2 10/04/2020 1129   ALBUMIN 4.4 10/04/2020 1129   AST 13 (L) 10/04/2020 1129   ALT 14 10/04/2020 1129   ALKPHOS 58 10/04/2020 1129   BILITOT 0.6 10/04/2020 1129   GFRNONAA >60 10/04/2020 1129   GFRAA  08/10/2009 2150    NOT CALCULATED        The eGFR has been calculated using the MDRD equation. This calculation has not been validated in all clinical situations. eGFR's persistently <60 mL/min signify possible Chronic Kidney Disease.   Lab Results  Component Value Date   WBC 4.8 10/04/2020   NEUTROABS 2.9  10/04/2020   HGB 13.5 10/04/2020   HCT 38.8 (L) 10/04/2020   MCV 87.8 10/04/2020   PLT 155 10/04/2020     Assessment/Plan 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[place])  Seizure ([facility])  [facility] is clinically stable today, now having completed cycle #2 of adjuvant Temodar.  MRI demonstrates new focus of contrast enhancement anterior to resection cavity; we suspect this is secondary to treatment effect, but will repeat a scan in 1 month for further clarification.  Of note, there was a focus of non-enhancing T2 signal abnormality underlying this site previously.    We recommended continuing treatment with cycle #3 Temozolomide at 200 mg/m2, on for five days and off for twenty three days in twenty eight day cycles. The patient will have a complete blood count performed on days 21 and 28 of each cycle, and a comprehensive metabolic panel performed on day 28 of each cycle. Labs may need to be performed more often. Zofran will prescribed for home use for nausea/vomiting.   Chemotherapy should be held for the following:  ANC less than 1,000  Platelets less than 100,000  LFT or creatinine greater than 2x ULN  If clinical concerns/contraindications develop  He will continue Keppra 723m BID.    AGlennon KopkoCranford will obtain MRI brain and follow up with Dr. DImagene Richesin early October following 1 month scan.  We will see him back in clinic in 4 weeks prior to cycle #4.  All questions were answered. The patient knows to call the clinic with any problems, questions or concerns. No barriers to learning were detected.  The total time spent in the encounter was 30 minutes and more than 50% was on counseling and review of test results   ZVentura Sellers MD Medical Director of Neuro-Oncology [facility]at [facility]09/12/22 11:44 AM

## 2020-10-05 ENCOUNTER — Other Ambulatory Visit: Payer: Self-pay | Admitting: Radiation Therapy

## 2020-10-22 ENCOUNTER — Other Ambulatory Visit: Payer: Self-pay

## 2020-10-22 ENCOUNTER — Ambulatory Visit (HOSPITAL_COMMUNITY)
Admission: RE | Admit: 2020-10-22 | Discharge: 2020-10-22 | Disposition: A | Discharge status: Home / Self Care | Payer: 59 | Source: Ambulatory Visit | Attending: Internal Medicine | Admitting: Internal Medicine

## 2020-10-22 DIAGNOSIS — C719 Malignant neoplasm of brain, unspecified: Secondary | ICD-10-CM | POA: Diagnosis not present

## 2020-10-22 MED ORDER — GADOBUTROL 1 MMOL/ML IV SOLN
10.0000 mL | Freq: Once | INTRAVENOUS | Status: AC | PRN
  Administered 2020-10-22: 10 mL via INTRAVENOUS

## 2020-10-25 ENCOUNTER — Inpatient Hospital Stay: Payer: 59

## 2020-10-25 ENCOUNTER — Other Ambulatory Visit: Payer: Self-pay

## 2020-10-25 ENCOUNTER — Other Ambulatory Visit: Payer: Self-pay | Admitting: Internal Medicine

## 2020-10-25 ENCOUNTER — Inpatient Hospital Stay: Payer: 59 | Attending: Internal Medicine

## 2020-10-25 DIAGNOSIS — C719 Malignant neoplasm of brain, unspecified: Secondary | ICD-10-CM

## 2020-10-25 DIAGNOSIS — C711 Malignant neoplasm of frontal lobe: Secondary | ICD-10-CM | POA: Insufficient documentation

## 2020-10-25 DIAGNOSIS — Z923 Personal history of irradiation: Secondary | ICD-10-CM | POA: Diagnosis not present

## 2020-10-25 DIAGNOSIS — R569 Unspecified convulsions: Secondary | ICD-10-CM | POA: Diagnosis not present

## 2020-10-25 LAB — CMP (CANCER CENTER ONLY)
ALT: 18 U/L (ref 0–44)
AST: 16 U/L (ref 15–41)
Albumin: 4.6 g/dL (ref 3.5–5.0)
Alkaline Phosphatase: 58 U/L (ref 38–126)
Anion gap: 9 (ref 5–15)
BUN: 12 mg/dL (ref 6–20)
CO2: 26 mmol/L (ref 22–32)
Calcium: 9.9 mg/dL (ref 8.9–10.3)
Chloride: 107 mmol/L (ref 98–111)
Creatinine: 1.16 mg/dL (ref 0.61–1.24)
GFR, Estimated: >60 mL/min (ref >60)
Glucose, Bld: 85 mg/dL (ref 70–99)
Potassium: 4.7 mmol/L (ref 3.5–5.1)
Sodium: 142 mmol/L (ref 135–145)
Total Bilirubin: 0.5 mg/dL (ref 0.3–1.2)
Total Protein: 7.3 g/dL (ref 6.5–8.1)

## 2020-10-25 LAB — CBC WITH DIFFERENTIAL (CANCER CENTER ONLY)
Abs Immature Granulocytes: 0.01 10*3/uL (ref 0.00–0.07)
Basophils Absolute: 0 10*3/uL (ref 0.0–0.1)
Basophils Relative: 1 %
Eosinophils Absolute: 0.1 10*3/uL (ref 0.0–0.5)
Eosinophils Relative: 2 %
HCT: 40.3 % (ref 39.0–52.0)
Hemoglobin: 13.9 g/dL (ref 13.0–17.0)
Immature Granulocytes: 0 %
Lymphocytes Relative: 26 %
Lymphs Abs: 1.1 10*3/uL (ref 0.7–4.0)
MCH: 30.6 pg (ref 26.0–34.0)
MCHC: 34.5 g/dL (ref 30.0–36.0)
MCV: 88.8 fL (ref 80.0–100.0)
Monocytes Absolute: 0.4 10*3/uL (ref 0.1–1.0)
Monocytes Relative: 9 %
Neutro Abs: 2.5 10*3/uL (ref 1.7–7.7)
Neutrophils Relative %: 62 %
Platelet Count: 223 10*3/uL (ref 150–400)
RBC: 4.54 MIL/uL (ref 4.22–5.81)
RDW: 12.9 % (ref 11.5–15.5)
WBC Count: 4.1 10*3/uL (ref 4.0–10.5)
nRBC: 0 % (ref 0.0–0.2)

## 2020-10-27 ENCOUNTER — Other Ambulatory Visit: Payer: Self-pay

## 2020-10-27 ENCOUNTER — Encounter: Payer: Self-pay | Admitting: Internal Medicine

## 2020-10-27 DIAGNOSIS — C719 Malignant neoplasm of brain, unspecified: Secondary | ICD-10-CM

## 2020-10-27 NOTE — Progress Notes (Signed)
RI

## 2020-11-01 ENCOUNTER — Inpatient Hospital Stay (HOSPITAL_BASED_OUTPATIENT_CLINIC_OR_DEPARTMENT_OTHER): Payer: 59 | Admitting: Internal Medicine

## 2020-11-01 ENCOUNTER — Other Ambulatory Visit: Payer: Self-pay

## 2020-11-01 ENCOUNTER — Inpatient Hospital Stay: Payer: 59

## 2020-11-01 VITALS — BP 144/68 | HR 77 | Temp 98.0°F | Resp 18 | Wt 216.0 lb

## 2020-11-01 DIAGNOSIS — C719 Malignant neoplasm of brain, unspecified: Secondary | ICD-10-CM

## 2020-11-01 DIAGNOSIS — C711 Malignant neoplasm of frontal lobe: Secondary | ICD-10-CM | POA: Diagnosis not present

## 2020-11-01 DIAGNOSIS — R569 Unspecified convulsions: Secondary | ICD-10-CM

## 2020-11-01 LAB — CBC WITH DIFFERENTIAL (CANCER CENTER ONLY)
Abs Immature Granulocytes: 0.01 10*3/uL (ref 0.00–0.07)
Basophils Absolute: 0 10*3/uL (ref 0.0–0.1)
Basophils Relative: 1 %
Eosinophils Absolute: 0.1 10*3/uL (ref 0.0–0.5)
Eosinophils Relative: 2 %
HCT: 39.9 % (ref 39.0–52.0)
Hemoglobin: 14 g/dL (ref 13.0–17.0)
Immature Granulocytes: 0 %
Lymphocytes Relative: 27 %
Lymphs Abs: 1.1 10*3/uL (ref 0.7–4.0)
MCH: 30.9 pg (ref 26.0–34.0)
MCHC: 35.1 g/dL (ref 30.0–36.0)
MCV: 88.1 fL (ref 80.0–100.0)
Monocytes Absolute: 0.4 10*3/uL (ref 0.1–1.0)
Monocytes Relative: 10 %
Neutro Abs: 2.4 10*3/uL (ref 1.7–7.7)
Neutrophils Relative %: 60 %
Platelet Count: 169 10*3/uL (ref 150–400)
RBC: 4.53 MIL/uL (ref 4.22–5.81)
RDW: 12.8 % (ref 11.5–15.5)
WBC Count: 4.1 10*3/uL (ref 4.0–10.5)
nRBC: 0 % (ref 0.0–0.2)

## 2020-11-01 LAB — CMP (CANCER CENTER ONLY)
ALT: 15 U/L (ref 0–44)
AST: 14 U/L — ABNORMAL LOW (ref 15–41)
Albumin: 4.6 g/dL (ref 3.5–5.0)
Alkaline Phosphatase: 60 U/L (ref 38–126)
Anion gap: 7 (ref 5–15)
BUN: 11 mg/dL (ref 6–20)
CO2: 26 mmol/L (ref 22–32)
Calcium: 9.7 mg/dL (ref 8.9–10.3)
Chloride: 106 mmol/L (ref 98–111)
Creatinine: 1.34 mg/dL — ABNORMAL HIGH (ref 0.61–1.24)
GFR, Estimated: >60 mL/min (ref >60)
Glucose, Bld: 82 mg/dL (ref 70–99)
Potassium: 4.4 mmol/L (ref 3.5–5.1)
Sodium: 139 mmol/L (ref 135–145)
Total Bilirubin: 0.4 mg/dL (ref 0.3–1.2)
Total Protein: 7.4 g/dL (ref 6.5–8.1)

## 2020-11-01 NOTE — Progress Notes (Signed)
[facility] at [place], [place] [address] [phone]   Interval Evaluation  Date of Service: 11/01/20 Patient Name: Gordon [name] Patient MRN: [id number] Patient DOB: May 08, 1995 Provider: Ventura Sellers, MD  Identifying Statement:  Gordon [name] is a 25 y.o. male with left frontal  grade 4 astrocytoma    Oncologic History: Oncology History  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[place])  04/04/2020 Surgery   Craniotomy, resection of left frontal mass with [name]; path is grade 4 astrocytoma, IDH-1 mutant   04/20/2020 Surgery   Washout of surgical site collection; speciates MSSA   05/17/2020 Surgery   Repeat wound exploration, irrigation.   06/07/2020 -  Radiation Therapy   IMRT and concurrent Temodar 11m/m2   08/09/2020 -  Chemotherapy    Patient is on Treatment Plan: BRAIN GLIOBLASTOMA CONSOLIDATION TEMOZOLOMIDE DAYS 1-5 Q28 DAYS          Biomarkers:  MGMT Unknown.  IDH 1/2 Mutated.  EGFR Unknown  TERT Unknown   Interval History:  Gordon Ovenspresents to clinic for follow up today, now having completed cycle #3 of adjuvant Temodar.  No new or progressive neurologic complaints, denies seizures.  Does describe daily headaches this month which coincided with TMZ/Zofran dosing.  [facility] Continues to work bCatering manager  H+P (04/12/20) Patient presented to medical attention three weeks prior with episode of loss of awareness and generalized shaking consistent with seizure episode.  CNS imaging demonstrated an enhancing left frontal mass, which was resected on 04/04/20 by [name]  He tolerated surgery well, has no significant complaints at this time.  Last day of decadron taper is today.  He has been more or less independent at home, starting to resume some of his studies at school.  Currently a business student at [employer]  No other significant medical  history.  Medications: Current Outpatient Medications on File Prior to Visit  Medication Sig Dispense Refill   doxycycline (VIBRA-TABS) 100 MG tablet Take 1 tablet (100 mg total) by mouth 2 (two) times daily. 180 tablet 2   levETIRAcetam (KEPPRA) 750 MG tablet Take 1 tablet (750 mg total) by mouth 2 (two) times daily. 60 tablet 5   meloxicam (MOBIC) 15 MG tablet Take 1 tablet (15 mg total) by mouth daily as needed for pain. (Patient not taking: Reported on 10/04/2020) 30 tablet 0   methocarbamol (ROBAXIN) 500 MG tablet Take 1 tablet (500 mg total) by mouth every 8 (eight) hours as needed for muscle spasms. (Patient not taking: Reported on 10/04/2020) 60 tablet 0   Multiple Vitamin (MULTIVITAMIN PO) Take 1 capsule by mouth daily.     Omega-3 Fatty Acids (OMEGA 3 PO) Take 1 capsule by mouth daily.     ondansetron (ZOFRAN) 8 MG tablet Take 1 tablet (8 mg total) by mouth 2 (two) times daily as needed (nausea and vomiting). May take 30-60 minutes prior to Temodar administration if nausea/vomiting occurs. (Patient not taking: Reported on 10/04/2020) 30 tablet 1   Probiotic Product (PROBIOTIC PO) Take 1 capsule by mouth daily.     temozolomide (TEMODAR) 140 MG capsule Take 3 capsules (420 mg total) by mouth daily. May take on an empty stomach to decrease nausea & vomiting. (Patient not taking: Reported on 10/04/2020) 15 capsule 0   temozolomide (TEMODAR) 140 MG capsule TAKE 3 CAPSULES BY MOUTH  DAILY FOR 5 DAYS OF 28 DAY  CYCLE MAY TAKE  ON AN EMPTY  STOMACH TO DECREASE NAUSEA  &amp; VOMITING. 15 capsule 0   No current facility-administered medications on file prior to visit.    Allergies: No Known Allergies Past Medical History:  Past Medical History:  Diagnosis Date   Bulging lumbar disc    Complication of anesthesia    PONV (postoperative nausea and vomiting)    Vomited x1 after craniotomy surgery   Seizure ([place]) 04/03/2020   x 1 at home pre-admission.  Otherwise, no history.   Past Surgical  History:  Past Surgical History:  Procedure Laterality Date   BURR HOLE Right 04/20/2020   Procedure: Irrigation and Debridement of Cranial Wound;  Surgeon: [name], MD;  Location: [facility];  Service: Neurosurgery;  Laterality: Right;   CRANIOTOMY Left 04/04/2020   Procedure: LEFT CRANIOTOMY FOR TUMOR EXCISION;  Surgeon: [name], MD;  Location: [facility];  Service: Neurosurgery;  Laterality: Left;   TONSILLECTOMY     WISDOM TOOTH EXTRACTION     WOUND EXPLORATION Left 05/17/2020   Procedure: CRANIAL WOUND EXPLORATION WITH PLACEMENT OF MESH;  Surgeon: [name], MD;  Location: [facility];  Service: Neurosurgery;  Laterality: Left;   Social History:  Social History   Socioeconomic History   Marital status: Single    Spouse name: Not on file   Number of children: 0   Years of education: Not on file   Highest education level: Not on file  Occupational History   Not on file  Tobacco Use   Smoking status: Never   Smokeless tobacco: Never  Vaping Use   Vaping Use: Never used  Substance and Sexual Activity   Alcohol use: No    Alcohol/week: 0.0 standard drinks   Drug use: No   Sexual activity: Yes    Birth control/protection: Condom  Other Topics Concern   Not on file  Social History Narrative   Not on file   Social Determinants of Health   Financial Resource Strain: Not on file  Food Insecurity: Not on file  Transportation Needs: Not on file  Physical Activity: Not on file  Stress: Not on file  Social Connections: Not on file  Intimate Partner Violence: Not on file   Family History: No family history on file.  Review of Systems: Constitutional: Doesn't report fevers, chills or abnormal weight loss Eyes: Doesn't report blurriness of vision Ears, nose, mouth, throat, and face: Doesn't report sore throat Respiratory: Doesn't report cough, dyspnea or wheezes Cardiovascular: Doesn't report palpitation, chest discomfort  Gastrointestinal:  Doesn't report  nausea, constipation, diarrhea GU: Doesn't report incontinence Skin: Doesn't report skin rashes Neurological: Per HPI Musculoskeletal: Doesn't report joint pain Behavioral/Psych: Doesn't report anxiety  Physical Exam: Vitals:   11/01/20 1004  BP: (!) 144/68  Pulse: 77  Resp: 18  Temp: 98 F (36.7 C)  SpO2: 100%   KPS: 90. General: Alert, cooperative, pleasant, in no acute distress Head: Normal EENT: No conjunctival injection or scleral icterus.  Lungs: Resp effort normal Cardiac: Regular rate Abdomen: Non-distended abdomen Skin: No rashes cyanosis or petechiae. Extremities: No clubbing or edema  Neurologic Exam: Mental Status: Awake, alert, attentive to examiner. Oriented to self and environment. Language is fluent with intact comprehension.  Cranial Nerves: Visual acuity is grossly normal. Visual fields are full. Extra-ocular movements intact. No ptosis. Face is symmetric Motor: Tone and bulk are normal. Power is full in both arms and legs. Reflexes are symmetric, no pathologic reflexes present.  Sensory: Intact to light touch Gait:  Normal.   Labs: I have reviewed the data as listed    Component Value Date/Time   NA 142 10/25/2020 0929   K 4.7 10/25/2020 0929   CL 107 10/25/2020 0929   CO2 26 10/25/2020 0929   GLUCOSE 85 10/25/2020 0929   BUN 12 10/25/2020 0929   CREATININE 1.16 10/25/2020 0929   CREATININE 1.06 08/25/2020 1451   CALCIUM 9.9 10/25/2020 0929   PROT 7.3 10/25/2020 0929   ALBUMIN 4.6 10/25/2020 0929   AST 16 10/25/2020 0929   ALT 18 10/25/2020 0929   ALKPHOS 58 10/25/2020 0929   BILITOT 0.5 10/25/2020 0929   GFRNONAA >60 10/25/2020 0929   GFRAA  08/10/2009 2150    NOT CALCULATED        The eGFR has been calculated using the MDRD equation. This calculation has not been validated in all clinical situations. eGFR's persistently <60 mL/min signify possible Chronic Kidney Disease.   Lab Results  Component Value Date   WBC 4.1 10/25/2020    NEUTROABS 2.5 10/25/2020   HGB 13.9 10/25/2020   HCT 40.3 10/25/2020   MCV 88.8 10/25/2020   PLT 223 10/25/2020    Imaging:  [facility] Clinician Interpretation: I have personally reviewed the CNS images as listed.  My interpretation, in the context of the patient's clinical presentation, is likely treatment effect  MR Brain W Wo Contrast  Result Date: 10/23/2020 CLINICAL DATA:  Grade 4 astrocytoma follow-up. Status post radiation. Status post surgery. EXAM: MRI HEAD WITHOUT AND WITH CONTRAST TECHNIQUE: Multiplanar, multiecho pulse sequences of the brain and surrounding structures were obtained without and with intravenous contrast. CONTRAST:  58m GADAVIST GADOBUTROL 1 MMOL/ML IV SOLN COMPARISON:  MR head without and with contrast 09/23/2020 07/30/2020, and 05/15/2020. FINDINGS: Brain: Left frontal lobe resection again noted. Peripherally enhancing area of enhancement anterior to the surgical cavity has increased in size, now measuring 8 mm with on the axial images and 15 x 9 mm on the sagittal images. This compares to measurements of 7 mm and 12 x 7 mm. No new areas of enhancement are present. Associated T2 signal changes about the area of enhancement have increased as well. Other T2 signal changes are stable. Ex vacuo dilation of the left lateral ventricle and other volume loss is stable. No new contralateral lesions are present. White matter is otherwise within normal limits. The brainstem and cerebellum are within normal limits. The internal auditory canals are within normal limits. Vascular: Flow is present in the major intracranial arteries. Skull and upper cervical spine: The craniocervical junction is normal. Upper cervical spine is within normal limits. Marrow signal is unremarkable. Sinuses/Orbits: Polyps or mucous retention cysts are again noted along the inferior maxillary sinuses bilaterally. No other significant sinus disease is present. The globes and orbits are within normal limits.  IMPRESSION: 1. Slight increase in size of peripherally enhancing area anterior to the surgical cavity and increased T2 signal changes. Findings may be related to radiation necrosis or tumor. 2. Stable T2 signal changes otherwise. 3. No acute intracranial abnormality or significant interval change. Electronically Signed   By: CSan MorelleM.D.   On: 10/23/2020 08:01    Assessment/Plan 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[place]  [place]izure ([place]  AMilos MilliganCranford is clinically and radiographically stable today, now having completed cycle #3 of adjuvant Temodar.  MRI demonstrates relative stability of focus of contrast enhancement anterior to resection cavity; we agree this is likely secondary to treatment effect.    We recommended continuing treatment with cycle #  4 Temozolomide at 200 mg/m2, on for five days and off for twenty three days in twenty eight day cycles. The patient will have a complete blood count performed on days 21 and 28 of each cycle, and a comprehensive metabolic panel performed on day 28 of each cycle. Labs may need to be performed more often. Compazine, rather than zofran will prescribed for home use for nausea/vomiting due to headaches this past month.   Chemotherapy should be held for the following:  ANC less than 1,000  Platelets less than 100,000  LFT or creatinine greater than 2x ULN  If clinical concerns/contraindications develop  He will continue Keppra 765m BID.  Also on Ritalin now 553mBID.  AnLaverne Klughranford should return to clinic in 4 weeks with labs prior to cycle #5.  All questions were answered. The patient knows to call the clinic with any problems, questions or concerns. No barriers to learning were detected.  The total time spent in the encounter was 30 minutes and more than 50% was on counseling and review of test results   ZaVentura SellersMD Medical Director of Neuro-Oncology CoSaint [facility] Hospitalt WeShattuck0/10/22 9:58 AM

## 2020-11-02 ENCOUNTER — Telehealth: Payer: Self-pay | Admitting: Internal Medicine

## 2020-11-02 NOTE — Telephone Encounter (Signed)
Scheduled appt per 10/10 los. Pt is aware of appt date and time.

## 2020-11-04 ENCOUNTER — Ambulatory Visit: Payer: 59 | Admitting: Internal Medicine

## 2020-11-04 ENCOUNTER — Other Ambulatory Visit: Payer: 59

## 2020-11-04 ENCOUNTER — Other Ambulatory Visit: Payer: Self-pay | Admitting: Radiation Therapy

## 2020-11-15 ENCOUNTER — Other Ambulatory Visit: Payer: Self-pay | Admitting: Internal Medicine

## 2020-11-15 NOTE — Telephone Encounter (Signed)
Patient will have labs and md visit prior to reordering the next Temozolomide.

## 2020-11-24 ENCOUNTER — Ambulatory Visit (INDEPENDENT_AMBULATORY_CARE_PROVIDER_SITE_OTHER): Payer: 59 | Admitting: Internal Medicine

## 2020-11-24 ENCOUNTER — Other Ambulatory Visit: Payer: Self-pay

## 2020-11-24 VITALS — BP 152/77 | HR 100 | Temp 98.0°F | Resp 16 | Ht 75.0 in | Wt 213.0 lb

## 2020-11-24 DIAGNOSIS — T8149XA Infection following a procedure, other surgical site, initial encounter: Secondary | ICD-10-CM | POA: Diagnosis not present

## 2020-11-24 DIAGNOSIS — T847XXD Infection and inflammatory reaction due to other internal orthopedic prosthetic devices, implants and grafts, subsequent encounter: Secondary | ICD-10-CM | POA: Diagnosis not present

## 2020-11-24 NOTE — Patient Instructions (Signed)
You are doing very well  We have decided together to continue doxy indefinitely for now   Please do cbc, cmp, crp when you see your chemo doctor so we don't have to do it today  Follow up in 3 months

## 2020-11-24 NOTE — Progress Notes (Signed)
[facility]  Patient Active Problem List   Diagnosis Date Noted   Hardware complicating wound infection ([place])    Wound infection 05/16/2020   Wound infection after surgery 04/19/2020   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[place]) 04/04/2020   Brain mass 04/02/2020   Seizure ([place]) 04/02/2020   Left knee injury, subsequent encounter 06/13/2017   Low back pain 03/25/2014      Subjective:    Patient ID: Gordon [name], male    DOB: 1995-08-12, 25 y.o.   MRN: [id number]  No chief complaint on file.   HPI:  Gordon [name] is a 25 y.o. 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 cx the second time MRSE and mssa but no p. Acnes  He is doing well on mino/rifampin No seizure. Just taking keppra for seizure  Chemo tx of tumor planned soon once infection controlled and wound healed.   07/01/2020 id clinic f/u Patient had been continued on chemo for his brain cancer Doing well so far No issue with surgical site/titanium mesh wound infection No n/v/diarrhea No joint pain  No pigmentation   08/25/2020 id clinic f/u Patient is s/p 3 months of rifampin/minocycline for surgical site related mrse/mssa hardware related OM of the craniotomy site Previous crp improving Tolerating abx without n/v/diarrhea/rash/myalgia/arthralgia Chemotherapy going well; will be on that for the next year, 28 day cycle. No end date on duration except 1 year of these 28 day cycles.  11/02 id f/u Tolerating chemo Tolerating doxy No n/v/diarrhea No focal weakness/numbness No headache No visual change  No complaint today  No Known Allergies    Outpatient Medications Prior to Visit  Medication Sig Dispense Refill   doxycycline (VIBRAMYCIN) 100 MG capsule Take 100 mg by mouth 2 (two) times  daily.     levETIRAcetam (KEPPRA) 750 MG tablet Take 1 tablet (750 mg total) by mouth 2 (two) times daily. 60 tablet 5   methylphenidate (RITALIN) 5 MG tablet Take 10 mg by mouth 2 (two) times daily.     Multiple Vitamin (MULTIVITAMIN PO) Take 1 capsule by mouth daily.     Omega-3 Fatty Acids (OMEGA 3 PO) Take 1 capsule by mouth daily.     ondansetron (ZOFRAN) 8 MG tablet Take 1 tablet (8 mg total) by mouth 2 (two) times daily as needed (nausea and vomiting). May take 30-60 minutes prior to Temodar administration if nausea/vomiting occurs. 30 tablet 1   Probiotic Product (PROBIOTIC PO) Take 1 capsule by mouth daily.     prochlorperazine (COMPAZINE) 10 MG tablet Take 10 mg by mouth every 6 (six) hours as needed.     temozolomide (TEMODAR) 140 MG capsule Take 3 capsules (420 mg total) by mouth daily. May take on an empty stomach to decrease nausea & vomiting. 15 capsule 0   temozolomide (TEMODAR) 140 MG capsule TAKE 3 CAPSULES BY MOUTH  DAILY FOR 5 DAYS OF 28 DAY  CYCLE MAY TAKE ON AN EMPTY  STOMACH TO DECREASE NAUSEA  &amp; VOMITING. 15 capsule 0   No facility-administered medications prior to visit.     Social History   Socioeconomic History   Marital status: Single    Spouse name: Not on file   Number of children: 0   Years of education: Not on file  Highest education level: Not on file  Occupational History   Not on file  Tobacco Use   Smoking status: Never   Smokeless tobacco: Never  Vaping Use   Vaping Use: Never used  Substance and Sexual Activity   Alcohol use: No    Alcohol/week: 0.0 standard drinks   Drug use: No   Sexual activity: Yes    Birth control/protection: Condom  Other Topics Concern   Not on file  Social History Narrative   Not on file   Social Determinants of Health   Financial Resource Strain: Not on file  Food Insecurity: Not on file  Transportation Needs: Not on file  Physical Activity: Not on file  Stress: Not on file  Social Connections: Not on  file  Intimate Partner Violence: Not on file      Review of Systems    All other ros negative  Objective:    BP (!) 152/77   Pulse 100   Temp 98 F (36.7 C) (Temporal)   Resp 16   Ht 6' 3"  (1.905 m)   Wt 213 lb (96.6 kg)   SpO2 100%   BMI 26.62 kg/m  Nursing note and vital signs reviewed.  Physical Exam General/constitutional: no distress, pleasant HEENT: Normocephalic, PER, Conj Clear, EOMI, Oropharynx clear Neck supple CV: rrr no mrg Lungs: clear to auscultation, normal respiratory effort Abd: Soft, Nontender Ext: no edema Skin: No Rash Neuro: craniotomy site all healed no swelling/tenderness/redness/fluctuance MSK: no peripheral joint swelling/tenderness/warmth; back spines nontender          Labs: Lab Results  Component Value Date   WBC 4.1 11/01/2020   HGB 14.0 11/01/2020   HCT 39.9 11/01/2020   MCV 88.1 11/01/2020   PLT 169 63/14/9702   Last metabolic panel Lab Results  Component Value Date   GLUCOSE 82 11/01/2020   NA 139 11/01/2020   K 4.4 11/01/2020   CL 106 11/01/2020   CO2 26 11/01/2020   BUN 11 11/01/2020   CREATININE 1.34 (H) 11/01/2020   GFRNONAA >60 11/01/2020   GFRAA  08/10/2009    NOT CALCULATED        The eGFR has been calculated using the MDRD equation. This calculation has not been validated in all clinical situations. eGFR's persistently <60 mL/min signify possible Chronic Kidney Disease.   CALCIUM 9.7 11/01/2020   PROT 7.4 11/01/2020   ALBUMIN 4.6 11/01/2020   BILITOT 0.4 11/01/2020   ALKPHOS 60 11/01/2020   AST 14 (L) 11/01/2020   ALT 15 11/01/2020   ANIONGAP 7 11/01/2020   Crp: 5/11  1.2 4/23  5.1  Micro: 4/25 OR debridement cx Mrse (S bactrim, tetra) Mssa (S bactrim, tetra)  Serology:  Imaging: 4/23 mri brain wwo contrast IMPRESSION: 1. Status post left frontal tumor resection with decreased blood products at the resection site. Nonspecific contrast enhancement surrounding the resection cavity  may be postsurgical. 2. Decreased size of scalp fluid collection overlying the craniotomy site. No specific features of infection. 3. Dural thickening underlying the craniotomy, likely reactive.      Assessment & Plan:   Problem List Items Addressed This Visit   None  Abx:  4/26-8/03 mino 4/26-8/03 rifampin (300 rif bid)  25 yo male hx brain tumore s/p excision 3/13 with placement of titanium plate, complicated by post op wound infection s/o I&D 3/29. Initial cx mssa and p-acnes s/p short course of abx, but readmitted for ongoing infection s/p repeat I&D 4/25 with placement of titanium mesh placement  Continue to do well Tolerating antibiotics Labs appear without abx toxicity. Crp not checked, will need to trend  Abx planned 3 months mino/rifampin until end of 07/2019, then indefinitely with doxycycline or until mesh can be removed  08/25/20 id f/u Doing well Infection appears well controlled We could stop rifampin today and continue him on indefinite tetracycyline, can plan to switch mino to doxy Patient is going to be on Temodar chemo --> no DDI on check 08/25/2020 for doxycycline  11/02 id f/u Doing well Tolerating chemo -- no side effect; plan 1 year duration   -continue doxy: we did discuss options to stop and trend crp vs continuing doxy; did give my bias of keeping on doxy. Family agree to keep on doxy -labs cbc, cmp, crp can be done next week with chemotherapy -f/u 3 months    Follow-up: Return in about 3 months (around 02/24/2021).      Jabier Mutton, [facility] [phone]  pager   [phone] cell 11/24/2020, 3:15 PM

## 2020-11-29 ENCOUNTER — Telehealth: Payer: Self-pay | Admitting: Internal Medicine

## 2020-11-29 ENCOUNTER — Inpatient Hospital Stay (HOSPITAL_BASED_OUTPATIENT_CLINIC_OR_DEPARTMENT_OTHER): Payer: 59 | Admitting: Internal Medicine

## 2020-11-29 ENCOUNTER — Encounter: Payer: Self-pay | Admitting: Internal Medicine

## 2020-11-29 ENCOUNTER — Other Ambulatory Visit: Payer: Self-pay

## 2020-11-29 ENCOUNTER — Inpatient Hospital Stay: Payer: 59 | Attending: Internal Medicine

## 2020-11-29 VITALS — BP 136/77 | HR 77 | Temp 98.2°F | Resp 18 | Wt 214.5 lb

## 2020-11-29 DIAGNOSIS — C719 Malignant neoplasm of brain, unspecified: Secondary | ICD-10-CM

## 2020-11-29 DIAGNOSIS — R569 Unspecified convulsions: Secondary | ICD-10-CM

## 2020-11-29 DIAGNOSIS — C711 Malignant neoplasm of frontal lobe: Secondary | ICD-10-CM | POA: Diagnosis present

## 2020-11-29 LAB — CBC WITH DIFFERENTIAL (CANCER CENTER ONLY)
Abs Immature Granulocytes: 0.02 10*3/uL (ref 0.00–0.07)
Basophils Absolute: 0 10*3/uL (ref 0.0–0.1)
Basophils Relative: 1 %
Eosinophils Absolute: 0.1 10*3/uL (ref 0.0–0.5)
Eosinophils Relative: 2 %
HCT: 40.2 % (ref 39.0–52.0)
Hemoglobin: 14.4 g/dL (ref 13.0–17.0)
Immature Granulocytes: 1 %
Lymphocytes Relative: 26 %
Lymphs Abs: 1.1 10*3/uL (ref 0.7–4.0)
MCH: 30.8 pg (ref 26.0–34.0)
MCHC: 35.8 g/dL (ref 30.0–36.0)
MCV: 85.9 fL (ref 80.0–100.0)
Monocytes Absolute: 0.4 10*3/uL (ref 0.1–1.0)
Monocytes Relative: 10 %
Neutro Abs: 2.5 10*3/uL (ref 1.7–7.7)
Neutrophils Relative %: 60 %
Platelet Count: 150 10*3/uL (ref 150–400)
RBC: 4.68 MIL/uL (ref 4.22–5.81)
RDW: 12.5 % (ref 11.5–15.5)
WBC Count: 4.1 10*3/uL (ref 4.0–10.5)
nRBC: 0 % (ref 0.0–0.2)

## 2020-11-29 LAB — CMP (CANCER CENTER ONLY)
ALT: 19 U/L (ref 0–44)
AST: 16 U/L (ref 15–41)
Albumin: 4.5 g/dL (ref 3.5–5.0)
Alkaline Phosphatase: 58 U/L (ref 38–126)
Anion gap: 8 (ref 5–15)
BUN: 12 mg/dL (ref 6–20)
CO2: 26 mmol/L (ref 22–32)
Calcium: 9.7 mg/dL (ref 8.9–10.3)
Chloride: 105 mmol/L (ref 98–111)
Creatinine: 0.85 mg/dL (ref 0.61–1.24)
GFR, Estimated: >60 mL/min (ref >60)
Glucose, Bld: 96 mg/dL (ref 70–99)
Potassium: 4.5 mmol/L (ref 3.5–5.1)
Sodium: 139 mmol/L (ref 135–145)
Total Bilirubin: 0.5 mg/dL (ref 0.3–1.2)
Total Protein: 7.4 g/dL (ref 6.5–8.1)

## 2020-11-29 MED ORDER — TEMOZOLOMIDE 140 MG PO CAPS: 200.0000 mg/m2/d | ORAL_CAPSULE | Freq: Every day | ORAL | 0 refills | Status: DC

## 2020-11-29 NOTE — Telephone Encounter (Signed)
Scheduled per 11/7 los, pt has been called and pt's mother confirmed appt

## 2020-11-29 NOTE — Progress Notes (Signed)
[facility], [place] [address] [phone]   Interval Evaluation  Date of Service: 11/29/20 Patient Name: Gordon [name] Patient MRN: [id number] Patient DOB: March 24, 1995 Provider: Ventura Sellers, MD  Identifying Statement:  Gordon [name] is a 25 y.o. 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; path is grade 4 astrocytoma, IDH-1 mutant   04/20/2020 Surgery   [place] of surgical site collection; speciates MSSA   05/17/2020 Surgery   Repeat wound exploration, irrigation.   06/07/2020 -  Radiation Therapy   IMRT and concurrent Temodar 75m/m2   08/09/2020 -  Chemotherapy    Patient is on Treatment Plan: BRAIN GLIOBLASTOMA CONSOLIDATION TEMOZOLOMIDE DAYS 1-5 Q28 DAYS          Biomarkers:  MGMT Unknown.  IDH 1/2 Mutated.  EGFR Unknown  TERT Unknown   Interval History:  AKaydon Husbypresents to clinic for follow up today, now having completed cycle #4 of adjuvant Temodar.  No new or progressive neurologic complaints, denies seizures.  He does describe a bit more nausea this month with the compazine in place of zofran (headaches with zofran). MRI was completed last week and reviewed with Dr[facility]agene [facility]at [facility] Continues to work bCatering manager  H+P (04/12/20) Patient presented to medical attention three weeks prior with episode of loss of awareness and generalized shaking consistent with seizure episode.  CNS imaging demonstrated an enhancing left frontal mass, which was resected on 04/04/20 by [name]  He tolerated surgery well, has no significant complaints at this time.  Last day of decadron taper is today.  He has been more or less independent at home, starting to resume some of his studies at school.  Currently a  business student at [employer]  No other significant medical history.  Medications: Current Outpatient Medications on File Prior to Visit  Medication Sig Dispense Refill   doxycycline (VIBRAMYCIN) 100 MG capsule Take 100 mg by mouth 2 (two) times daily.     levETIRAcetam (KEPPRA) 750 MG tablet Take 1 tablet (750 mg total) by mouth 2 (two) times daily. 60 tablet 5   methylphenidate (RITALIN) 5 MG tablet Take 10 mg by mouth 2 (two) times daily.     Multiple Vitamin (MULTIVITAMIN PO) Take 1 capsule by mouth daily.     Omega-3 Fatty Acids (OMEGA 3 PO) Take 1 capsule by mouth daily.     ondansetron (ZOFRAN) 8 MG tablet Take 1 tablet (8 mg total) by mouth 2 (two) times daily as needed (nausea and vomiting). May take 30-60 minutes prior to Temodar administration if nausea/vomiting occurs. 30 tablet 1   Probiotic Product (PROBIOTIC PO) Take 1 capsule by mouth daily.     prochlorperazine (COMPAZINE) 10 MG tablet Take 10 mg by mouth every 6 (six) hours as needed.     temozolomide (TEMODAR) 140 MG capsule Take 3 capsules (420 mg total) by mouth daily. May take on an empty stomach to decrease nausea & vomiting. 15 capsule 0   temozolomide (TEMODAR) 140 MG capsule TAKE 3 CAPSULES BY MOUTH  DAILY FOR 5 DAYS OF 28 DAY  CYCLE MAY TAKE ON AN EMPTY  STOMACH TO DECREASE NAUSEA  &amp; VOMITING. 15 capsule 0   No current facility-administered medications on file prior to visit.  Allergies: No Known Allergies Past Medical History:  Past Medical History:  Diagnosis Date   Bulging lumbar disc    Complication of anesthesia    PONV (postoperative nausea and vomiting)    Vomited x1 after craniotomy surgery   Seizure ([place]) 04/03/2020   x 1 at home pre-admission.  Otherwise, no history.   Past Surgical History:  Past Surgical History:  Procedure Laterality Date   BURR HOLE Right 04/20/2020   Procedure: Irrigation and Debridement of Cranial Wound;  Surgeon: [name], MD;  Location: [facility];  Service:  Neurosurgery;  Laterality: Right;   CRANIOTOMY Left 04/04/2020   Procedure: LEFT CRANIOTOMY FOR TUMOR EXCISION;  Surgeon: [name], MD;  Location: [facility];  Service: Neurosurgery;  Laterality: Left;   TONSILLECTOMY     WISDOM TOOTH EXTRACTION     WOUND EXPLORATION Left 05/17/2020   Procedure: CRANIAL WOUND EXPLORATION WITH PLACEMENT OF MESH;  Surgeon: [name], MD;  Location: [facility];  Service: Neurosurgery;  Laterality: Left;   Social History:  Social History   Socioeconomic History   Marital status: Single    Spouse name: Not on file   Number of children: 0   Years of education: Not on file   Highest education level: Not on file  Occupational History   Not on file  Tobacco Use   Smoking status: Never   Smokeless tobacco: Never  Vaping Use   Vaping Use: Never used  Substance and Sexual Activity   Alcohol use: No    Alcohol/week: 0.0 standard drinks   Drug use: No   Sexual activity: Yes    Birth control/protection: Condom  Other Topics Concern   Not on file  Social History Narrative   Not on file   Social Determinants of Health   Financial Resource Strain: Not on file  Food Insecurity: Not on file  Transportation Needs: Not on file  Physical Activity: Not on file  Stress: Not on file  Social Connections: Not on file  Intimate Partner Violence: Not on file   Family History: No family history on file.  Review of Systems: Constitutional: Doesn't report fevers, chills or abnormal weight loss Eyes: Doesn't report blurriness of vision Ears, nose, mouth, throat, and face: Doesn't report sore throat Respiratory: Doesn't report cough, dyspnea or wheezes Cardiovascular: Doesn't report palpitation, chest discomfort  Gastrointestinal:  Doesn't report nausea, constipation, diarrhea GU: Doesn't report incontinence Skin: Doesn't report skin rashes Neurological: Per HPI Musculoskeletal: Doesn't report joint pain Behavioral/Psych: Doesn't report  anxiety  Physical Exam: Vitals:   11/29/20 0931  BP: 136/77  Pulse: 77  Resp: 18  Temp: 98.2 F (36.8 C)  SpO2: 100%    KPS: 90. General: Alert, cooperative, pleasant, in no acute distress Head: Normal EENT: No conjunctival injection or scleral icterus.  Lungs: Resp effort normal Cardiac: Regular rate Abdomen: Non-distended abdomen Skin: No rashes cyanosis or petechiae. Extremities: No clubbing or edema  Neurologic Exam: Mental Status: Awake, alert, attentive to examiner. Oriented to self and environment. Language is fluent with intact comprehension.  Cranial Nerves: Visual acuity is grossly normal. Visual fields are full. Extra-ocular movements intact. No ptosis. Face is symmetric Motor: Tone and bulk are normal. Power is full in both arms and legs. Reflexes are symmetric, no pathologic reflexes present.  Sensory: Intact to light touch Gait: Normal.   Labs: I have reviewed the data as listed    Component Value Date/Time   NA 139 11/01/2020 0951   K 4.4 11/01/2020  0951   CL 106 11/01/2020 0951   CO2 26 11/01/2020 0951   GLUCOSE 82 11/01/2020 0951   BUN 11 11/01/2020 0951   CREATININE 1.34 (H) 11/01/2020 0951   CREATININE 1.06 08/25/2020 1451   CALCIUM 9.7 11/01/2020 0951   PROT 7.4 11/01/2020 0951   ALBUMIN 4.6 11/01/2020 0951   AST 14 (L) 11/01/2020 0951   ALT 15 11/01/2020 0951   ALKPHOS 60 11/01/2020 0951   BILITOT 0.4 11/01/2020 0951   GFRNONAA >60 11/01/2020 0951   GFRAA  08/10/2009 2150    NOT CALCULATED        The eGFR has been calculated using the MDRD equation. This calculation has not been validated in all clinical situations. eGFR's persistently <60 mL/min signify possible Chronic Kidney Disease.   Lab Results  Component Value Date   WBC 4.1 11/01/2020   NEUTROABS 2.4 11/01/2020   HGB 14.0 11/01/2020   HCT 39.9 11/01/2020   MCV 88.1 11/01/2020   PLT 169 11/01/2020     Assessment/Plan 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[place])  Seizure ([place])  Dak Szumski [place] is clinically and radiographically stable today, now having completed cycle #4 of adjuvant Temodar.  MRI demonstrates continued stability of focus of contrast enhancement anterior to resection cavity.  MRI was reviewed during visit with Dr. Imagene Riches.  We recommended continuing treatment with cycle #5 Temozolomide at 200 mg/m2, on for five days and off for twenty three days in twenty eight day cycles. The patient will have a complete blood count performed on days 21 and 28 of each cycle, and a comprehensive metabolic panel performed on day 28 of each cycle. Labs may need to be performed more often. Compazine, rather than zofran will prescribed for home use for nausea/vomiting due to headaches this past month.   Chemotherapy should be held for the following:  ANC less than 1,000  Platelets less than 100,000  LFT or creatinine greater than 2x ULN  If clinical concerns/contraindications develop  If nausea continues to be an issue can consider zyprexa or ativan.   He will continue Keppra 726m BID.  Also on Ritalin now 546mBID.  AnKorver Graybealranford should return to clinic in 4 weeks with labs prior to cycle #6.  All questions were answered. The patient knows to call the clinic with any problems, questions or concerns. No barriers to learning were detected.  The total time spent in the encounter was 30 minutes and more than 50% was on counseling and review of test results   ZaVentura SellersMD Medical Director of Neuro-Oncology [facility] WeNavarro1/07/22 9:24 AM

## 2020-12-02 ENCOUNTER — Inpatient Hospital Stay (HOSPITAL_BASED_OUTPATIENT_CLINIC_OR_DEPARTMENT_OTHER): Payer: 59

## 2020-12-02 ENCOUNTER — Other Ambulatory Visit: Payer: Self-pay

## 2020-12-02 DIAGNOSIS — Z23 Encounter for immunization: Secondary | ICD-10-CM | POA: Diagnosis not present

## 2020-12-13 ENCOUNTER — Other Ambulatory Visit: Payer: Self-pay | Admitting: *Deleted

## 2020-12-13 ENCOUNTER — Ambulatory Visit
Admission: RE | Admit: 2020-12-13 | Discharge: 2020-12-13 | Disposition: A | Discharge status: Home / Self Care | Payer: Self-pay | Source: Ambulatory Visit | Attending: Internal Medicine | Admitting: Internal Medicine

## 2020-12-13 DIAGNOSIS — C719 Malignant neoplasm of brain, unspecified: Secondary | ICD-10-CM

## 2020-12-20 ENCOUNTER — Other Ambulatory Visit: Payer: Self-pay | Admitting: Internal Medicine

## 2020-12-20 DIAGNOSIS — C719 Malignant neoplasm of brain, unspecified: Secondary | ICD-10-CM

## 2020-12-23 ENCOUNTER — Other Ambulatory Visit: Payer: Self-pay | Admitting: Internal Medicine

## 2020-12-23 ENCOUNTER — Telehealth: Payer: Self-pay | Admitting: Internal Medicine

## 2020-12-23 ENCOUNTER — Inpatient Hospital Stay (HOSPITAL_BASED_OUTPATIENT_CLINIC_OR_DEPARTMENT_OTHER): Payer: 59 | Admitting: Internal Medicine

## 2020-12-23 ENCOUNTER — Other Ambulatory Visit: Payer: Self-pay

## 2020-12-23 ENCOUNTER — Inpatient Hospital Stay: Payer: 59 | Attending: Internal Medicine

## 2020-12-23 VITALS — BP 140/79 | HR 83 | Temp 99.1°F | Resp 20 | Wt 219.4 lb

## 2020-12-23 DIAGNOSIS — R569 Unspecified convulsions: Secondary | ICD-10-CM | POA: Insufficient documentation

## 2020-12-23 DIAGNOSIS — C719 Malignant neoplasm of brain, unspecified: Secondary | ICD-10-CM

## 2020-12-23 DIAGNOSIS — C711 Malignant neoplasm of frontal lobe: Secondary | ICD-10-CM | POA: Insufficient documentation

## 2020-12-23 DIAGNOSIS — Z79899 Other long term (current) drug therapy: Secondary | ICD-10-CM | POA: Diagnosis not present

## 2020-12-23 DIAGNOSIS — R110 Nausea: Secondary | ICD-10-CM | POA: Insufficient documentation

## 2020-12-23 DIAGNOSIS — R519 Headache, unspecified: Secondary | ICD-10-CM | POA: Insufficient documentation

## 2020-12-23 LAB — CBC WITH DIFFERENTIAL (CANCER CENTER ONLY)
Abs Immature Granulocytes: 0.01 10*3/uL (ref 0.00–0.07)
Basophils Absolute: 0 10*3/uL (ref 0.0–0.1)
Basophils Relative: 1 %
Eosinophils Absolute: 0.1 10*3/uL (ref 0.0–0.5)
Eosinophils Relative: 2 %
HCT: 43.2 % (ref 39.0–52.0)
Hemoglobin: 14.9 g/dL (ref 13.0–17.0)
Immature Granulocytes: 0 %
Lymphocytes Relative: 28 %
Lymphs Abs: 1 10*3/uL (ref 0.7–4.0)
MCH: 30.5 pg (ref 26.0–34.0)
MCHC: 34.5 g/dL (ref 30.0–36.0)
MCV: 88.3 fL (ref 80.0–100.0)
Monocytes Absolute: 0.3 10*3/uL (ref 0.1–1.0)
Monocytes Relative: 8 %
Neutro Abs: 2.1 10*3/uL (ref 1.7–7.7)
Neutrophils Relative %: 61 %
Platelet Count: 192 10*3/uL (ref 150–400)
RBC: 4.89 MIL/uL (ref 4.22–5.81)
RDW: 12.6 % (ref 11.5–15.5)
WBC Count: 3.4 10*3/uL — ABNORMAL LOW (ref 4.0–10.5)
nRBC: 0 % (ref 0.0–0.2)

## 2020-12-23 LAB — CMP (CANCER CENTER ONLY)
ALT: 36 U/L (ref 0–44)
AST: 23 U/L (ref 15–41)
Albumin: 4.8 g/dL (ref 3.5–5.0)
Alkaline Phosphatase: 62 U/L (ref 38–126)
Anion gap: 9 (ref 5–15)
BUN: 12 mg/dL (ref 6–20)
CO2: 25 mmol/L (ref 22–32)
Calcium: 9.7 mg/dL (ref 8.9–10.3)
Chloride: 105 mmol/L (ref 98–111)
Creatinine: 0.9 mg/dL (ref 0.61–1.24)
GFR, Estimated: >60 mL/min (ref >60)
Glucose, Bld: 92 mg/dL (ref 70–99)
Potassium: 4.5 mmol/L (ref 3.5–5.1)
Sodium: 139 mmol/L (ref 135–145)
Total Bilirubin: 0.6 mg/dL (ref 0.3–1.2)
Total Protein: 7.8 g/dL (ref 6.5–8.1)

## 2020-12-23 MED ORDER — TEMOZOLOMIDE 140 MG PO CAPS: 200.0000 mg/m2/d | ORAL_CAPSULE | Freq: Every day | ORAL | 0 refills | Status: DC

## 2020-12-23 NOTE — Progress Notes (Signed)
[facility] [place], [place] [address] [phone]   Interval Evaluation  Date of Service: 12/23/20 Patient Name: Gordon [name] Patient MRN: [id number] Patient DOB: 1995-05-08 Provider: Ventura Sellers, MD  Identifying Statement:  Gordon [name] is a 25 y.o. 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; path is grade 4 astrocytoma, IDH-1 mutant   04/20/2020 Surgery   Washout of surgical site collection; speciates MSSA   05/17/2020 Surgery   Repeat wound exploration, irrigation.   06/07/2020 -  Radiation Therapy   IMRT and concurrent Temodar 58m/m2   08/09/2020 -  Chemotherapy   Patient is on Treatment Plan : BRAIN GLIOBLASTOMA Consolidation Temozolomide Days 1-5 q28 Days        Biomarkers:  MGMT Unknown.  IDH 1/2 Mutated.  EGFR Unknown  TERT Unknown   Interval History:  ATayler Lassenpresents to clinic for follow up today, now having completed cycle #5 of adjuvant Temodar.  No clinical changes today, no recent seizures.  He does describe a bit more nausea this month with the compazine in place of zofran (headaches with zofran).  Continues to work bCatering manager  H+P (04/12/20) Patient presented to medical attention three weeks prior with episode of loss of awareness and generalized shaking consistent with seizure episode.  CNS imaging demonstrated an enhancing left frontal mass, which was resected on 04/04/20 by [name]  He tolerated surgery well, has no significant complaints at this time.  Last day of decadron taper is today.  He has been more or less independent at home, starting to resume some of his studies at school.  Currently a business student at [employer]  No other significant medical history.  Medications: Current  Outpatient Medications on File Prior to Visit  Medication Sig Dispense Refill   doxycycline (VIBRAMYCIN) 100 MG capsule Take 100 mg by mouth 2 (two) times daily.     levETIRAcetam (KEPPRA) 750 MG tablet Take 1 tablet (750 mg total) by mouth 2 (two) times daily. 60 tablet 5   methylphenidate (RITALIN) 5 MG tablet Take 10 mg by mouth 2 (two) times daily.     Multiple Vitamin (MULTIVITAMIN PO) Take 1 capsule by mouth daily.     Omega-3 Fatty Acids (OMEGA 3 PO) Take 1 capsule by mouth daily.     ondansetron (ZOFRAN) 8 MG tablet Take 1 tablet (8 mg total) by mouth 2 (two) times daily as needed (nausea and vomiting). May take 30-60 minutes prior to Temodar administration if nausea/vomiting occurs. (Patient not taking: Reported on 11/29/2020) 30 tablet 1   Probiotic Product (PROBIOTIC PO) Take 1 capsule by mouth daily.     prochlorperazine (COMPAZINE) 10 MG tablet Take 10 mg by mouth every 6 (six) hours as needed.     temozolomide (TEMODAR) 140 MG capsule Take 3 capsules (420 mg total) by mouth daily. May take on an empty stomach to decrease nausea & vomiting. (Patient not taking: Reported on 11/29/2020) 15 capsule 0   temozolomide (TEMODAR) 140 MG capsule TAKE 3 CAPSULES BY MOUTH  DAILY FOR 5 DAYS OF 28 DAY  CYCLE MAY TAKE ON AN EMPTY  STOMACH TO DECREASE NAUSEA  &amp; VOMITING. (Patient not taking: Reported on 11/29/2020) 15 capsule 0   temozolomide (TEMODAR) 140 MG capsule TAKE 3 CAPSULES  BY MOUTH AT  BEDTIME FOR 5 DAYS OF 28 DAY  CYCLE 15 capsule 0   No current facility-administered medications on file prior to visit.    Allergies: No Known Allergies Past Medical History:  Past Medical History:  Diagnosis Date   Bulging lumbar disc    Complication of anesthesia    PONV (postoperative nausea and vomiting)    Vomited x1 after craniotomy surgery   Seizure ([facility] [facility]) 04/03/2020   x 1 at home pre-admission.  Otherwise, no history.   Past Surgical History:  Past Surgical History:  Procedure Laterality  Date   BURR HOLE Right 04/20/2020   Procedure: Irrigation and Debridement of Cranial Wound;  Surgeon: [name], MD;  Location: [facility];  Service: Neurosurgery;  Laterality: Right;   CRANIOTOMY Left 04/04/2020   Procedure: LEFT CRANIOTOMY FOR TUMOR EXCISION;  Surgeon: [name], MD;  Location: [facility];  Service: Neurosurgery;  Laterality: Left;   TONSILLECTOMY     WISDOM TOOTH EXTRACTION     WOUND EXPLORATION Left 05/17/2020   Procedure: CRANIAL WOUND EXPLORATION WITH PLACEMENT OF MESH;  Surgeon: [name], MD;  Location: [facility];  Service: Neurosurgery;  Laterality: Left;   Social History:  Social History   Socioeconomic History   Marital status: Single    Spouse name: Not on file   Number of children: 0   Years of education: Not on file   Highest education level: Not on file  Occupational History   Not on file  Tobacco Use   Smoking status: Never   Smokeless tobacco: Never  Vaping Use   Vaping Use: Never used  Substance and Sexual Activity   Alcohol use: No    Alcohol/week: 0.0 standard drinks   Drug use: No   Sexual activity: Yes    Birth control/protection: Condom  Other Topics Concern   Not on file  Social History Narrative   Not on file   Social Determinants of Health   Financial Resource Strain: Not on file  Food Insecurity: Not on file  Transportation Needs: Not on file  Physical Activity: Not on file  Stress: Not on file  Social Connections: Not on file  Intimate Partner Violence: Not on file   Family History: No family history on file.  Review of Systems: Constitutional: Doesn't report fevers, chills or abnormal weight loss Eyes: Doesn't report blurriness of vision Ears, nose, mouth, throat, and face: Doesn't report sore throat Respiratory: Doesn't report cough, dyspnea or wheezes Cardiovascular: Doesn't report palpitation, chest discomfort  Gastrointestinal:  Doesn't report nausea, constipation, diarrhea GU: Doesn't report  incontinence Skin: Doesn't report skin rashes Neurological: Per HPI Musculoskeletal: Doesn't report joint pain Behavioral/Psych: Doesn't report anxiety  Physical Exam: There were no vitals filed for this visit.  KPS: 90. General: Alert, cooperative, pleasant, in no acute distress Head: Normal EENT: No conjunctival injection or scleral icterus.  Lungs: Resp effort normal Cardiac: Regular rate Abdomen: Non-distended abdomen Skin: No rashes cyanosis or petechiae. Extremities: No clubbing or edema  Neurologic Exam: Mental Status: Awake, alert, attentive to examiner. Oriented to self and environment. Language is fluent with intact comprehension.  Cranial Nerves: Visual acuity is grossly normal. Visual fields are full. Extra-ocular movements intact. No ptosis. Face is symmetric Motor: Tone and bulk are normal. Power is full in both arms and legs. Reflexes are symmetric, no pathologic reflexes present.  Sensory: Intact to light touch Gait: Normal.   Labs: I have reviewed the data as listed    Component  Value Date/Time   NA 139 11/29/2020 0904   K 4.5 11/29/2020 0904   CL 105 11/29/2020 0904   CO2 26 11/29/2020 0904   GLUCOSE 96 11/29/2020 0904   BUN 12 11/29/2020 0904   CREATININE 0.85 11/29/2020 0904   CREATININE 1.06 08/25/2020 1451   CALCIUM 9.7 11/29/2020 0904   PROT 7.4 11/29/2020 0904   ALBUMIN 4.5 11/29/2020 0904   AST 16 11/29/2020 0904   ALT 19 11/29/2020 0904   ALKPHOS 58 11/29/2020 0904   BILITOT 0.5 11/29/2020 0904   GFRNONAA >60 11/29/2020 0904   GFRAA  08/10/2009 2150    NOT CALCULATED        The eGFR has been calculated using the MDRD equation. This calculation has not been validated in all clinical situations. eGFR's persistently <60 mL/min signify possible Chronic Kidney Disease.   Lab Results  Component Value Date   WBC 4.1 11/29/2020   NEUTROABS 2.5 11/29/2020   HGB 14.4 11/29/2020   HCT 40.2 11/29/2020   MCV 85.9 11/29/2020   PLT 150  11/29/2020     Assessment/Plan 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HCC)  Seizure ([place])  Gordon [name] is clinically and radiographically stable today, now having completed cycle #5 of adjuvant Temodar.  No new or progressive changes today.  We recommended continuing treatment with cycle #6 Temozolomide at 200 mg/m2, on for five days and off for twenty three days in twenty eight day cycles. The patient will have a complete blood count performed on days 21 and 28 of each cycle, and a comprehensive metabolic panel performed on day 28 of each cycle. Labs may need to be performed more often. Compazine, rather than zofran will prescribed for home use for nausea/vomiting due to headaches this past month.   Chemotherapy should be held for the following:  ANC less than 1,000  Platelets less than 100,000  LFT or creatinine greater than 2x ULN  If clinical concerns/contraindications develop  If nausea continues to be an issue can consider zyprexa or ativan.   He will continue Keppra 745m BID.  Also on Ritalin now 571mBID.  AnBrennon [name] should return to clinic in 1 month with MRI brain for evaluation prior to cycle #7.  All questions were answered. The patient knows to call the clinic with any problems, questions or concerns. No barriers to learning were detected.  The total time spent in the encounter was 30 minutes and more than 50% was on counseling and review of test results   ZaVentura SellersMD Medical Director of Neuro-Oncology [facility]2/01/22 9:00 AM

## 2020-12-23 NOTE — Telephone Encounter (Signed)
Scheduled per 12/1 los, pt has been called and confirmed appt

## 2020-12-28 ENCOUNTER — Telehealth: Payer: Self-pay | Admitting: Internal Medicine

## 2020-12-28 NOTE — Telephone Encounter (Signed)
Scheduled per sch msg. Called and left msg  

## 2020-12-30 ENCOUNTER — Other Ambulatory Visit: Payer: Self-pay | Admitting: Internal Medicine

## 2020-12-30 ENCOUNTER — Encounter: Payer: Self-pay | Admitting: Internal Medicine

## 2020-12-30 MED ORDER — METOCLOPRAMIDE HCL 10 MG PO TABS: 10.0000 mg | ORAL_TABLET | Freq: Four times a day (QID) | ORAL | 1 refills | Status: DC | PRN

## 2021-01-18 ENCOUNTER — Inpatient Hospital Stay: Payer: 59

## 2021-01-18 ENCOUNTER — Inpatient Hospital Stay: Payer: 59 | Admitting: Internal Medicine

## 2021-01-20 ENCOUNTER — Inpatient Hospital Stay (HOSPITAL_BASED_OUTPATIENT_CLINIC_OR_DEPARTMENT_OTHER): Payer: 59 | Admitting: Internal Medicine

## 2021-01-20 ENCOUNTER — Inpatient Hospital Stay: Payer: 59

## 2021-01-20 ENCOUNTER — Ambulatory Visit (HOSPITAL_COMMUNITY)
Admission: RE | Admit: 2021-01-20 | Discharge: 2021-01-20 | Disposition: A | Discharge status: Home / Self Care | Payer: 59 | Source: Ambulatory Visit | Attending: Internal Medicine | Admitting: Internal Medicine

## 2021-01-20 ENCOUNTER — Other Ambulatory Visit: Payer: Self-pay

## 2021-01-20 VITALS — BP 145/78 | HR 100 | Temp 98.7°F | Resp 15 | Wt 220.8 lb

## 2021-01-20 DIAGNOSIS — C711 Malignant neoplasm of frontal lobe: Secondary | ICD-10-CM | POA: Diagnosis not present

## 2021-01-20 DIAGNOSIS — C719 Malignant neoplasm of brain, unspecified: Secondary | ICD-10-CM

## 2021-01-20 DIAGNOSIS — R569 Unspecified convulsions: Secondary | ICD-10-CM | POA: Diagnosis not present

## 2021-01-20 LAB — CMP (CANCER CENTER ONLY)
ALT: 35 U/L (ref 0–44)
AST: 22 U/L (ref 15–41)
Albumin: 4.7 g/dL (ref 3.5–5.0)
Alkaline Phosphatase: 54 U/L (ref 38–126)
Anion gap: 5 (ref 5–15)
BUN: 12 mg/dL (ref 6–20)
CO2: 29 mmol/L (ref 22–32)
Calcium: 9.7 mg/dL (ref 8.9–10.3)
Chloride: 106 mmol/L (ref 98–111)
Creatinine: 0.96 mg/dL (ref 0.61–1.24)
GFR, Estimated: >60 mL/min (ref >60)
Glucose, Bld: 72 mg/dL (ref 70–99)
Potassium: 4.3 mmol/L (ref 3.5–5.1)
Sodium: 140 mmol/L (ref 135–145)
Total Bilirubin: 0.6 mg/dL (ref 0.3–1.2)
Total Protein: 7.5 g/dL (ref 6.5–8.1)

## 2021-01-20 LAB — CBC WITH DIFFERENTIAL (CANCER CENTER ONLY)
Abs Immature Granulocytes: 0.02 10*3/uL (ref 0.00–0.07)
Basophils Absolute: 0 10*3/uL (ref 0.0–0.1)
Basophils Relative: 0 %
Eosinophils Absolute: 0.1 10*3/uL (ref 0.0–0.5)
Eosinophils Relative: 1 %
HCT: 41.5 % (ref 39.0–52.0)
Hemoglobin: 14.4 g/dL (ref 13.0–17.0)
Immature Granulocytes: 0 %
Lymphocytes Relative: 9 %
Lymphs Abs: 0.7 10*3/uL (ref 0.7–4.0)
MCH: 30.5 pg (ref 26.0–34.0)
MCHC: 34.7 g/dL (ref 30.0–36.0)
MCV: 87.9 fL (ref 80.0–100.0)
Monocytes Absolute: 0.6 10*3/uL (ref 0.1–1.0)
Monocytes Relative: 8 %
Neutro Abs: 6.3 10*3/uL (ref 1.7–7.7)
Neutrophils Relative %: 82 %
Platelet Count: 203 10*3/uL (ref 150–400)
RBC: 4.72 MIL/uL (ref 4.22–5.81)
RDW: 12.5 % (ref 11.5–15.5)
WBC Count: 7.6 10*3/uL (ref 4.0–10.5)
nRBC: 0 % (ref 0.0–0.2)

## 2021-01-20 MED ORDER — TEMOZOLOMIDE 140 MG PO CAPS: 200.0000 mg/m2/d | ORAL_CAPSULE | Freq: Every day | ORAL | 0 refills | Status: DC

## 2021-01-20 MED ORDER — GADOBUTROL 1 MMOL/ML IV SOLN
10.0000 mL | Freq: Once | INTRAVENOUS | Status: AC | PRN
  Administered 2021-01-20: 10:00:00 10 mL via INTRAVENOUS

## 2021-01-20 NOTE — Progress Notes (Signed)
[facility] at [place], [place] [address] [phone]   Interval Evaluation  Date of Service: 01/20/21 Patient Name: Gordon [name] Patient MRN: [id number] Patient DOB: [birth year] Provider: Ventura Sellers, MD  Identifying Statement:  Gordon [name] is Gordon 25 y.o. male with left frontal  grade 4 astrocytoma    Oncologic History: Oncology History  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[facility])  04/04/2020 Surgery   Craniotomy, resection of left frontal mass with [name]; path is grade 4 astrocytoma, IDH-1 mutant   04/20/2020 Surgery   Washout of surgical site collection; speciates MSSA   05/17/2020 Surgery   Repeat wound exploration, irrigation.   06/07/2020 -  Radiation Therapy   IMRT and concurrent Temodar 60m/m2   08/09/2020 -  Chemotherapy   Patient is on Treatment Plan : BRAIN GLIOBLASTOMA Consolidation Temozolomide Days 1-5 q28 Days        Biomarkers:  MGMT Unknown.  IDH 1/2 Mutated.  EGFR Unknown  TERT Unknown   Interval History:  Gordon Baldersonpresents to clinic for follow up today, now having completed cycle #6 of adjuvant Temodar.  Denies any new symptoms, no seizures.  Nausea better managed with reglan and compazine.  Continues to work bCatering manager  H+P (04/12/20) Patient presented to medical attention three weeks prior with episode of loss of awareness and generalized shaking consistent with seizure episode.  CNS imaging demonstrated Gordon enhancing left frontal mass, which was resected on 04/04/20 by [name]  He tolerated surgery well, has no significant complaints at this time.  Last day of decadron taper is today.  He has been more or less independent at home, starting to resume some of his studies at school.  Currently Gordon business student at [employer]  No other significant medical history.  Medications: Current Outpatient Medications on File Prior to Visit  Medication Sig Dispense  Refill   doxycycline (VIBRAMYCIN) 100 MG capsule Take 100 mg by mouth 2 (two) times daily.     levETIRAcetam (KEPPRA) 750 MG tablet Take 1 tablet (750 mg total) by mouth 2 (two) times daily. 60 tablet 5   methylphenidate (RITALIN) 5 MG tablet Take 10 mg by mouth 2 (two) times daily.     metoCLOPramide (REGLAN) 10 MG tablet Take 1 tablet (10 mg total) by mouth every 6 (six) hours as needed for nausea. 60 tablet 1   Multiple Vitamin (MULTIVITAMIN PO) Take 1 capsule by mouth daily.     Omega-3 Fatty Acids (OMEGA 3 PO) Take 1 capsule by mouth daily.     ondansetron (ZOFRAN) 8 MG tablet Take 1 tablet (8 mg total) by mouth 2 (two) times daily as needed (nausea and vomiting). May take 30-60 minutes prior to Temodar administration if nausea/vomiting occurs. 30 tablet 1   Probiotic Product (PROBIOTIC PO) Take 1 capsule by mouth daily.     prochlorperazine (COMPAZINE) 10 MG tablet Take 10 mg by mouth every 6 (six) hours as needed.     temozolomide (TEMODAR) 140 MG capsule Take 3 capsules (420 mg total) by mouth daily. May take on Gordon empty stomach to decrease nausea & vomiting. 15 capsule 0   temozolomide (TEMODAR) 140 MG capsule TAKE 3 CAPSULES BY MOUTH  DAILY FOR 5 DAYS OF 28 DAY  CYCLE MAY TAKE ON Gordon EMPTY  STOMACH TO DECREASE NAUSEA  &amp; VOMITING. 15 capsule 0   temozolomide (TEMODAR) 140 MG capsule TAKE 3 CAPSULES BY MOUTH AT  BEDTIME  FOR 5 DAYS OF 28 DAY  CYCLE 15 capsule 0   temozolomide (TEMODAR) 140 MG capsule Take 3 capsules (420 mg total) by mouth at bedtime. 15 capsule 0   No current facility-administered medications on file prior to visit.    Allergies: No Known Allergies Past Medical History:  Past Medical History:  Diagnosis Date   Bulging lumbar disc    Complication of anesthesia    PONV (postoperative nausea and vomiting)    Vomited x1 after craniotomy surgery   Seizure ([place]) 04/03/2020   x 1 at home pre-admission.  Otherwise, no history.   Past Surgical History:  Past Surgical  History:  Procedure Laterality Date   BURR HOLE Right 04/20/2020   Procedure: Irrigation and Debridement of Cranial Wound;  Surgeon: [name], MD;  Location: [facility];  Service: Neurosurgery;  Laterality: Right;   CRANIOTOMY Left 04/04/2020   Procedure: LEFT CRANIOTOMY FOR TUMOR EXCISION;  Surgeon: [name], MD;  Location: [facility];  Service: Neurosurgery;  Laterality: Left;   TONSILLECTOMY     WISDOM TOOTH EXTRACTION     WOUND EXPLORATION Left 05/17/2020   Procedure: CRANIAL WOUND EXPLORATION WITH PLACEMENT OF MESH;  Surgeon: [name], MD;  Location: [facility];  Service: Neurosurgery;  Laterality: Left;   Social History:  Social History   Socioeconomic History   Marital status: Single    Spouse name: Not on file   Number of children: 0   Years of education: Not on file   Highest education level: Not on file  Occupational History   Not on file  Tobacco Use   Smoking status: Never   Smokeless tobacco: Never  Vaping Use   Vaping Use: Never used  Substance and Sexual Activity   Alcohol use: No    Alcohol/week: 0.0 standard drinks   Drug use: No   Sexual activity: Yes    Birth control/protection: Condom  Other Topics Concern   Not on file  Social History Narrative   Not on file   Social Determinants of Health   Financial Resource Strain: Not on file  Food Insecurity: Not on file  Transportation Needs: Not on file  Physical Activity: Not on file  Stress: Not on file  Social Connections: Not on file  Intimate Partner Violence: Not on file   Family History: No family history on file.  Review of Systems: Constitutional: Doesn't report fevers, chills or abnormal weight loss Eyes: Doesn't report blurriness of vision Ears, nose, mouth, throat, and face: Doesn't report sore throat Respiratory: Doesn't report cough, dyspnea or wheezes Cardiovascular: Doesn't report palpitation, chest discomfort  Gastrointestinal:  Doesn't report nausea, constipation,  diarrhea GU: Doesn't report incontinence Skin: Doesn't report skin rashes Neurological: Per HPI Musculoskeletal: Doesn't report joint pain Behavioral/Psych: Doesn't report anxiety  Physical Exam: Vitals:   01/20/21 1223  BP: (!) 145/78  Pulse: 100  Resp: 15  Temp: 98.7 F (37.1 C)  SpO2: 100%    KPS: 90. General: Alert, cooperative, pleasant, in no acute distress Head: Normal EENT: No conjunctival injection or scleral icterus.  Lungs: Resp effort normal Cardiac: Regular rate Abdomen: Non-distended abdomen Skin: No rashes cyanosis or petechiae. Extremities: No clubbing or edema  Neurologic Exam: Mental Status: Awake, alert, attentive to examiner. Oriented to self and environment. Language is fluent with intact comprehension.  Cranial Nerves: Visual acuity is grossly normal. Visual fields are full. Extra-ocular movements intact. No ptosis. Face is symmetric Motor: Tone and bulk are normal. Power is full in  both arms and legs. Reflexes are symmetric, no pathologic reflexes present.  Sensory: Intact to light touch Gait: Normal.   Labs: I have reviewed the data as listed    Component Value Date/Time   NA 139 12/23/2020 0851   K 4.5 12/23/2020 0851   CL 105 12/23/2020 0851   CO2 25 12/23/2020 0851   GLUCOSE 92 12/23/2020 0851   BUN 12 12/23/2020 0851   CREATININE 0.90 12/23/2020 0851   CREATININE 1.06 08/25/2020 1451   CALCIUM 9.7 12/23/2020 0851   PROT 7.8 12/23/2020 0851   ALBUMIN 4.8 12/23/2020 0851   AST 23 12/23/2020 0851   ALT 36 12/23/2020 0851   ALKPHOS 62 12/23/2020 0851   BILITOT 0.6 12/23/2020 0851   GFRNONAA >60 12/23/2020 0851   GFRAA  08/10/2009 2150    NOT CALCULATED        The eGFR has been calculated using the MDRD equation. This calculation has not been validated in all clinical situations. eGFR's persistently <60 mL/min signify possible Chronic Kidney Disease.   Lab Results  Component Value Date   WBC 7.6 01/20/2021   NEUTROABS 6.3  01/20/2021   HGB 14.4 01/20/2021   HCT 41.5 01/20/2021   MCV 87.9 01/20/2021   PLT 203 01/20/2021   Imaging:  [facility] Clinician Interpretation: I have personally reviewed the CNS images as listed.  My interpretation, in the context of the patient's clinical presentation, is stable disease  Pending official read  Assessment/Plan 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[place])  Seizure ([place])  Gordon [name] is clinically and radiographically stable today, now having completed cycle #6 of adjuvant Temodar.  Labs are within normal limits.  We recommended continuing treatment with cycle #7 Temozolomide at 200 mg/m2, on for five days and off for twenty three days in twenty eight day cycles. The patient will have Gordon complete blood count performed on days 21 and 28 of each cycle, and Gordon comprehensive metabolic panel performed on day 28 of each cycle. Labs may need to be performed more often. Compazine, rather than zofran will prescribed for home use for nausea/vomiting due to headaches this past month.   Chemotherapy should be held for the following:  ANC less than 1,000  Platelets less than 100,000  LFT or creatinine greater than 2x ULN  If clinical concerns/contraindications develop  Will con't reglan+compazine for nausea.  He will continue Keppra 768m BID.  Also on Ritalin now 543mBID.  AnNajeh Creditranford should return to clinic in 1 month with labs for evaluation prior to cycle #8.  All questions were answered. The patient knows to call the clinic with any problems, questions or concerns. No barriers to learning were detected.  The total time spent in the encounter was 40 minutes and more than 50% was on counseling and review of test results   ZaVentura SellersMD Medical Director of Neuro-Oncology [facility]2/29/22 12:28 PM

## 2021-01-21 ENCOUNTER — Telehealth: Payer: Self-pay | Admitting: Internal Medicine

## 2021-01-21 NOTE — Telephone Encounter (Signed)
Scheduled per 12/29 los, pt has been called and mother confirmed appt

## 2021-01-25 ENCOUNTER — Other Ambulatory Visit: Payer: 59

## 2021-01-25 ENCOUNTER — Ambulatory Visit: Payer: 59 | Admitting: Internal Medicine

## 2021-01-27 ENCOUNTER — Encounter: Payer: Self-pay | Admitting: Internal Medicine

## 2021-01-27 ENCOUNTER — Ambulatory Visit: Payer: 59 | Admitting: Internal Medicine

## 2021-01-27 ENCOUNTER — Other Ambulatory Visit: Payer: 59

## 2021-01-27 ENCOUNTER — Other Ambulatory Visit: Payer: Self-pay | Admitting: *Deleted

## 2021-01-27 DIAGNOSIS — C719 Malignant neoplasm of brain, unspecified: Secondary | ICD-10-CM

## 2021-01-31 ENCOUNTER — Inpatient Hospital Stay: Payer: 59

## 2021-02-10 ENCOUNTER — Other Ambulatory Visit: Payer: Self-pay | Admitting: Internal Medicine

## 2021-02-10 DIAGNOSIS — C719 Malignant neoplasm of brain, unspecified: Secondary | ICD-10-CM

## 2021-02-17 ENCOUNTER — Inpatient Hospital Stay: Payer: 59 | Admitting: Internal Medicine

## 2021-02-17 ENCOUNTER — Inpatient Hospital Stay: Payer: 59 | Attending: Internal Medicine

## 2021-02-17 ENCOUNTER — Other Ambulatory Visit: Payer: Self-pay

## 2021-02-17 ENCOUNTER — Other Ambulatory Visit: Payer: Self-pay | Admitting: *Deleted

## 2021-02-17 VITALS — BP 134/79 | HR 92 | Temp 97.6°F | Resp 16 | Ht 75.0 in | Wt 222.2 lb

## 2021-02-17 DIAGNOSIS — Z79899 Other long term (current) drug therapy: Secondary | ICD-10-CM | POA: Diagnosis not present

## 2021-02-17 DIAGNOSIS — R569 Unspecified convulsions: Secondary | ICD-10-CM | POA: Diagnosis not present

## 2021-02-17 DIAGNOSIS — C719 Malignant neoplasm of brain, unspecified: Secondary | ICD-10-CM

## 2021-02-17 DIAGNOSIS — C711 Malignant neoplasm of frontal lobe: Secondary | ICD-10-CM | POA: Insufficient documentation

## 2021-02-17 LAB — CBC WITH DIFFERENTIAL (CANCER CENTER ONLY)
Abs Immature Granulocytes: 0.02 10*3/uL (ref 0.00–0.07)
Basophils Absolute: 0 10*3/uL (ref 0.0–0.1)
Basophils Relative: 1 %
Eosinophils Absolute: 0.1 10*3/uL (ref 0.0–0.5)
Eosinophils Relative: 1 %
HCT: 41.7 % (ref 39.0–52.0)
Hemoglobin: 15 g/dL (ref 13.0–17.0)
Immature Granulocytes: 0 %
Lymphocytes Relative: 16 %
Lymphs Abs: 0.8 10*3/uL (ref 0.7–4.0)
MCH: 31.1 pg (ref 26.0–34.0)
MCHC: 36 g/dL (ref 30.0–36.0)
MCV: 86.5 fL (ref 80.0–100.0)
Monocytes Absolute: 0.7 10*3/uL (ref 0.1–1.0)
Monocytes Relative: 14 %
Neutro Abs: 3.5 10*3/uL (ref 1.7–7.7)
Neutrophils Relative %: 68 %
Platelet Count: 158 10*3/uL (ref 150–400)
RBC: 4.82 MIL/uL (ref 4.22–5.81)
RDW: 12.7 % (ref 11.5–15.5)
WBC Count: 5 10*3/uL (ref 4.0–10.5)
nRBC: 0 % (ref 0.0–0.2)

## 2021-02-17 LAB — CMP (CANCER CENTER ONLY)
ALT: 25 U/L (ref 0–44)
AST: 18 U/L (ref 15–41)
Albumin: 4.9 g/dL (ref 3.5–5.0)
Alkaline Phosphatase: 56 U/L (ref 38–126)
Anion gap: 6 (ref 5–15)
BUN: 14 mg/dL (ref 6–20)
CO2: 28 mmol/L (ref 22–32)
Calcium: 10.1 mg/dL (ref 8.9–10.3)
Chloride: 104 mmol/L (ref 98–111)
Creatinine: 0.99 mg/dL (ref 0.61–1.24)
GFR, Estimated: >60 mL/min (ref >60)
Glucose, Bld: 94 mg/dL (ref 70–99)
Potassium: 4.5 mmol/L (ref 3.5–5.1)
Sodium: 138 mmol/L (ref 135–145)
Total Bilirubin: 0.9 mg/dL (ref 0.3–1.2)
Total Protein: 7.9 g/dL (ref 6.5–8.1)

## 2021-02-17 MED ORDER — TEMOZOLOMIDE 140 MG PO CAPS: 200.0000 mg/m2/d | ORAL_CAPSULE | Freq: Every day | ORAL | 0 refills | Status: DC

## 2021-02-17 NOTE — Progress Notes (Signed)
[facility] [place], [place] [address] [phone]   Interval Evaluation  Date of Service: 02/17/21 Patient Name: Gordon [name] Patient MRN: [id number] Patient DOB: 1995-06-09 Provider: Ventura Sellers, MD  Identifying Statement:  Gordon [name] is a 26 y.o. male with left frontal  grade 4 astrocytoma    Oncologic History: Oncology History  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[place])  04/04/2020 Surgery   Craniotomy, resection of left frontal mass with [name]; path is grade 4 astrocytoma, IDH-1 mutant   04/20/2020 Surgery   Washout of surgical site collection; speciates MSSA   05/17/2020 Surgery   Repeat wound exploration, irrigation.   06/07/2020 -  Radiation Therapy   IMRT and concurrent Temodar 41m/m2   08/09/2020 -  Chemotherapy   Patient is on Treatment Plan : BRAIN GLIOBLASTOMA Consolidation Temozolomide Days 1-5 q28 Days        Biomarkers:  MGMT Unknown.  IDH 1/2 Mutated.  EGFR Unknown  TERT Unknown   Interval History:  AEllwyn [name] to clinic for follow up today, now having completed cycle #7 of adjuvant Temodar.  Tolerated therapy well this month, no new issues.  Nausea still managed with reglan and compazine.  Continues to work bCatering manager  H+P (04/12/20) Patient presented to medical attention three weeks prior with episode of loss of awareness and generalized shaking consistent with seizure episode.  CNS imaging demonstrated an enhancing left frontal mass, which was resected on 04/04/20 by [name]  He tolerated surgery well, has no significant complaints at this time.  Last day of decadron taper is today.  He has been more or less independent at home, starting to resume some of his studies at school.  Currently a business student at [employer]  No other significant medical history.  Medications: Current Outpatient Medications on File Prior to Visit  Medication  Sig Dispense Refill   doxycycline (VIBRAMYCIN) 100 MG capsule Take 100 mg by mouth 2 (two) times daily.     levETIRAcetam (KEPPRA) 750 MG tablet Take 1 tablet (750 mg total) by mouth 2 (two) times daily. 60 tablet 5   methylphenidate (RITALIN) 5 MG tablet Take 10 mg by mouth 2 (two) times daily.     metoCLOPramide (REGLAN) 10 MG tablet Take 1 tablet (10 mg total) by mouth every 6 (six) hours as needed for nausea. 60 tablet 1   Multiple Vitamin (MULTIVITAMIN PO) Take 1 capsule by mouth daily.     Omega-3 Fatty Acids (OMEGA 3 PO) Take 1 capsule by mouth daily.     ondansetron (ZOFRAN) 8 MG tablet Take 1 tablet (8 mg total) by mouth 2 (two) times daily as needed (nausea and vomiting). May take 30-60 minutes prior to Temodar administration if nausea/vomiting occurs. 30 tablet 1   Probiotic Product (PROBIOTIC PO) Take 1 capsule by mouth daily.     prochlorperazine (COMPAZINE) 10 MG tablet Take 10 mg by mouth every 6 (six) hours as needed.     temozolomide (TEMODAR) 140 MG capsule TAKE 3 CAPSULES BY MOUTH DAILY  FOR 5 DAYS OF 28 DAY CYCLE . MAY TAKE ON AN EMPTY STOMACH TO  DECREASE NAUSEA AND VOMITING. 15 capsule 0   No current facility-administered medications on file prior to visit.    Allergies: No Known Allergies Past Medical History:  Past Medical History:  Diagnosis Date   Bulging lumbar disc    Complication of anesthesia    PONV (postoperative  nausea and vomiting)    Vomited x1 after craniotomy surgery   Seizure ([place]) 04/03/2020   x 1 at home pre-admission.  Otherwise, no history.   Past Surgical History:  Past Surgical History:  Procedure Laterality Date   BURR HOLE Right 04/20/2020   Procedure: Irrigation and Debridement of Cranial Wound;  Surgeon: [name], MD;  Location: [facility];  Service: Neurosurgery;  Laterality: Right;   CRANIOTOMY Left 04/04/2020   Procedure: LEFT CRANIOTOMY FOR TUMOR EXCISION;  Surgeon: [name], MD;  Location: [facility];  Service:  Neurosurgery;  Laterality: Left;   TONSILLECTOMY     WISDOM TOOTH EXTRACTION     WOUND EXPLORATION Left 05/17/2020   Procedure: CRANIAL WOUND EXPLORATION WITH PLACEMENT OF MESH;  Surgeon: [name], MD;  Location: [facility];  Service: Neurosurgery;  Laterality: Left;   Social History:  Social History   Socioeconomic History   Marital status: Single    Spouse name: Not on file   Number of children: 0   Years of education: Not on file   Highest education level: Not on file  Occupational History   Not on file  Tobacco Use   Smoking status: Never   Smokeless tobacco: Never  Vaping Use   Vaping Use: Never used  Substance and Sexual Activity   Alcohol use: No    Alcohol/week: 0.0 standard drinks   Drug use: No   Sexual activity: Yes    Birth control/protection: Condom  Other Topics Concern   Not on file  Social History Narrative   Not on file   Social Determinants of Health   Financial Resource Strain: Not on file  Food Insecurity: Not on file  Transportation Needs: Not on file  Physical Activity: Not on file  Stress: Not on file  Social Connections: Not on file  Intimate Partner Violence: Not on file   Family History: No family history on file.  Review of Systems: Constitutional: Doesn't report fevers, chills or abnormal weight loss Eyes: Doesn't report blurriness of vision Ears, nose, mouth, throat, and face: Doesn't report sore throat Respiratory: Doesn't report cough, dyspnea or wheezes Cardiovascular: Doesn't report palpitation, chest discomfort  Gastrointestinal:  Doesn't report nausea, constipation, diarrhea GU: Doesn't report incontinence Skin: Doesn't report skin rashes Neurological: Per HPI Musculoskeletal: Doesn't report joint pain Behavioral/Psych: Doesn't report anxiety  Physical Exam: Vitals:   02/17/21 0923  BP: 134/79  Pulse: 92  Resp: 16  Temp: 97.6 F (36.4 C)  SpO2: 99%    KPS: 90. General: Alert, cooperative, pleasant, in no  acute distress Head: Normal EENT: No conjunctival injection or scleral icterus.  Lungs: Resp effort normal Cardiac: Regular rate Abdomen: Non-distended abdomen Skin: No rashes cyanosis or petechiae. Extremities: No clubbing or edema  Neurologic Exam: Mental Status: Awake, alert, attentive to examiner. Oriented to self and environment. Language is fluent with intact comprehension.  Cranial Nerves: Visual acuity is grossly normal. Visual fields are full. Extra-ocular movements intact. No ptosis. Face is symmetric Motor: Tone and bulk are normal. Power is full in both arms and legs. Reflexes are symmetric, no pathologic reflexes present.  Sensory: Intact to light touch Gait: Normal.   Labs: I have reviewed the data as listed    Component Value Date/Time   NA 138 02/17/2021 0903   K 4.5 02/17/2021 0903   CL 104 02/17/2021 0903   CO2 28 02/17/2021 0903   GLUCOSE 94 02/17/2021 0903   BUN 14 02/17/2021 0903   CREATININE 0.99 02/17/2021  3267   CREATININE 1.06 08/25/2020 1451   CALCIUM 10.1 02/17/2021 0903   PROT 7.9 02/17/2021 0903   ALBUMIN 4.9 02/17/2021 0903   AST 18 02/17/2021 0903   ALT 25 02/17/2021 0903   ALKPHOS 56 02/17/2021 0903   BILITOT 0.9 02/17/2021 0903   GFRNONAA >60 02/17/2021 0903   GFRAA  08/10/2009 2150    NOT CALCULATED        The eGFR has been calculated using the MDRD equation. This calculation has not been validated in all clinical situations. eGFR's persistently <60 mL/min signify possible Chronic Kidney Disease.   Lab Results  Component Value Date   WBC 5.0 02/17/2021   NEUTROABS 3.5 02/17/2021   HGB 15.0 02/17/2021   HCT 41.7 02/17/2021   MCV 86.5 02/17/2021   PLT 158 02/17/2021    Assessment/Plan Seizure ([place])  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[place])  Gordon [name] is clinically stable today, now having completed cycle #7 of adjuvant Temodar.  No new or progressive deficits.  We recommended continuing treatment with cycle #8 Temozolomide at  200 mg/m2, on for five days and off for twenty three days in twenty eight day cycles. The patient will have a complete blood count performed on days 21 and 28 of each cycle, and a comprehensive metabolic panel performed on day 28 of each cycle. Labs may need to be performed more often. Compazine, rather than zofran will prescribed for home use for nausea/vomiting due to headaches this past month.   Chemotherapy should be held for the following:  ANC less than 1,000  Platelets less than 100,000  LFT or creatinine greater than 2x ULN  If clinical concerns/contraindications develop  Will con't reglan+compazine for nausea.  He will continue Keppra 743m BID.  Also on Ritalin now 54mBID.  AnGaberiel [name] should return to clinic in 1 month with MRI brain for evaluation prior to cycle #9.  All questions were answered. The patient knows to call the clinic with any problems, questions or concerns. No barriers to learning were detected.  The total time spent in the encounter was 30 minutes and more than 50% was on counseling and review of test results   ZaVentura SellersMD Medical Director of Neuro-Oncology [facility]1/26/23 9:51 AM

## 2021-02-24 ENCOUNTER — Encounter: Payer: Self-pay | Admitting: Internal Medicine

## 2021-02-24 ENCOUNTER — Ambulatory Visit: Payer: 59 | Admitting: Internal Medicine

## 2021-02-24 ENCOUNTER — Other Ambulatory Visit: Payer: Self-pay

## 2021-02-24 VITALS — BP 125/73 | HR 60 | Temp 97.9°F | Ht 76.0 in | Wt 223.0 lb

## 2021-02-24 DIAGNOSIS — M869 Osteomyelitis, unspecified: Secondary | ICD-10-CM

## 2021-02-24 DIAGNOSIS — M8618 Other acute osteomyelitis, other site: Secondary | ICD-10-CM

## 2021-02-24 DIAGNOSIS — T847XXD Infection and inflammatory reaction due to other internal orthopedic prosthetic devices, implants and grafts, subsequent encounter: Secondary | ICD-10-CM | POA: Diagnosis not present

## 2021-02-24 NOTE — Patient Instructions (Signed)
Thank you for coming to see me today. It was a pleasure seeing you.  To Do: Continue taking doxycycline as prescribed Follow up with Dr Gale Journey in about 3 months Labs today.  If you have any questions or concerns, please do not hesitate to call the office at [phone].  Take Care,   Jule Ser

## 2021-02-24 NOTE — Progress Notes (Signed)
[facility] for [facility]  CHIEF COMPLAINT:    Follow up for osteomyelitis  SUBJECTIVE:    Gordon [name] is a 26 y.o. male with PMHx as below who presents to the clinic for osteomyelitis.   Patient has history of grade 4 astrocytoma brain tumor s/p exicision complicated by surgical site infection and OM with initial cultures growing MSSA, P acnes).  He had another I&D in April 2022 and titanium mesh placement the same date where cultures grew MRSE and MSSA.  He completed 3 months of rifampin/minocycline for surgical site related infection and OM of the craniotomy site.  Seen in August and transitioned from rifampin/minocycline to doxycycline suppression which he was doing well on when seen again in Nov 2022.  He is here today for his routine follow up.   He continues to feel well without any complaints today.  Tolerating doxycycline and no issues.   Please see A&P for the details of today's visit and status of the patient's medical problems.   Patient's Medications  New Prescriptions   No medications on file  Previous Medications   DOXYCYCLINE (VIBRAMYCIN) 100 MG CAPSULE    Take 100 mg by mouth 2 (two) times daily.   LEVETIRACETAM (KEPPRA) 750 MG TABLET    Take 1 tablet (750 mg total) by mouth 2 (two) times daily.   METHYLPHENIDATE (RITALIN) 5 MG TABLET    Take 10 mg by mouth 2 (two) times daily.   METOCLOPRAMIDE (REGLAN) 10 MG TABLET    Take 1 tablet (10 mg total) by mouth every 6 (six) hours as needed for nausea.   MULTIPLE VITAMIN (MULTIVITAMIN PO)    Take 1 capsule by mouth daily.   OMEGA-3 FATTY ACIDS (OMEGA 3 PO)    Take 1 capsule by mouth daily.   ONDANSETRON (ZOFRAN) 8 MG TABLET    Take 1 tablet (8 mg total) by mouth 2 (two) times daily as needed (nausea and vomiting). May take 30-60 minutes prior to Temodar administration if nausea/vomiting occurs.   PROBIOTIC PRODUCT (PROBIOTIC PO)    Take 1 capsule by mouth daily.   PROCHLORPERAZINE (COMPAZINE)  10 MG TABLET    Take 10 mg by mouth every 6 (six) hours as needed.   TEMOZOLOMIDE (TEMODAR) 140 MG CAPSULE    TAKE 3 CAPSULES BY MOUTH DAILY  FOR 5 DAYS OF 28 DAY CYCLE . MAY TAKE ON AN EMPTY STOMACH TO  DECREASE NAUSEA AND VOMITING.   TEMOZOLOMIDE (TEMODAR) 140 MG CAPSULE    Take 3 capsules (420 mg total) by mouth at bedtime.  Modified Medications   No medications on file  Discontinued Medications   No medications on file      Past Medical History:  Diagnosis Date   Bulging lumbar disc    Complication of anesthesia    PONV (postoperative nausea and vomiting)    Vomited x1 after craniotomy surgery   Seizure ([place]) 04/03/2020   x 1 at home pre-admission.  Otherwise, no history.    Social History   Tobacco Use   Smoking status: Never   Smokeless tobacco: Never  Vaping Use   Vaping Use: Never used  Substance Use Topics   Alcohol use: No    Alcohol/week: 0.0 standard drinks   Drug use: No    No family history on file.  No Known Allergies  Review of Systems  Constitutional: Negative.   Respiratory: Negative.    Cardiovascular: Negative.   Gastrointestinal: Negative.  Skin: Negative.     OBJECTIVE:    Vitals:   02/24/21 1010  BP: 125/73  Pulse: 60  Temp: 97.9 F (36.6 C)  Weight: 223 lb (101.2 kg)  Height: 6\' 4"  (1.93 m)   Body mass index is 27.14 kg/m.  Physical Exam Constitutional:      General: He is not in acute distress.    Appearance: Normal appearance.  HENT:     Head:     Comments: Well healed scar.  Pulmonary:     Effort: Pulmonary effort is normal. No respiratory distress.  Skin:    General: Skin is warm and dry.  Neurological:     General: No focal deficit present.     Mental Status: He is alert and oriented to person, place, and time.  Psychiatric:        Mood and Affect: Mood normal.        Behavior: Behavior normal.     Labs and Microbiology: CBC Latest Ref Rng & Units 02/17/2021 01/20/2021 12/23/2020  WBC 4.0 - 10.5 K/uL 5.0 7.6  3.4(L)  Hemoglobin 13.0 - 17.0 g/dL 15.0 14.4 14.9  Hematocrit 39.0 - 52.0 % 41.7 41.5 43.2  Platelets 150 - 400 K/uL 158 203 192   CMP Latest Ref Rng & Units 02/17/2021 01/20/2021 12/23/2020  Glucose 70 - 99 mg/dL 94 72 92  BUN 6 - 20 mg/dL 14 12 12   Creatinine 0.61 - 1.24 mg/dL 0.99 0.96 0.90  Sodium 135 - 145 mmol/L 138 140 139  Potassium 3.5 - 5.1 mmol/L 4.5 4.3 4.5  Chloride 98 - 111 mmol/L 104 106 105  CO2 22 - 32 mmol/L 28 29 25   Calcium 8.9 - 10.3 mg/dL 10.1 9.7 9.7  Total Protein 6.5 - 8.1 g/dL 7.9 7.5 7.8  Total Bilirubin 0.3 - 1.2 mg/dL 0.9 0.6 0.6  Alkaline Phos 38 - 126 U/L 56 54 62  AST 15 - 41 U/L 18 22 23   ALT 0 - 44 U/L 25 35 36     No results found for this or any previous visit (from the past 240 hour(s)).     ASSESSMENT & PLAN:    1. Hardware complicating wound infection  2. Osteomyelitis  Patient is doing well on doxycycline 100mg  BID for suppression.  Discussed again option of continuing vs monitoring clinically off antibiotics and monitoring CRP.  Plan to continue doxycycline for now.  Will check CRP and follow up with Dr Gale Journey in 3 months.    Raynelle Highland for Infectious Disease [facility] 02/24/2021, 10:25 AM

## 2021-02-25 ENCOUNTER — Telehealth: Payer: Self-pay

## 2021-02-25 ENCOUNTER — Ambulatory Visit: Payer: 59 | Admitting: Internal Medicine

## 2021-02-25 LAB — C-REACTIVE PROTEIN: CRP: 1.9 mg/L (ref <8.0)

## 2021-02-25 NOTE — Telephone Encounter (Signed)
I attempted to contact the patient regarding lab results. Patient did not answer and my chart message also sent to the patient. Gordon [name] T Brooks Sailors

## 2021-02-25 NOTE — Telephone Encounter (Signed)
-----   Message from Mignon Pine, DO sent at 02/25/2021  1:02 PM EST ----- Please let patient know that his CRP remains normal.  Continue with doxycycline for suppression and follow up with Dr Gale Journey in about 3 months.   Thanks

## 2021-02-28 NOTE — Telephone Encounter (Signed)
Unable to leave message. Will try again later.

## 2021-03-01 NOTE — Telephone Encounter (Signed)
Left [facility] compliant vm and sent mychart message advising result and to please call and schedule 3 month follow up soon.

## 2021-03-21 ENCOUNTER — Encounter: Payer: Self-pay | Admitting: Internal Medicine

## 2021-03-22 ENCOUNTER — Other Ambulatory Visit (HOSPITAL_COMMUNITY): Payer: Self-pay

## 2021-03-24 ENCOUNTER — Other Ambulatory Visit: Payer: Self-pay

## 2021-03-24 ENCOUNTER — Ambulatory Visit (HOSPITAL_COMMUNITY)
Admission: RE | Admit: 2021-03-24 | Discharge: 2021-03-24 | Disposition: A | Discharge status: Home / Self Care | Payer: 59 | Source: Ambulatory Visit | Attending: Internal Medicine | Admitting: Internal Medicine

## 2021-03-24 DIAGNOSIS — C719 Malignant neoplasm of brain, unspecified: Secondary | ICD-10-CM | POA: Diagnosis present

## 2021-03-24 MED ORDER — GADOBUTROL 1 MMOL/ML IV SOLN
10.0000 mL | Freq: Once | INTRAVENOUS | Status: AC | PRN
  Administered 2021-03-24: 10 mL via INTRAVENOUS

## 2021-03-28 ENCOUNTER — Inpatient Hospital Stay: Payer: 59 | Attending: Internal Medicine

## 2021-03-28 DIAGNOSIS — R112 Nausea with vomiting, unspecified: Secondary | ICD-10-CM | POA: Insufficient documentation

## 2021-03-28 DIAGNOSIS — Z923 Personal history of irradiation: Secondary | ICD-10-CM | POA: Insufficient documentation

## 2021-03-28 DIAGNOSIS — Z79899 Other long term (current) drug therapy: Secondary | ICD-10-CM | POA: Insufficient documentation

## 2021-03-28 DIAGNOSIS — Z9221 Personal history of antineoplastic chemotherapy: Secondary | ICD-10-CM | POA: Insufficient documentation

## 2021-03-28 DIAGNOSIS — R569 Unspecified convulsions: Secondary | ICD-10-CM | POA: Insufficient documentation

## 2021-03-28 DIAGNOSIS — C711 Malignant neoplasm of frontal lobe: Secondary | ICD-10-CM | POA: Insufficient documentation

## 2021-03-31 ENCOUNTER — Inpatient Hospital Stay: Payer: 59

## 2021-03-31 ENCOUNTER — Other Ambulatory Visit: Payer: Self-pay | Admitting: Internal Medicine

## 2021-03-31 ENCOUNTER — Other Ambulatory Visit: Payer: Self-pay

## 2021-03-31 ENCOUNTER — Inpatient Hospital Stay: Payer: 59 | Admitting: Internal Medicine

## 2021-03-31 VITALS — BP 135/79 | HR 79 | Temp 98.6°F | Resp 20 | Wt 219.5 lb

## 2021-03-31 DIAGNOSIS — C719 Malignant neoplasm of brain, unspecified: Secondary | ICD-10-CM

## 2021-03-31 DIAGNOSIS — R569 Unspecified convulsions: Secondary | ICD-10-CM

## 2021-03-31 DIAGNOSIS — R112 Nausea with vomiting, unspecified: Secondary | ICD-10-CM | POA: Diagnosis not present

## 2021-03-31 DIAGNOSIS — Z923 Personal history of irradiation: Secondary | ICD-10-CM | POA: Diagnosis not present

## 2021-03-31 DIAGNOSIS — Z79899 Other long term (current) drug therapy: Secondary | ICD-10-CM | POA: Diagnosis not present

## 2021-03-31 DIAGNOSIS — C711 Malignant neoplasm of frontal lobe: Secondary | ICD-10-CM | POA: Diagnosis present

## 2021-03-31 DIAGNOSIS — Z9221 Personal history of antineoplastic chemotherapy: Secondary | ICD-10-CM | POA: Diagnosis not present

## 2021-03-31 LAB — CMP (CANCER CENTER ONLY)
ALT: 16 U/L (ref 0–44)
AST: 14 U/L — ABNORMAL LOW (ref 15–41)
Albumin: 5 g/dL (ref 3.5–5.0)
Alkaline Phosphatase: 51 U/L (ref 38–126)
Anion gap: 4 — ABNORMAL LOW (ref 5–15)
BUN: 13 mg/dL (ref 6–20)
CO2: 30 mmol/L (ref 22–32)
Calcium: 10.4 mg/dL — ABNORMAL HIGH (ref 8.9–10.3)
Chloride: 103 mmol/L (ref 98–111)
Creatinine: 0.88 mg/dL (ref 0.61–1.24)
GFR, Estimated: >60 mL/min (ref >60)
Glucose, Bld: 95 mg/dL (ref 70–99)
Potassium: 4.6 mmol/L (ref 3.5–5.1)
Sodium: 137 mmol/L (ref 135–145)
Total Bilirubin: 0.6 mg/dL (ref 0.3–1.2)
Total Protein: 7.7 g/dL (ref 6.5–8.1)

## 2021-03-31 LAB — CBC WITH DIFFERENTIAL (CANCER CENTER ONLY)
Abs Immature Granulocytes: 0.01 10*3/uL (ref 0.00–0.07)
Basophils Absolute: 0 10*3/uL (ref 0.0–0.1)
Basophils Relative: 1 %
Eosinophils Absolute: 0.1 10*3/uL (ref 0.0–0.5)
Eosinophils Relative: 2 %
HCT: 42.1 % (ref 39.0–52.0)
Hemoglobin: 14.9 g/dL (ref 13.0–17.0)
Immature Granulocytes: 0 %
Lymphocytes Relative: 26 %
Lymphs Abs: 1.1 10*3/uL (ref 0.7–4.0)
MCH: 31 pg (ref 26.0–34.0)
MCHC: 35.4 g/dL (ref 30.0–36.0)
MCV: 87.5 fL (ref 80.0–100.0)
Monocytes Absolute: 0.3 10*3/uL (ref 0.1–1.0)
Monocytes Relative: 8 %
Neutro Abs: 2.6 10*3/uL (ref 1.7–7.7)
Neutrophils Relative %: 63 %
Platelet Count: 247 10*3/uL (ref 150–400)
RBC: 4.81 MIL/uL (ref 4.22–5.81)
RDW: 12.6 % (ref 11.5–15.5)
WBC Count: 4.1 10*3/uL (ref 4.0–10.5)
nRBC: 0 % (ref 0.0–0.2)

## 2021-03-31 NOTE — Progress Notes (Signed)
[facility] [place], [place] [address] [phone]   Interval Evaluation  Date of Service: 03/31/21 Patient Name: Gordon [name] Patient MRN: [id number] Patient DOB: September 27, 1995 Provider: Ventura Sellers, MD  Identifying Statement:  Gordon [name] is a 26 y.o. 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; path is grade 4 astrocytoma, IDH-1 mutant   04/20/2020 Surgery   Washout of surgical site collection; speciates MSSA   05/17/2020 Surgery   Repeat wound exploration, irrigation.   06/07/2020 -  Radiation Therapy   IMRT and concurrent Temodar 20m/m2   08/09/2020 -  Chemotherapy   Patient is on Treatment Plan : BRAIN GLIOBLASTOMA Consolidation Temozolomide Days 1-5 q28 Days        Biomarkers:  MGMT Unknown.  IDH 1/2 Mutated.  EGFR Unknown  TERT Unknown   Interval History:  Gordon [name] to clinic for follow up today, now having completed cycle #8 of adjuvant Temodar.  No new or progressive deficits today.  Nausea still managed with reglan and compazine.  Continues to work bCatering manager  H+P (04/12/20) Patient presented to medical attention three weeks prior with episode of loss of awareness and generalized shaking consistent with seizure episode.  CNS imaging demonstrated an enhancing left frontal mass, which was resected on 04/04/20 by [name]  He tolerated surgery well, has no significant complaints at this time.  Last day of decadron taper is today.  He has been more or less independent at home, starting to resume some of his studies at school.  Currently a business student at [employer]  No other significant medical history.  Medications: Current Outpatient Medications on File Prior to Visit  Medication Sig Dispense  Refill   doxycycline (VIBRAMYCIN) 100 MG capsule Take 100 mg by mouth 2 (two) times daily.     levETIRAcetam (KEPPRA) 750 MG tablet Take 1 tablet (750 mg total) by mouth 2 (two) times daily. 60 tablet 5   methylphenidate (RITALIN) 5 MG tablet Take 10 mg by mouth 2 (two) times daily.     metoCLOPramide (REGLAN) 10 MG tablet Take 1 tablet (10 mg total) by mouth every 6 (six) hours as needed for nausea. 60 tablet 1   Multiple Vitamin (MULTIVITAMIN PO) Take 1 capsule by mouth daily.     Omega-3 Fatty Acids (OMEGA 3 PO) Take 1 capsule by mouth daily.     ondansetron (ZOFRAN) 8 MG tablet Take 1 tablet (8 mg total) by mouth 2 (two) times daily as needed (nausea and vomiting). May take 30-60 minutes prior to Temodar administration if nausea/vomiting occurs. 30 tablet 1   Probiotic Product (PROBIOTIC PO) Take 1 capsule by mouth daily.     prochlorperazine (COMPAZINE) 10 MG tablet Take 10 mg by mouth every 6 (six) hours as needed.     temozolomide (TEMODAR) 140 MG capsule TAKE 3 CAPSULES BY MOUTH DAILY  FOR 5 DAYS OF 28 DAY CYCLE . MAY TAKE ON AN EMPTY STOMACH TO  DECREASE NAUSEA AND VOMITING. 15 capsule 0   temozolomide (TEMODAR) 140 MG capsule Take 3 capsules (420 mg total) by mouth at bedtime. 15 capsule 0   No current facility-administered medications on file prior to visit.    Allergies: No Known Allergies Past Medical History:  Past Medical History:  Diagnosis Date   Bulging lumbar disc    Complication of anesthesia    PONV (postoperative nausea and vomiting)    Vomited x1 after craniotomy surgery   Seizure ([place]) 04/03/2020   x 1 at home pre-admission.  Otherwise, no history.   Past Surgical History:  Past Surgical History:  Procedure Laterality Date   BURR HOLE Right 04/20/2020   Procedure: Irrigation and Debridement of Cranial Wound;  Surgeon: [name], MD;  Location: [facility];  Service: Neurosurgery;  Laterality: Right;   CRANIOTOMY Left 04/04/2020   Procedure: LEFT CRANIOTOMY  FOR TUMOR EXCISION;  Surgeon: [name], MD;  Location: [facility];  Service: Neurosurgery;  Laterality: Left;   TONSILLECTOMY     WISDOM TOOTH EXTRACTION     WOUND EXPLORATION Left 05/17/2020   Procedure: CRANIAL WOUND EXPLORATION WITH PLACEMENT OF MESH;  Surgeon: [name], MD;  Location: [facility];  Service: Neurosurgery;  Laterality: Left;   Social History:  Social History   Socioeconomic History   Marital status: Single    Spouse name: Not on file   Number of children: 0   Years of education: Not on file   Highest education level: Not on file  Occupational History   Not on file  Tobacco Use   Smoking status: Never   Smokeless tobacco: Never  Vaping Use   Vaping Use: Never used  Substance and Sexual Activity   Alcohol use: No    Alcohol/week: 0.0 standard drinks   Drug use: No   Sexual activity: Yes    Birth control/protection: Condom  Other Topics Concern   Not on file  Social History Narrative   Not on file   Social Determinants of Health   Financial Resource Strain: Not on file  Food Insecurity: Not on file  Transportation Needs: Not on file  Physical Activity: Not on file  Stress: Not on file  Social Connections: Not on file  Intimate Partner Violence: Not on file   Family History: No family history on file.  Review of Systems: Constitutional: Doesn't report fevers, chills or abnormal weight loss Eyes: Doesn't report blurriness of vision Ears, nose, mouth, throat, and face: Doesn't report sore throat Respiratory: Doesn't report cough, dyspnea or wheezes Cardiovascular: Doesn't report palpitation, chest discomfort  Gastrointestinal:  Doesn't report nausea, constipation, diarrhea GU: Doesn't report incontinence Skin: Doesn't report skin rashes Neurological: Per HPI Musculoskeletal: Doesn't report joint pain Behavioral/Psych: Doesn't report anxiety  Physical Exam: Vitals:   03/31/21 1010  BP: 135/79  Pulse: 79  Resp: 20  Temp: 98.6 F (37  C)  SpO2: 100%    KPS: 90. General: Alert, cooperative, pleasant, in no acute distress Head: Normal EENT: No conjunctival injection or scleral icterus.  Lungs: Resp effort normal Cardiac: Regular rate Abdomen: Non-distended abdomen Skin: No rashes cyanosis or petechiae. Extremities: No clubbing or edema  Neurologic Exam: Mental Status: Awake, alert, attentive to examiner. Oriented to self and environment. Language is fluent with intact comprehension.  Cranial Nerves: Visual acuity is grossly normal. Visual fields are full. Extra-ocular movements intact. No ptosis. Face is symmetric Motor: Tone and bulk are normal. Power is full in both arms and legs. Reflexes are symmetric, no pathologic reflexes present.  Sensory: Intact to light touch Gait: Normal.   Labs: I have reviewed the data as listed    Component Value Date/Time   NA 138 02/17/2021 0903   K 4.5 02/17/2021 0903   CL 104 02/17/2021 0903   CO2 28 02/17/2021  0630   GLUCOSE 94 02/17/2021 0903   BUN 14 02/17/2021 0903   CREATININE 0.99 02/17/2021 0903   CREATININE 1.06 08/25/2020 1451   CALCIUM 10.1 02/17/2021 0903   PROT 7.9 02/17/2021 0903   ALBUMIN 4.9 02/17/2021 0903   AST 18 02/17/2021 0903   ALT 25 02/17/2021 0903   ALKPHOS 56 02/17/2021 0903   BILITOT 0.9 02/17/2021 0903   GFRNONAA >60 02/17/2021 0903   GFRAA  08/10/2009 2150    NOT CALCULATED        The eGFR has been calculated using the MDRD equation. This calculation has not been validated in all clinical situations. eGFR's persistently <60 mL/min signify possible Chronic Kidney Disease.   Lab Results  Component Value Date   WBC 4.1 03/31/2021   NEUTROABS 2.6 03/31/2021   HGB 14.9 03/31/2021   HCT 42.1 03/31/2021   MCV 87.5 03/31/2021   PLT 247 03/31/2021   Imaging:  [facility] Clinician Interpretation: I have personally reviewed the CNS images as listed.  My interpretation, in the context of the patient's clinical presentation, is stable  disease  MR Brain W Wo Contrast  Result Date: 03/24/2021 CLINICAL DATA:  Brain/CNS neoplasm, assess treatment response: Grade 4 astrocytoma follow-up EXAM: MRI HEAD WITHOUT AND WITH CONTRAST TECHNIQUE: Multiplanar, multiecho pulse sequences of the brain and surrounding structures were obtained without and with intravenous contrast. CONTRAST:  667m GADAVIST GADOBUTROL 1 MMOL/ML IV SOLN COMPARISON:  01/20/2021 FINDINGS: Brain: Hemosiderin lined left frontal resection cavity involving the superior frontal gyrus. There remains linear enhancement along the cavity margins primarily anteriorly. No new nodular enhancement. Extent of surrounding T2 FLAIR hyperintensity with involvement of the ventral body of the corpus callosum is unchanged. There is no acute infarction. There is no hydrocephalus or extra-axial fluid collection. Vascular: Major vessel flow voids at the skull base are preserved. Skull and upper cervical spine: Normal marrow signal is preserved. Left frontal revised craniotomy with mesh cranioplasty. Sinuses/Orbits: Lobular right greater than left maxillary sinus mucosal thickening. Orbits are unremarkable. Other: Sella is unremarkable.  Mastoid air cells are clear. IMPRESSION: Stable appearance.  No evidence of disease progression. Electronically Signed   By: PMacy MisM.D.   On: 03/24/2021 13:49    Assessment/Plan 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[place]  [place] ([place]  AJaleil RenwickCranford is clinically and radiographically stable today, now having completed cycle #8 of adjuvant Temodar.  No change in volume of enhancement or T2/FLAIR signal.  We recommended continuing treatment with cycle #9 Temozolomide at 200 mg/m2, on for five days and off for twenty three days in twenty eight day cycles. The patient will have a complete blood count performed on days 21 and 28 of each cycle, and a comprehensive metabolic panel performed on day 28 of each cycle. Labs may need to be performed more often. Compazine, rather  than zofran will prescribed for home use for nausea/vomiting due to headaches this past month.   Chemotherapy should be held for the following:  ANC less than 1,000  Platelets less than 100,000  LFT or creatinine greater than 2x ULN  If clinical concerns/contraindications develop  Will con't reglan+compazine for nausea.  He will continue Keppra 7540mBID.  Also on Ritalin now 67m36mID.  AndElizandro Lauraanford should return to clinic in 1 month with labs for evaluation prior to cycle #10.  All questions were answered. The patient knows to call the clinic with any problems, questions or concerns. No barriers to learning were detected.  The total time spent  in the encounter was 30 minutes and more than 50% was on counseling and review of test results   Ventura Sellers, MD Medical Director of Neuro-Oncology [facility] [place] 03/31/21 10:33 AM

## 2021-04-03 ENCOUNTER — Encounter: Payer: Self-pay | Admitting: Internal Medicine

## 2021-04-04 ENCOUNTER — Other Ambulatory Visit: Payer: Self-pay | Admitting: Internal Medicine

## 2021-04-04 MED ORDER — LEVETIRACETAM 750 MG PO TABS: 750.0000 mg | ORAL_TABLET | Freq: Two times a day (BID) | ORAL | 5 refills | Status: DC

## 2021-04-04 MED ORDER — PROCHLORPERAZINE MALEATE 10 MG PO TABS: 10.0000 mg | ORAL_TABLET | Freq: Four times a day (QID) | ORAL | 1 refills | Status: DC | PRN

## 2021-04-04 MED ORDER — METOCLOPRAMIDE HCL 10 MG PO TABS: 10.0000 mg | ORAL_TABLET | Freq: Four times a day (QID) | ORAL | 1 refills | Status: DC | PRN

## 2021-04-21 ENCOUNTER — Telehealth: Payer: Self-pay | Admitting: Pharmacist

## 2021-04-21 ENCOUNTER — Other Ambulatory Visit (HOSPITAL_COMMUNITY): Payer: Self-pay

## 2021-04-21 NOTE — Telephone Encounter (Signed)
Oral Oncology Pharmacist Encounter ? ?Prior Authorization for Temozolomide has been approved.   ? ?PA# [id number] ?Effective dates: 04/21/21 through 04/22/22 ? ?Patient fills through [facility].  ? ?Leron Croak, PharmD, BCPS ?Hematology/Oncology Clinical Pharmacist ?[facility] ?[phone] ?04/21/2021 11:38 AM ? ? ? ?

## 2021-04-21 NOTE — Telephone Encounter (Signed)
Oral Oncology Pharmacist Encounter ?  ?Received notification from [employer] that prior authorization for Temozolomide is required. ?  ?PA submitted on CoverMyMeds ?Key: [id number] ?Status is pending ?  ?Oral Oncology Clinic will continue to follow.  ? ?Leron Croak, PharmD, BCPS ?Hematology/Oncology Clinical Pharmacist ?[facility] ?[phone] ?04/21/2021 11:37 AM ? ?

## 2021-04-28 ENCOUNTER — Inpatient Hospital Stay: Payer: 59 | Attending: Internal Medicine

## 2021-04-28 ENCOUNTER — Other Ambulatory Visit: Payer: Self-pay

## 2021-04-28 ENCOUNTER — Other Ambulatory Visit (HOSPITAL_COMMUNITY): Payer: Self-pay

## 2021-04-28 ENCOUNTER — Inpatient Hospital Stay: Payer: 59 | Admitting: Internal Medicine

## 2021-04-28 VITALS — BP 127/78 | HR 83 | Temp 98.1°F | Resp 20 | Wt 216.3 lb

## 2021-04-28 DIAGNOSIS — R569 Unspecified convulsions: Secondary | ICD-10-CM | POA: Insufficient documentation

## 2021-04-28 DIAGNOSIS — Z79899 Other long term (current) drug therapy: Secondary | ICD-10-CM | POA: Diagnosis not present

## 2021-04-28 DIAGNOSIS — C719 Malignant neoplasm of brain, unspecified: Secondary | ICD-10-CM

## 2021-04-28 DIAGNOSIS — C711 Malignant neoplasm of frontal lobe: Secondary | ICD-10-CM | POA: Diagnosis present

## 2021-04-28 LAB — CBC WITH DIFFERENTIAL (CANCER CENTER ONLY)
Abs Immature Granulocytes: 0.01 10*3/uL (ref 0.00–0.07)
Basophils Absolute: 0 10*3/uL (ref 0.0–0.1)
Basophils Relative: 1 %
Eosinophils Absolute: 0.1 10*3/uL (ref 0.0–0.5)
Eosinophils Relative: 2 %
HCT: 42.2 % (ref 39.0–52.0)
Hemoglobin: 14.7 g/dL (ref 13.0–17.0)
Immature Granulocytes: 0 %
Lymphocytes Relative: 29 %
Lymphs Abs: 1.3 10*3/uL (ref 0.7–4.0)
MCH: 30.4 pg (ref 26.0–34.0)
MCHC: 34.8 g/dL (ref 30.0–36.0)
MCV: 87.4 fL (ref 80.0–100.0)
Monocytes Absolute: 0.4 10*3/uL (ref 0.1–1.0)
Monocytes Relative: 9 %
Neutro Abs: 2.6 10*3/uL (ref 1.7–7.7)
Neutrophils Relative %: 59 %
Platelet Count: 218 10*3/uL (ref 150–400)
RBC: 4.83 MIL/uL (ref 4.22–5.81)
RDW: 12.2 % (ref 11.5–15.5)
WBC Count: 4.4 10*3/uL (ref 4.0–10.5)
nRBC: 0 % (ref 0.0–0.2)

## 2021-04-28 LAB — CMP (CANCER CENTER ONLY)
ALT: 16 U/L (ref 0–44)
AST: 15 U/L (ref 15–41)
Albumin: 4.9 g/dL (ref 3.5–5.0)
Alkaline Phosphatase: 57 U/L (ref 38–126)
Anion gap: 4 — ABNORMAL LOW (ref 5–15)
BUN: 15 mg/dL (ref 6–20)
CO2: 29 mmol/L (ref 22–32)
Calcium: 10 mg/dL (ref 8.9–10.3)
Chloride: 104 mmol/L (ref 98–111)
Creatinine: 0.92 mg/dL (ref 0.61–1.24)
GFR, Estimated: >60 mL/min (ref >60)
Glucose, Bld: 91 mg/dL (ref 70–99)
Potassium: 4.5 mmol/L (ref 3.5–5.1)
Sodium: 137 mmol/L (ref 135–145)
Total Bilirubin: 0.5 mg/dL (ref 0.3–1.2)
Total Protein: 7.8 g/dL (ref 6.5–8.1)

## 2021-04-28 MED ORDER — METHYLPHENIDATE HCL 10 MG PO TABS
10.0000 mg | ORAL_TABLET | Freq: Two times a day (BID) | ORAL | 0 refills | Status: DC
  Filled 2021-04-28: qty 60, 30d supply, fill #0

## 2021-04-28 MED ORDER — TEMOZOLOMIDE 140 MG PO CAPS: 420.0000 mg | ORAL_CAPSULE | Freq: Every day | ORAL | 0 refills | Status: DC

## 2021-04-28 NOTE — Progress Notes (Signed)
? ?[facility] [address]  ?[place], [place] [address] ?[phone] ? ? ?Interval Evaluation ? ?Date of Service: 04/28/21 ?Patient Name: Gordon [name] ?Patient MRN: [id number] ?Patient DOB: Nov 09, 1995 ?Provider: Ventura Sellers, MD ? ?Identifying Statement:  ?Gordon [name] is a 26 y.o. male with left frontal  grade 4 astrocytoma   ? ?Oncologic History: ?Oncology History  ?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[place])  ?04/04/2020 Surgery  ? Craniotomy, resection of left frontal mass with [name]; path is grade 4 astrocytoma, IDH-1 mutant ?  ?04/20/2020 Surgery  ? Washout of surgical site collection; speciates MSSA ?  ?05/17/2020 Surgery  ? Repeat wound exploration, irrigation. ?  ?06/07/2020 -  Radiation Therapy  ? IMRT and concurrent Temodar 26m/m2 ?  ?08/09/2020 -  Chemotherapy  ? Patient is on Treatment Plan : BRAIN GLIOBLASTOMA Consolidation Temozolomide Days 1-5 q28 Days   ?   ? ? ?Biomarkers: ? ?MGMT Unknown.  ?IDH 1/2 Mutated.  ?EGFR Unknown  ?TERT Unknown  ? ?Interval History: ? Gordon [name] presents to clinic for follow up today, now having completed cycle #9 of adjuvant Temodar.  No new or progressive deficits today.  No nausea or vomiting this month with chemo.  Continues to work bCatering manager ? ?H+P (04/12/20) Patient presented to medical attention three weeks prior with episode of loss of awareness and generalized shaking consistent with seizure episode.  CNS imaging demonstrated an enhancing left frontal mass, which was resected on 04/04/20 by [name]  He tolerated surgery well, has no significant complaints at this time.  Last day of decadron taper is today.  He has been more or less independent at home, starting to resume some of his studies at school.  Currently a business student at [employer]  No other significant medical history. ? ?Medications: ?Current Outpatient Medications on File Prior to Visit  ?Medication Sig Dispense  Refill  ? doxycycline (VIBRAMYCIN) 100 MG capsule Take 100 mg by mouth 2 (two) times daily.    ? levETIRAcetam (KEPPRA) 750 MG tablet Take 1 tablet (750 mg total) by mouth 2 (two) times daily. 60 tablet 5  ? metoCLOPramide (REGLAN) 10 MG tablet Take 1 tablet (10 mg total) by mouth every 6 (six) hours as needed for nausea. 60 tablet 1  ? Multiple Vitamin (MULTIVITAMIN PO) Take 1 capsule by mouth daily.    ? Omega-3 Fatty Acids (OMEGA 3 PO) Take 1 capsule by mouth daily.    ? ondansetron (ZOFRAN) 8 MG tablet Take 1 tablet (8 mg total) by mouth 2 (two) times daily as needed (nausea and vomiting). May take 30-60 minutes prior to Temodar administration if nausea/vomiting occurs. 30 tablet 1  ? Probiotic Product (PROBIOTIC PO) Take 1 capsule by mouth daily.    ? prochlorperazine (COMPAZINE) 10 MG tablet Take 1 tablet (10 mg total) by mouth every 6 (six) hours as needed. 60 tablet 1  ? temozolomide (TEMODAR) 140 MG capsule TAKE 3 CAPSULES BY MOUTH DAILY  FOR 5 DAYS OF 28 DAY CYCLE . MAY TAKE ON AN EMPTY STOMACH TO  DECREASE NAUSEA AND VOMITING. 15 capsule 0  ? temozolomide (TEMODAR) 140 MG capsule TAKE 3 CAPSULES (420MG) BY MOUTH AT BEDTIME FOR 5 DAYS OF 28 DAY  CYCLE 15 capsule 0  ? ?No current facility-administered medications on file prior to visit.  ? ? ?Allergies: No Known Allergies ?Past Medical History:  ?Past Medical History:  ?Diagnosis Date  ? Bulging lumbar disc   ?  Complication of anesthesia   ? PONV (postoperative nausea and vomiting)   ? Vomited x1 after craniotomy surgery  ? Seizure ([place]) 04/03/2020  ? x 1 at home pre-admission.  Otherwise, no history.  ? ?Past Surgical History:  ?Past Surgical History:  ?Procedure Laterality Date  ? BURR HOLE Right 04/20/2020  ? Procedure: Irrigation and Debridement of Cranial Wound;  Surgeon: [name], MD;  Location: [facility];  Service: Neurosurgery;  Laterality: Right;  ? CRANIOTOMY Left 04/04/2020  ? Procedure: LEFT CRANIOTOMY FOR TUMOR EXCISION;  Surgeon:  [name], MD;  Location: [facility];  Service: Neurosurgery;  Laterality: Left;  ? TONSILLECTOMY    ? WISDOM TOOTH EXTRACTION    ? WOUND EXPLORATION Left 05/17/2020  ? Procedure: CRANIAL WOUND EXPLORATION WITH PLACEMENT OF MESH;  Surgeon: [name], MD;  Location: [facility];  Service: Neurosurgery;  Laterality: Left;  ? ?Social History:  ?Social History  ? ?Socioeconomic History  ? Marital status: Single  ?  Spouse name: Not on file  ? Number of children: 0  ? Years of education: Not on file  ? Highest education level: Not on file  ?Occupational History  ? Not on file  ?Tobacco Use  ? Smoking status: Never  ? Smokeless tobacco: Never  ?Vaping Use  ? Vaping Use: Never used  ?Substance and Sexual Activity  ? Alcohol use: No  ?  Alcohol/week: 0.0 standard drinks  ? Drug use: No  ? Sexual activity: Yes  ?  Birth control/protection: Condom  ?Other Topics Concern  ? Not on file  ?Social History Narrative  ? Not on file  ? ?Social Determinants of Health  ? ?Financial Resource Strain: Not on file  ?Food Insecurity: Not on file  ?Transportation Needs: Not on file  ?Physical Activity: Not on file  ?Stress: Not on file  ?Social Connections: Not on file  ?Intimate Partner Violence: Not on file  ? ?Family History: No family history on file. ? ?Review of Systems: ?Constitutional: Doesn't report fevers, chills or abnormal weight loss ?Eyes: Doesn't report blurriness of vision ?Ears, nose, mouth, throat, and face: Doesn't report sore throat ?Respiratory: Doesn't report cough, dyspnea or wheezes ?Cardiovascular: Doesn't report palpitation, chest discomfort  ?Gastrointestinal:  Doesn't report nausea, constipation, diarrhea ?GU: Doesn't report incontinence ?Skin: Doesn't report skin rashes ?Neurological: Per HPI ?Musculoskeletal: Doesn't report joint pain ?Behavioral/Psych: Doesn't report anxiety ? ?Physical Exam: ?Vitals:  ? 04/28/21 1014  ?BP: 127/78  ?Pulse: 83  ?Resp: 20  ?Temp: 98.1 ?F (36.7 ?C)  ?SpO2: 100%  ? ? ?KPS:  90. ?General: Alert, cooperative, pleasant, in no acute distress ?Head: Normal ?EENT: No conjunctival injection or scleral icterus.  ?Lungs: Resp effort normal ?Cardiac: Regular rate ?Abdomen: Non-distended abdomen ?Skin: No rashes cyanosis or petechiae. ?Extremities: No clubbing or edema ? ?Neurologic Exam: ?Mental Status: Awake, alert, attentive to examiner. Oriented to self and environment. Language is fluent with intact comprehension.  ?Cranial Nerves: Visual acuity is grossly normal. Visual fields are full. Extra-ocular movements intact. No ptosis. Face is symmetric ?Motor: Tone and bulk are normal. Power is full in both arms and legs. Reflexes are symmetric, no pathologic reflexes present.  ?Sensory: Intact to light touch ?Gait: Normal. ? ? ?Labs: ?I have reviewed the data as listed ?   ?Component Value Date/Time  ? NA 137 04/28/2021 1006  ? K 4.5 04/28/2021 1006  ? CL 104 04/28/2021 1006  ? CO2 29 04/28/2021 1006  ? GLUCOSE 91 04/28/2021 1006  ? BUN  15 04/28/2021 1006  ? CREATININE 0.92 04/28/2021 1006  ? CREATININE 1.06 08/25/2020 1451  ? CALCIUM 10.0 04/28/2021 1006  ? PROT 7.8 04/28/2021 1006  ? ALBUMIN 4.9 04/28/2021 1006  ? AST 15 04/28/2021 1006  ? ALT 16 04/28/2021 1006  ? ALKPHOS 57 04/28/2021 1006  ? BILITOT 0.5 04/28/2021 1006  ? GFRNONAA >60 04/28/2021 1006  ? GFRAA  08/10/2009 2150  ?  NOT CALCULATED        ?The eGFR has been calculated ?using the MDRD equation. ?This calculation has not been ?validated in all clinical ?situations. ?eGFR's persistently ?<60 mL/min signify ?possible Chronic Kidney Disease.  ? ?Lab Results  ?Component Value Date  ? WBC 4.4 04/28/2021  ? NEUTROABS 2.6 04/28/2021  ? HGB 14.7 04/28/2021  ? HCT 42.2 04/28/2021  ? MCV 87.4 04/28/2021  ? PLT 218 04/28/2021  ? ?Imaging: ? ?[facility] Clinician Interpretation: I have personally reviewed the CNS images as listed.  My interpretation, in the context of the patient's clinical presentation, is stable disease ? ?No results  found. ? ?Assessment/Plan ?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[facility]) - Plan: temozolomide (TEMODAR) 140 MG capsule ? ?Seizure ([place]) ? ?Gordon [name] is clinically and radiographically stable today, now having completed cycle #9 of adju

## 2021-04-30 ENCOUNTER — Other Ambulatory Visit: Payer: Self-pay | Admitting: Internal Medicine

## 2021-05-01 ENCOUNTER — Other Ambulatory Visit: Payer: Self-pay | Admitting: Internal Medicine

## 2021-05-10 ENCOUNTER — Other Ambulatory Visit: Payer: Self-pay | Admitting: Radiation Therapy

## 2021-05-18 ENCOUNTER — Telehealth: Payer: Self-pay | Admitting: Internal Medicine

## 2021-05-18 NOTE — Telephone Encounter (Signed)
.  Called pt per 4/25 inbasket , Patient was unavailable, a message with appt time and date was left with number on file.   ?

## 2021-05-19 ENCOUNTER — Other Ambulatory Visit: Payer: Self-pay | Admitting: Internal Medicine

## 2021-05-19 DIAGNOSIS — C719 Malignant neoplasm of brain, unspecified: Secondary | ICD-10-CM

## 2021-05-22 ENCOUNTER — Other Ambulatory Visit: Payer: Self-pay | Admitting: Internal Medicine

## 2021-05-23 ENCOUNTER — Encounter: Payer: Self-pay | Admitting: Internal Medicine

## 2021-05-23 ENCOUNTER — Other Ambulatory Visit (HOSPITAL_COMMUNITY): Payer: Self-pay

## 2021-05-23 MED ORDER — METHYLPHENIDATE HCL 10 MG PO TABS
10.0000 mg | ORAL_TABLET | Freq: Two times a day (BID) | ORAL | 0 refills | Status: AC
Start: 2021-05-23 — End: ?
  Filled 2021-05-23 – 2021-05-27 (×2): qty 60, 30d supply, fill #0

## 2021-05-25 ENCOUNTER — Ambulatory Visit: Payer: 59 | Admitting: Internal Medicine

## 2021-05-25 ENCOUNTER — Encounter: Payer: Self-pay | Admitting: Internal Medicine

## 2021-05-25 ENCOUNTER — Other Ambulatory Visit: Payer: Self-pay

## 2021-05-25 VITALS — BP 119/75 | HR 77 | Temp 98.2°F | Ht 75.0 in | Wt 221.0 lb

## 2021-05-25 DIAGNOSIS — M869 Osteomyelitis, unspecified: Secondary | ICD-10-CM

## 2021-05-25 DIAGNOSIS — T847XXD Infection and inflammatory reaction due to other internal orthopedic prosthetic devices, implants and grafts, subsequent encounter: Secondary | ICD-10-CM | POA: Diagnosis not present

## 2021-05-25 NOTE — Patient Instructions (Signed)
Please have the labs do CRP with the beginning set of blood draw for chemo therapy ? ?Will discuss antibiotics management (off vs continuing) once chemo is done ? ?We can revisit in 3 months ? ? ?

## 2021-05-25 NOTE — Progress Notes (Signed)
?  ? ? ? ? ?[facility] ? ?Patient Active Problem List  ? Diagnosis Date Noted  ? Hardware complicating wound infection ([place])   ? Wound infection 05/16/2020  ? Wound infection after surgery 04/19/2020  ? 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 Brain mass 04/02/2020  ? Seizure ([place]) 04/02/2020  ? Left knee injury, subsequent encounter 06/13/2017  ? Low back pain 03/25/2014  ? ? ? ? ?Subjective:  ? ? Patient ID: Gordon [name], male    DOB: 07/12/1995, 26 y.o.   MRN: [id number] ? ?Chief Complaint  ?Patient presents with  ? Follow-up  ?  Surgical site infection  ? ? ? ?HPI: ? ?Zacarias Krauter is a 26 y.o. male here for f/u cranial om ? 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 ?He is s/p another I&D 4/25 and titanium mesh placement the same date. cx the second time MRSE and mssa but no p. Acnes ? ?He is doing well on mino/rifampin ?No seizure. Just taking keppra for seizure ? ?Chemo tx of tumor planned soon once infection controlled and wound healed. ? ? ?07/01/2020 id clinic f/u ?Patient had been continued on chemo for his brain cancer ?Doing well so far ?No issue with surgical site/titanium mesh wound infection ?No n/v/diarrhea ?No joint pain  ?No pigmentation ? ? ?08/25/2020 id clinic f/u ?Patient is s/p 3 months of rifampin/minocycline for surgical site related mrse/mssa hardware related OM of the craniotomy site ?Previous crp improving ?Tolerating abx without n/v/diarrhea/rash/myalgia/arthralgia ?Chemotherapy going well; will be on that for the next year, 28 day cycle. No end date on duration except 1 year of these 28 day cycles. ? ?11/02 id f/u ?Tolerating chemo ?Tolerating doxy ?No n/v/diarrhea ?No focal weakness/numbness ?No headache ?No visual change ? ?No complaint today ? ? ?05/25/21 id clinic f/u ?He saw my partner in 02/24/21 doing well, and continued on doxycycline ?He is on cycle 10 of chemo  for glioblastoma in the next couple weeks. He is clinically/radiologically stable per his oncologic doctor assessment and will get repeat brain imaging within the next few weeks ? ?He has no complaint today ? ? ? ?No Known Allergies ? ? ? ?Outpatient Medications Prior to Visit  ?Medication Sig Dispense Refill  ? doxycycline (VIBRAMYCIN) 100 MG capsule Take 100 mg by mouth 2 (two) times daily.    ? levETIRAcetam (KEPPRA) 750 MG tablet Take 1 tablet (750 mg total) by mouth 2 (two) times daily. 60 tablet 5  ? methylphenidate (RITALIN) 10 MG tablet Take 1 tablet by mouth 2 times daily. 60 tablet 0  ? metoCLOPramide (REGLAN) 10 MG tablet Take 1 tablet (10 mg total) by mouth every 6 (six) hours as needed for nausea. 60 tablet 1  ? Multiple Vitamin (MULTIVITAMIN PO) Take 1 capsule by mouth daily.    ? Omega-3 Fatty Acids (OMEGA 3 PO) Take 1 capsule by mouth daily.    ? ondansetron (ZOFRAN) 8 MG tablet Take 1 tablet (8 mg total) by mouth 2 (two) times daily as needed (nausea and vomiting). May take 30-60 minutes prior to Temodar administration if nausea/vomiting occurs. 30 tablet 1  ? Probiotic Product (PROBIOTIC PO) Take 1 capsule by mouth daily.    ? prochlorperazine (COMPAZINE) 10 MG tablet TAKE 1 TABLET(10 MG) BY MOUTH EVERY 6 HOURS AS NEEDED 60 tablet 1  ? temozolomide (TEMODAR) 140 MG capsule TAKE 3 CAPSULES BY MOUTH DAILY  FOR  5 DAYS OF 28 DAY CYCLE . MAY TAKE ON AN EMPTY STOMACH TO  DECREASE NAUSEA AND VOMITING. 15 capsule 0  ? temozolomide (TEMODAR) 140 MG capsule TAKE 3 CAPSULES (420MG) BY MOUTH AT BEDTIME FOR 5 DAYS OF 28 DAY  CYCLE 15 capsule 0  ? temozolomide (TEMODAR) 140 MG capsule TAKE 3 CAPSULES (420MG) BY MOUTH AT BEDTIME FOR 5 DAYS OF 28 DAY  CYCLE 15 capsule 0  ? ?No facility-administered medications prior to visit.  ? ? ? ?Social History  ? ?Socioeconomic History  ? Marital status: Single  ?  Spouse name: Not on file  ? Number of children: 0  ? Years of education: Not on file  ? Highest education level:  Not on file  ?Occupational History  ? Not on file  ?Tobacco Use  ? Smoking status: Never  ? Smokeless tobacco: Never  ?Vaping Use  ? Vaping Use: Never used  ?Substance and Sexual Activity  ? Alcohol use: No  ?  Alcohol/week: 0.0 standard drinks  ? Drug use: No  ? Sexual activity: Yes  ?  Birth control/protection: Condom  ?Other Topics Concern  ? Not on file  ?Social History Narrative  ? Not on file  ? ?Social Determinants of Health  ? ?Financial Resource Strain: Not on file  ?Food Insecurity: Not on file  ?Transportation Needs: Not on file  ?Physical Activity: Not on file  ?Stress: Not on file  ?Social Connections: Not on file  ?Intimate Partner Violence: Not on file  ? ? ? ? ?Review of Systems ?   ?All other ros negative ? ?Objective:  ?  ?BP 119/75   Pulse 77   Temp 98.2 ?F (36.8 ?C) (Temporal)   Ht 6' 3"  (1.905 m)   Wt 221 lb (100.2 kg)   BMI 27.62 kg/m?  ?Nursing note and vital signs reviewed. ? ?Physical Exam ?General/constitutional: no distress, pleasant ?HEENT: Normocephalic, PER, Conj Clear, EOMI, Oropharynx clear ?Neck supple ?CV: rrr no mrg ?Lungs: clear to auscultation, normal respiratory effort ?Abd: Soft, Nontender ?Ext: no edema ?Skin: No Rash ?Neuro: nonfocal -- healed craniotomy site ?MSK: no peripheral joint swelling/tenderness/warmth; back spines nontender ? ? ? ? ? ? ? ? ? ? ?Labs: ?Lab Results  ?Component Value Date  ? WBC 4.4 04/28/2021  ? HGB 14.7 04/28/2021  ? HCT 42.2 04/28/2021  ? MCV 87.4 04/28/2021  ? PLT 218 04/28/2021  ? ?Last metabolic panel ?Lab Results  ?Component Value Date  ? GLUCOSE 91 04/28/2021  ? NA 137 04/28/2021  ? K 4.5 04/28/2021  ? CL 104 04/28/2021  ? CO2 29 04/28/2021  ? BUN 15 04/28/2021  ? CREATININE 0.92 04/28/2021  ? GFRNONAA >60 04/28/2021  ? GFRAA  08/10/2009  ?  NOT CALCULATED        ?The eGFR has been calculated ?using the MDRD equation. ?This calculation has not been ?validated in all clinical ?situations. ?eGFR's persistently ?<60 mL/min signify ?possible  Chronic Kidney Disease.  ? CALCIUM 10.0 04/28/2021  ? PROT 7.8 04/28/2021  ? ALBUMIN 4.9 04/28/2021  ? BILITOT 0.5 04/28/2021  ? ALKPHOS 57 04/28/2021  ? AST 15 04/28/2021  ? ALT 16 04/28/2021  ? ANIONGAP 4 (L) 04/28/2021  ? ?Crp: ?02/24/21   1.9 ?8/03  0.9 ?5/11  1.2 ?4/23  5.1 ? ?Micro: ?4/25 OR debridement cx ?Mrse (S bactrim, tetra) ?Mssa (S bactrim, tetra) ? ?Serology: ? ?Imaging: ?05/15/20 mri brain wwo contrast ?IMPRESSION: ?1. Status post left frontal tumor resection with decreased blood ?  products at the resection site. Nonspecific contrast enhancement ?surrounding the resection cavity may be postsurgical. ?2. Decreased size of scalp fluid collection overlying the craniotomy ?site. No specific features of infection. ?3. Dural thickening underlying the craniotomy, likely reactive. ?  ? ?03/2021 mri brain ?Brain: Hemosiderin lined left frontal resection cavity involving the ?superior frontal gyrus. There remains linear enhancement along the ?cavity margins primarily anteriorly. No new nodular enhancement. ?Extent of surrounding T2 FLAIR hyperintensity with involvement of ?the ventral body of the corpus callosum is unchanged. ?  ?There is no acute infarction. There is no hydrocephalus or ?extra-axial fluid collection. ?  ?Vascular: Major vessel flow voids at the skull base are preserved. ?  ?Skull and upper cervical spine: Normal marrow signal is preserved. ?Left frontal revised craniotomy with mesh cranioplasty. ?  ?Sinuses/Orbits: Lobular right greater than left maxillary sinus ?mucosal thickening. Orbits are unremarkable. ?  ?Other: Sella is unremarkable.  Mastoid air cells are clear. ?  ?IMPRESSION: ?Stable appearance.  No evidence of disease progression. ? ? ?Assessment & Plan:  ? ?Problem List Items Addressed This Visit   ? ?  ? Other  ? Hardware complicating wound infection ([place])  ? ?Other Visit Diagnoses   ? ? Osteomyelitis, unspecified site, unspecified type ([place])    -  Primary  ? ?  ? ?Abx: ?08/25/20-c  doxycycline ? ?4/26-8/03 mino ?4/26-8/03 rifampin (300 rif bid) ? ?26 yo male hx brain tumore s/p excision 3/13 with placement of titanium plate, complicated by post op wound infection s/o I&D 3/29. Initial

## 2021-05-26 ENCOUNTER — Inpatient Hospital Stay: Payer: 59

## 2021-05-26 ENCOUNTER — Inpatient Hospital Stay: Payer: 59 | Admitting: Internal Medicine

## 2021-05-27 ENCOUNTER — Ambulatory Visit (HOSPITAL_COMMUNITY)
Admission: RE | Admit: 2021-05-27 | Discharge: 2021-05-27 | Disposition: A | Discharge status: Home / Self Care | Payer: 59 | Source: Ambulatory Visit | Attending: Internal Medicine | Admitting: Internal Medicine

## 2021-05-27 ENCOUNTER — Encounter: Payer: Self-pay | Admitting: Internal Medicine

## 2021-05-27 ENCOUNTER — Other Ambulatory Visit (HOSPITAL_COMMUNITY): Payer: Self-pay

## 2021-05-27 DIAGNOSIS — C719 Malignant neoplasm of brain, unspecified: Secondary | ICD-10-CM | POA: Diagnosis not present

## 2021-05-27 MED ORDER — GADOBUTROL 1 MMOL/ML IV SOLN
10.0000 mL | Freq: Once | INTRAVENOUS | Status: AC | PRN
  Administered 2021-05-27: 10 mL via INTRAVENOUS

## 2021-05-30 ENCOUNTER — Inpatient Hospital Stay: Payer: 59 | Attending: Internal Medicine

## 2021-05-30 DIAGNOSIS — R519 Headache, unspecified: Secondary | ICD-10-CM | POA: Insufficient documentation

## 2021-05-30 DIAGNOSIS — R569 Unspecified convulsions: Secondary | ICD-10-CM | POA: Insufficient documentation

## 2021-05-30 DIAGNOSIS — C711 Malignant neoplasm of frontal lobe: Secondary | ICD-10-CM | POA: Insufficient documentation

## 2021-05-30 DIAGNOSIS — Z79899 Other long term (current) drug therapy: Secondary | ICD-10-CM | POA: Insufficient documentation

## 2021-05-31 ENCOUNTER — Other Ambulatory Visit: Payer: Self-pay | Admitting: Internal Medicine

## 2021-06-02 ENCOUNTER — Ambulatory Visit: Payer: 59 | Admitting: Internal Medicine

## 2021-06-02 ENCOUNTER — Other Ambulatory Visit: Payer: 59

## 2021-06-09 ENCOUNTER — Other Ambulatory Visit: Payer: Self-pay

## 2021-06-09 ENCOUNTER — Inpatient Hospital Stay: Payer: 59

## 2021-06-09 ENCOUNTER — Telehealth: Payer: Self-pay | Admitting: Internal Medicine

## 2021-06-09 ENCOUNTER — Inpatient Hospital Stay: Payer: 59 | Admitting: Internal Medicine

## 2021-06-09 VITALS — BP 126/80 | HR 77 | Temp 98.1°F | Ht 75.0 in | Wt 221.9 lb

## 2021-06-09 DIAGNOSIS — C711 Malignant neoplasm of frontal lobe: Secondary | ICD-10-CM | POA: Diagnosis present

## 2021-06-09 DIAGNOSIS — R519 Headache, unspecified: Secondary | ICD-10-CM | POA: Diagnosis not present

## 2021-06-09 DIAGNOSIS — Z79899 Other long term (current) drug therapy: Secondary | ICD-10-CM | POA: Diagnosis not present

## 2021-06-09 DIAGNOSIS — R569 Unspecified convulsions: Secondary | ICD-10-CM | POA: Diagnosis not present

## 2021-06-09 DIAGNOSIS — C719 Malignant neoplasm of brain, unspecified: Secondary | ICD-10-CM

## 2021-06-09 LAB — CMP (CANCER CENTER ONLY)
ALT: 18 U/L (ref 0–44)
AST: 17 U/L (ref 15–41)
Albumin: 4.9 g/dL (ref 3.5–5.0)
Alkaline Phosphatase: 56 U/L (ref 38–126)
Anion gap: 4 — ABNORMAL LOW (ref 5–15)
BUN: 13 mg/dL (ref 6–20)
CO2: 30 mmol/L (ref 22–32)
Calcium: 10 mg/dL (ref 8.9–10.3)
Chloride: 103 mmol/L (ref 98–111)
Creatinine: 0.92 mg/dL (ref 0.61–1.24)
GFR, Estimated: >60 mL/min (ref >60)
Glucose, Bld: 95 mg/dL (ref 70–99)
Potassium: 4.3 mmol/L (ref 3.5–5.1)
Sodium: 137 mmol/L (ref 135–145)
Total Bilirubin: 0.5 mg/dL (ref 0.3–1.2)
Total Protein: 7.9 g/dL (ref 6.5–8.1)

## 2021-06-09 LAB — CBC WITH DIFFERENTIAL (CANCER CENTER ONLY)
Abs Immature Granulocytes: 0.01 10*3/uL (ref 0.00–0.07)
Basophils Absolute: 0 10*3/uL (ref 0.0–0.1)
Basophils Relative: 1 %
Eosinophils Absolute: 0.1 10*3/uL (ref 0.0–0.5)
Eosinophils Relative: 3 %
HCT: 41.2 % (ref 39.0–52.0)
Hemoglobin: 14.6 g/dL (ref 13.0–17.0)
Immature Granulocytes: 0 %
Lymphocytes Relative: 26 %
Lymphs Abs: 1 10*3/uL (ref 0.7–4.0)
MCH: 31.1 pg (ref 26.0–34.0)
MCHC: 35.4 g/dL (ref 30.0–36.0)
MCV: 87.7 fL (ref 80.0–100.0)
Monocytes Absolute: 0.3 10*3/uL (ref 0.1–1.0)
Monocytes Relative: 8 %
Neutro Abs: 2.6 10*3/uL (ref 1.7–7.7)
Neutrophils Relative %: 62 %
Platelet Count: 231 10*3/uL (ref 150–400)
RBC: 4.7 MIL/uL (ref 4.22–5.81)
RDW: 12.4 % (ref 11.5–15.5)
WBC Count: 4.1 10*3/uL (ref 4.0–10.5)
nRBC: 0 % (ref 0.0–0.2)

## 2021-06-09 NOTE — Progress Notes (Signed)
[facility] [place], [place] [address] [phone]   Interval Evaluation  Date of Service: 06/09/21 Patient Name: Gordon [name] Patient MRN: [id number] Patient DOB: 06/11/1995 Provider: Ventura Sellers, MD  Identifying Statement:  Gordon [name] is a 26 y.o. 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; path is grade 4 astrocytoma, IDH-1 mutant   04/20/2020 Surgery   [place] of surgical site collection; speciates MSSA   05/17/2020 Surgery   Repeat wound exploration, irrigation.   06/07/2020 -  Radiation Therapy   IMRT and concurrent Temodar 51m/m2   08/09/2020 -  Chemotherapy   Patient is on Treatment Plan : BRAIN GLIOBLASTOMA Consolidation Temozolomide Days 1-5 q28 Days         Biomarkers:  MGMT Unknown.  IDH 1/2 Mutated.  EGFR Unknown  TERT Unknown   Interval History:  Gordon Hommespresents to clinic for follow up today, now having completed cycle #10 of adjuvant Temodar.  No clinical changes today.  No nausea or vomiting this month with chemo.  Continues to work bCatering manager  H+P (04/12/20) Patient presented to medical attention three weeks prior with episode of loss of awareness and generalized shaking consistent with seizure episode.  CNS imaging demonstrated an enhancing left frontal mass, which was resected on 04/04/20 by [name]  He tolerated surgery well, has no significant complaints at this time.  Last day of decadron taper is today.  He has been more or less independent at home, starting to resume some of his studies at school.  Currently a business student at [employer]  No other significant medical history.  Medications: Current Outpatient Medications on File Prior to Visit  Medication Sig Dispense Refill    doxycycline (VIBRAMYCIN) 100 MG capsule Take 100 mg by mouth 2 (two) times daily.     levETIRAcetam (KEPPRA) 750 MG tablet Take 1 tablet (750 mg total) by mouth 2 (two) times daily. 60 tablet 5   methylphenidate (RITALIN) 10 MG tablet Take 1 tablet by mouth 2 times daily. 60 tablet 0   metoCLOPramide (REGLAN) 10 MG tablet Take 1 tablet (10 mg total) by mouth every 6 (six) hours as needed for nausea. 60 tablet 1   Multiple Vitamin (MULTIVITAMIN PO) Take 1 capsule by mouth daily.     Omega-3 Fatty Acids (OMEGA 3 PO) Take 1 capsule by mouth daily.     Probiotic Product (PROBIOTIC PO) Take 1 capsule by mouth daily.     ondansetron (ZOFRAN) 8 MG tablet Take 1 tablet (8 mg total) by mouth 2 (two) times daily as needed (nausea and vomiting). May take 30-60 minutes prior to Temodar administration if nausea/vomiting occurs. (Patient not taking: Reported on 06/09/2021) 30 tablet 1   prochlorperazine (COMPAZINE) 10 MG tablet TAKE 1 TABLET(10 MG) BY MOUTH EVERY 6 HOURS AS NEEDED (Patient not taking: Reported on 06/09/2021) 60 tablet 1   No current facility-administered medications on file prior to visit.    Allergies: No Known Allergies Past Medical History:  Past Medical History:  Diagnosis Date   Bulging lumbar disc    Complication of anesthesia    PONV (postoperative nausea and vomiting)    Vomited x1 after craniotomy surgery   [place]iz[place] ([place] 04/03/2020   x 1 at home pre-admission.  Otherwise, no  history.   Past Surgical History:  Past Surgical History:  Procedure Laterality Date   BURR HOLE Right 04/20/2020   Procedure: Irrigation and Debridement of Cranial Wound;  Surgeon: [name], MD;  Location: [facility];  Service: Neurosurgery;  Laterality: Right;   CRANIOTOMY Left 04/04/2020   Procedure: LEFT CRANIOTOMY FOR TUMOR EXCISION;  Surgeon: [name], MD;  Location: [facility];  Service: Neurosurgery;  Laterality: Left;   TONSILLECTOMY     WISDOM TOOTH EXTRACTION     WOUND EXPLORATION  Left 05/17/2020   Procedure: CRANIAL WOUND EXPLORATION WITH PLACEMENT OF MESH;  Surgeon: [name], MD;  Location: [facility];  Service: Neurosurgery;  Laterality: Left;   Social History:  Social History   Socioeconomic History   Marital status: Single    Spouse name: Not on file   Number of children: 0   Years of education: Not on file   Highest education level: Not on file  Occupational History   Not on file  Tobacco Use   Smoking status: Never   Smokeless tobacco: Never  Vaping Use   Vaping Use: Never used  Substance and Sexual Activity   Alcohol use: No    Alcohol/week: 0.0 standard drinks   Drug use: No   Sexual activity: Yes    Birth control/protection: Condom  Other Topics Concern   Not on file  Social History Narrative   Not on file   Social Determinants of Health   Financial Resource Strain: Not on file  Food Insecurity: Not on file  Transportation Needs: Not on file  Physical Activity: Not on file  Stress: Not on file  Social Connections: Not on file  Intimate Partner Violence: Not on file   Family History: No family history on file.  Review of Systems: Constitutional: Doesn't report fevers, chills or abnormal weight loss Eyes: Doesn't report blurriness of vision Ears, nose, mouth, throat, and face: Doesn't report sore throat Respiratory: Doesn't report cough, dyspnea or wheezes Cardiovascular: Doesn't report palpitation, chest discomfort  Gastrointestinal:  Doesn't report nausea, constipation, diarrhea GU: Doesn't report incontinence Skin: Doesn't report skin rashes Neurological: Per HPI Musculoskeletal: Doesn't report joint pain Behavioral/Psych: Doesn't report anxiety  Physical Exam: Vitals:   06/09/21 1004  BP: 126/80  Pulse: 77  Temp: 98.1 F (36.7 C)  SpO2: 100%   KPS: 90. General: Alert, cooperative, pleasant, in no acute distress Head: Normal EENT: No conjunctival injection or scleral icterus.  Lungs: Resp effort  normal Cardiac: Regular rate Abdomen: Non-distended abdomen Skin: No rashes cyanosis or petechiae. Extremities: No clubbing or edema  Neurologic Exam: Mental Status: Awake, alert, attentive to examiner. Oriented to self and environment. Language is fluent with intact comprehension.  Cranial Nerves: Visual acuity is grossly normal. Visual fields are full. Extra-ocular movements intact. No ptosis. Face is symmetric Motor: Tone and bulk are normal. Power is full in both arms and legs. Reflexes are symmetric, no pathologic reflexes present.  Sensory: Intact to light touch Gait: Normal.   Labs: I have reviewed the data as listed    Component Value Date/Time   NA 137 04/28/2021 1006   K 4.5 04/28/2021 1006   CL 104 04/28/2021 1006   CO2 29 04/28/2021 1006   GLUCOSE 91 04/28/2021 1006   BUN 15 04/28/2021 1006   CREATININE 0.92 04/28/2021 1006   CREATININE 1.06 08/25/2020 1451   CALCIUM 10.0 04/28/2021 1006   PROT 7.8 04/28/2021 1006   ALBUMIN 4.9 04/28/2021 1006   AST 15 04/28/2021  1006   ALT 16 04/28/2021 1006   ALKPHOS 57 04/28/2021 1006   BILITOT 0.5 04/28/2021 1006   GFRNONAA >60 04/28/2021 1006   GFRAA  08/10/2009 2150    NOT CALCULATED        The eGFR has been calculated using the MDRD equation. This calculation has not been validated in all clinical situations. eGFR's persistently <60 mL/min signify possible Chronic Kidney Disease.   Lab Results  Component Value Date   WBC 4.4 04/28/2021   NEUTROABS 2.6 04/28/2021   HGB 14.7 04/28/2021   HCT 42.2 04/28/2021   MCV 87.4 04/28/2021   PLT 218 04/28/2021   Imaging:  [facility] Clinician Interpretation: I have personally reviewed the CNS images as listed.  My interpretation, in the context of the patient's clinical presentation, is stable disease  MR BRAIN W WO CONTRAST  Result Date: 05/28/2021 CLINICAL DATA:  Brain/CNS neoplasm, assess treatment response. Grade IV glioma. EXAM: MRI HEAD WITHOUT AND WITH CONTRAST  TECHNIQUE: Multiplanar, multiecho pulse sequences of the brain and surrounding structures were obtained without and with intravenous contrast. CONTRAST:  105m GADAVIST GADOBUTROL 1 MMOL/ML IV SOLN COMPARISON:  03/24/2021 FINDINGS: Brain: No acute infarct, mass effect or extra-axial collection. No acute or chronic hemorrhage. Left frontal resection cavity with mild surrounding gliosis. The midline structures are normal. There is no abnormal contrast enhancement. Vascular: Major flow voids are preserved. Skull and upper cervical spine: Remote left frontal craniectomy Sinuses/Orbits:Right maxillary sinus retention cyst.  Normal orbits. IMPRESSION: Left frontal resection cavity without evidence of residual or recurrent tumor. Electronically Signed   By: KUlyses JarredM.D.   On: 05/28/2021 03:37    Assessment/Plan 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[place]  Seizure ([place]  AMerdith BoydCranford is clinically and radiographically stable today, now having completed cycle #10 of adjuvant Temodar.  Labs are within normal limits.  We recommended continuing treatment with cycle #10 Temozolomide at 200 mg/m2, on for five days and off for twenty three days in twenty eight day cycles. The patient will have a complete blood count performed on days 21 and 28 of each cycle, and a comprehensive metabolic panel performed on day 28 of each cycle. Labs may need to be performed more often. Compazine, rather than zofran will prescribed for home use for nausea/vomiting due to headaches this past month.   Chemotherapy should be held for the following:  ANC less than 1,000  Platelets less than 100,000  LFT or creatinine greater than 2x ULN  If clinical concerns/contraindications develop  Will con't reglan+compazine for nausea.  He will continue Keppra 7528mBID.  Ritalin may con't 1070mID, refill ordered today.  AndReuel Lamadridanford should return to clinic in 1 month with labs for evaluation prior to cycle #11.  All questions were  answered. The patient knows to call the clinic with any problems, questions or concerns. No barriers to learning were detected.  The total time spent in the encounter was 30 minutes and more than 50% was on counseling and review of test results   ZacVentura SellersD Medical Director of Neuro-Oncology [facility] [place]/18/23 10:04 AM

## 2021-06-09 NOTE — Telephone Encounter (Signed)
Per 5/18 los called and left pt a message about appointment   call back number left

## 2021-06-10 ENCOUNTER — Encounter: Payer: Self-pay | Admitting: Internal Medicine

## 2021-06-10 ENCOUNTER — Other Ambulatory Visit: Payer: Self-pay | Admitting: Internal Medicine

## 2021-06-10 DIAGNOSIS — C719 Malignant neoplasm of brain, unspecified: Secondary | ICD-10-CM

## 2021-06-13 ENCOUNTER — Encounter: Payer: Self-pay | Admitting: Internal Medicine

## 2021-06-13 ENCOUNTER — Other Ambulatory Visit: Payer: Self-pay | Admitting: Radiation Therapy

## 2021-06-15 ENCOUNTER — Other Ambulatory Visit: Payer: Self-pay | Admitting: Internal Medicine

## 2021-06-22 ENCOUNTER — Other Ambulatory Visit (HOSPITAL_COMMUNITY): Payer: Self-pay

## 2021-06-25 ENCOUNTER — Other Ambulatory Visit: Payer: Self-pay | Admitting: Internal Medicine

## 2021-06-29 ENCOUNTER — Other Ambulatory Visit (HOSPITAL_COMMUNITY): Payer: 59

## 2021-06-29 ENCOUNTER — Other Ambulatory Visit: Payer: Self-pay

## 2021-06-29 DIAGNOSIS — M869 Osteomyelitis, unspecified: Secondary | ICD-10-CM

## 2021-06-29 MED ORDER — DOXYCYCLINE HYCLATE 100 MG PO TABS: 100.0000 mg | ORAL_TABLET | Freq: Two times a day (BID) | ORAL | 0 refills | Status: DC

## 2021-06-29 NOTE — Progress Notes (Signed)
Patient called and requested refill of doxycycline. Patient has upcoming appointment with Dr. Gale Journey on 08/17/21. Pharmacy confirmed and refills provided.  Binnie Kand, RN

## 2021-07-05 ENCOUNTER — Other Ambulatory Visit: Payer: Self-pay | Admitting: Lab

## 2021-07-05 ENCOUNTER — Inpatient Hospital Stay: Payer: 59 | Admitting: Internal Medicine

## 2021-07-05 ENCOUNTER — Other Ambulatory Visit: Payer: Self-pay

## 2021-07-05 ENCOUNTER — Inpatient Hospital Stay: Payer: 59 | Attending: Internal Medicine

## 2021-07-05 VITALS — BP 128/71 | HR 82 | Temp 98.6°F | Resp 20 | Wt 226.7 lb

## 2021-07-05 DIAGNOSIS — C711 Malignant neoplasm of frontal lobe: Secondary | ICD-10-CM | POA: Diagnosis not present

## 2021-07-05 DIAGNOSIS — R569 Unspecified convulsions: Secondary | ICD-10-CM | POA: Diagnosis not present

## 2021-07-05 DIAGNOSIS — Z79899 Other long term (current) drug therapy: Secondary | ICD-10-CM | POA: Insufficient documentation

## 2021-07-05 DIAGNOSIS — C719 Malignant neoplasm of brain, unspecified: Secondary | ICD-10-CM

## 2021-07-05 DIAGNOSIS — R519 Headache, unspecified: Secondary | ICD-10-CM | POA: Diagnosis not present

## 2021-07-05 DIAGNOSIS — Z79630 Long term (current) use of alkylating agent: Secondary | ICD-10-CM | POA: Diagnosis not present

## 2021-07-05 LAB — CBC WITH DIFFERENTIAL (CANCER CENTER ONLY)
Abs Immature Granulocytes: 0.02 10*3/uL (ref 0.00–0.07)
Basophils Absolute: 0 10*3/uL (ref 0.0–0.1)
Basophils Relative: 1 %
Eosinophils Absolute: 0.1 10*3/uL (ref 0.0–0.5)
Eosinophils Relative: 2 %
HCT: 41.6 % (ref 39.0–52.0)
Hemoglobin: 14.7 g/dL (ref 13.0–17.0)
Immature Granulocytes: 1 %
Lymphocytes Relative: 26 %
Lymphs Abs: 1.1 10*3/uL (ref 0.7–4.0)
MCH: 31.1 pg (ref 26.0–34.0)
MCHC: 35.3 g/dL (ref 30.0–36.0)
MCV: 87.9 fL (ref 80.0–100.0)
Monocytes Absolute: 0.4 10*3/uL (ref 0.1–1.0)
Monocytes Relative: 8 %
Neutro Abs: 2.8 10*3/uL (ref 1.7–7.7)
Neutrophils Relative %: 62 %
Platelet Count: 241 10*3/uL (ref 150–400)
RBC: 4.73 MIL/uL (ref 4.22–5.81)
RDW: 12.5 % (ref 11.5–15.5)
WBC Count: 4.4 10*3/uL (ref 4.0–10.5)
nRBC: 0 % (ref 0.0–0.2)

## 2021-07-05 LAB — CMP (CANCER CENTER ONLY)
ALT: 18 U/L (ref 0–44)
AST: 16 U/L (ref 15–41)
Albumin: 4.8 g/dL (ref 3.5–5.0)
Alkaline Phosphatase: 46 U/L (ref 38–126)
Anion gap: 6 (ref 5–15)
BUN: 12 mg/dL (ref 6–20)
CO2: 28 mmol/L (ref 22–32)
Calcium: 10.2 mg/dL (ref 8.9–10.3)
Chloride: 105 mmol/L (ref 98–111)
Creatinine: 0.85 mg/dL (ref 0.61–1.24)
GFR, Estimated: >60 mL/min (ref >60)
Glucose, Bld: 101 mg/dL — ABNORMAL HIGH (ref 70–99)
Potassium: 4.2 mmol/L (ref 3.5–5.1)
Sodium: 139 mmol/L (ref 135–145)
Total Bilirubin: 0.5 mg/dL (ref 0.3–1.2)
Total Protein: 7.6 g/dL (ref 6.5–8.1)

## 2021-07-05 MED ORDER — TEMOZOLOMIDE 140 MG PO CAPS: 420.0000 mg | ORAL_CAPSULE | Freq: Every day | ORAL | 0 refills | Status: DC

## 2021-07-05 NOTE — Progress Notes (Signed)
[facility] [place], [place] [address] [phone]   Interval Evaluation  Date of Service: 07/05/21 Patient Name: Gordon Gordon [name] Patient MRN: [id number] Patient DOB: Jun 17, 1995 Provider: Ventura Sellers, MD  Identifying Statement:  Gordon Gordon [name] is a 26 y.o. male with left frontal  grade 4 astrocytoma    Oncologic History: Oncology History  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[facility])  04/04/2020 Surgery   Craniotomy, resection of left frontal mass with [name]; path is grade 4 astrocytoma, IDH-1 mutant   04/20/2020 Surgery   [place] of surgical site collection; speciates MSSA   05/17/2020 Surgery   Repeat wound exploration, irrigation.   06/07/2020 -  Radiation Therapy   IMRT and concurrent Temodar 34m/m2   08/09/2020 -  Chemotherapy   Patient is on Treatment Plan : BRAIN GLIOBLASTOMA Consolidation Temozolomide Days 1-5 q28 Days        Biomarkers:  MGMT Unknown.  IDH 1/2 Mutated.  EGFR Unknown  TERT Unknown   Interval History:  Gordon Gordon [name] to clinic for follow up today, now having completed cycle #11 of adjuvant Temodar.  Denies new or progressive deficits.  No nausea or vomiting this month with chemo.  Continues to work bCatering manager  H+P (04/12/20) Patient presented to medical attention three weeks prior with episode of loss of awareness and generalized shaking consistent with seizure episode.  CNS imaging demonstrated an enhancing left frontal mass, which was resected on 04/04/20 by [name]  He tolerated surgery well, has no significant complaints at this time.  Last day of decadron taper is today.  He has been more or less independent at home, starting to resume some of his studies at school.  Currently a business student at [employer]  No other significant medical history.  Medications: Current Outpatient Medications on File Prior to Visit  Medication Sig Dispense  Refill   doxycycline (VIBRA-TABS) 100 MG tablet Take 1 tablet (100 mg total) by mouth 2 (two) times daily. 120 tablet 0   levETIRAcetam (KEPPRA) 750 MG tablet Take 1 tablet (750 mg total) by mouth 2 (two) times daily. 60 tablet 5   methylphenidate (RITALIN) 10 MG tablet Take 1 tablet by mouth 2 times daily. 60 tablet 0   metoCLOPramide (REGLAN) 10 MG tablet Take 1 tablet (10 mg total) by mouth every 6 (six) hours as needed for nausea. 60 tablet 1   Multiple Vitamin (MULTIVITAMIN PO) Take 1 capsule by mouth daily.     Omega-3 Fatty Acids (OMEGA 3 PO) Take 1 capsule by mouth daily.     ondansetron (ZOFRAN) 8 MG tablet Take 1 tablet (8 mg total) by mouth 2 (two) times daily as needed (nausea and vomiting). May take 30-60 minutes prior to Temodar administration if nausea/vomiting occurs. (Patient not taking: Reported on 06/09/2021) 30 tablet 1   Probiotic Product (PROBIOTIC PO) Take 1 capsule by mouth daily.     prochlorperazine (COMPAZINE) 10 MG tablet TAKE 1 TABLET(10 MG) BY MOUTH EVERY 6 HOURS AS NEEDED 60 tablet 1   temozolomide (TEMODAR) 140 MG capsule TAKE 3 CAPSULES (420MG) BY MOUTH AT BEDTIME FOR 5 DAYS OF 28 DAY  CYCLE 15 capsule 0   No current facility-administered medications on file prior to visit.    Allergies: No Known Allergies Past Medical History:  Past Medical History:  Diagnosis Date   Bulging lumbar disc    Complication of anesthesia    PONV (postoperative nausea and  vomiting)    Vomited x1 after craniotomy surgery   Seizure ([facility]) 04/03/2020   x 1 at home pre-admission.  Otherwise, no history.   Past Surgical History:  Past Surgical History:  Procedure Laterality Date   BURR HOLE Right 04/20/2020   Procedure: Irrigation and Debridement of Cranial Wound;  Surgeon: [name], MD;  Location: [facility];  Service: Neurosurgery;  Laterality: Right;   CRANIOTOMY Left 04/04/2020   Procedure: LEFT CRANIOTOMY FOR TUMOR EXCISION;  Surgeon: [name], MD;  Location:  [facility];  Service: Neurosurgery;  Laterality: Left;   TONSILLECTOMY     WISDOM TOOTH EXTRACTION     WOUND EXPLORATION Left 05/17/2020   Procedure: CRANIAL WOUND EXPLORATION WITH PLACEMENT OF MESH;  Surgeon: [name], MD;  Location: [facility];  Service: Neurosurgery;  Laterality: Left;   Social History:  Social History   Socioeconomic History   Marital status: Single    Spouse name: Not on file   Number of children: 0   Years of education: Not on file   Highest education level: Not on file  Occupational History   Not on file  Tobacco Use   Smoking status: Never   Smokeless tobacco: Never  Vaping Use   Vaping Use: Never used  Substance and Sexual Activity   Alcohol use: No    Alcohol/week: 0.0 standard drinks of alcohol   Drug use: No   Sexual activity: Yes    Birth control/protection: Condom  Other Topics Concern   Not on file  Social History Narrative   Not on file   Social Determinants of Health   Financial Resource Strain: Not on file  Food Insecurity: Not on file  Transportation Needs: Not on file  Physical Activity: Not on file  Stress: Not on file  Social Connections: Not on file  Intimate Partner Violence: Not on file   Family History: No family history on file.  Review of Systems: Constitutional: Doesn't report fevers, chills or abnormal weight loss Eyes: Doesn't report blurriness of vision Ears, nose, mouth, throat, and face: Doesn't report sore throat Respiratory: Doesn't report cough, dyspnea or wheezes Cardiovascular: Doesn't report palpitation, chest discomfort  Gastrointestinal:  Doesn't report nausea, constipation, diarrhea GU: Doesn't report incontinence Skin: Doesn't report skin rashes Neurological: Per HPI Musculoskeletal: Doesn't report joint pain Behavioral/Psych: Doesn't report anxiety  Physical Exam: Vitals:   07/05/21 0933  BP: 128/71  Pulse: 82  Resp: 20  Temp: 98.6 F (37 C)  SpO2: 98%    KPS: 90. General: Alert,  cooperative, pleasant, in no acute distress Head: Normal EENT: No conjunctival injection or scleral icterus.  Lungs: Resp effort normal Cardiac: Regular rate Abdomen: Non-distended abdomen Skin: No rashes cyanosis or petechiae. Extremities: No clubbing or edema  Neurologic Exam: Mental Status: Awake, alert, attentive to examiner. Oriented to self and environment. Language is fluent with intact comprehension.  Cranial Nerves: Visual acuity is grossly normal. Visual fields are full. Extra-ocular movements intact. No ptosis. Face is symmetric Motor: Tone and bulk are normal. Power is full in both arms and legs. Reflexes are symmetric, no pathologic reflexes present.  Sensory: Intact to light touch Gait: Normal.   Labs: I have reviewed the data as listed    Component Value Date/Time   NA 139 07/05/2021 0925   K 4.2 07/05/2021 0925   CL 105 07/05/2021 0925   CO2 28 07/05/2021 0925   GLUCOSE 101 (H) 07/05/2021 0925   BUN 12 07/05/2021 0925   CREATININE 0.85  07/05/2021 0925   CREATININE 1.06 08/25/2020 1451   CALCIUM 10.2 07/05/2021 0925   PROT 7.6 07/05/2021 0925   ALBUMIN 4.8 07/05/2021 0925   AST 16 07/05/2021 0925   ALT 18 07/05/2021 0925   ALKPHOS 46 07/05/2021 0925   BILITOT 0.5 07/05/2021 0925   GFRNONAA >60 07/05/2021 0925   GFRAA  08/10/2009 2150    NOT CALCULATED        The eGFR has been calculated using the MDRD equation. This calculation has not been validated in all clinical situations. eGFR's persistently <60 mL/min signify possible Chronic Kidney Disease.   Lab Results  Component Value Date   WBC 4.4 07/05/2021   NEUTROABS 2.8 07/05/2021   HGB 14.7 07/05/2021   HCT 41.6 07/05/2021   MCV 87.9 07/05/2021   PLT 241 07/05/2021    Assessment/Plan 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HCC)  Seizure ([facility])  Mordechai Matuszak Student is clinically and radiographically stable today, now having completed cycle #11 of adjuvant Temodar.  No clinical changes today, labs are within  normal limits.  We recommended continuing treatment with cycle #12 Temozolomide at 200 mg/m2, on for five days and off for twenty three days in twenty eight day cycles. The patient will have a complete blood count performed on days 21 and 28 of each cycle, and a comprehensive metabolic panel performed on day 28 of each cycle. Labs may need to be performed more often. Compazine, rather than zofran will prescribed for home use for nausea/vomiting due to headaches this past month.   Chemotherapy should be held for the following:  ANC less than 1,000  Platelets less than 100,000  LFT or creatinine greater than 2x ULN  If clinical concerns/contraindications develop  Will con't reglan+compazine for nausea.  He will continue Keppra 765m BID.  Ritalin may con't 158mBID, refill ordered today.  AnDevell Parkersonranford should return to clinic following [place]commendations, pending next month.  All questions were answered. The patient knows to call the clinic with any problems, questions or concerns. No barriers to learning were detected.  The total time spent in the encounter was 30 minutes and more than 50% was on counseling and review of test results   ZaVentura SellersMD Medical Director of Neuro-Oncology [facility] WeTiro6/13/23 9:36 AM

## 2021-07-05 NOTE — Progress Notes (Unsigned)
Gordon [name] Phone: [phone] Subjective:   Gordon [name], am serving as a scribe for Dr. Hulan [name].  I'm seeing this patient by the request  of:  [name], Gordon P, DO  CC: back pain   TGG:[id number]  Gordon [name] is a 26 y.o. male coming in with complaint of back pain. Believes that he had bulging disc in lumbar spine when in high school. Most of his pain is on the R side but recently he is having pain in L leg that will radiate into L knee. Flexion increases his pain. Using Advil and Tylenol for pain relief. Did do some PT when he was in high school.    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  Patient has undergone 11 cycles of chemotherapy Scheduled for another MRI of the brain with and without contrast in July of this year.  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  Otherwise, [name] history.   Past Surgical History:  Procedure Laterality Date   BURR HOLE Right 04/20/2020   Procedure: Irrigation and Debridement of Cranial Wound;  Surgeon: Gordon Part, MD;  Location: [facility];  Service: Neurosurgery;  Laterality: Right;   CRANIOTOMY Left 04/04/2020   Procedure: LEFT CRANIOTOMY FOR TUMOR EXCISION;  Surgeon: Gordon Part, MD;  Location: [facility];  Service: Neurosurgery;  Laterality: Left;   TONSILLECTOMY     WISDOM TOOTH EXTRACTION     WOUND  EXPLORATION Left 05/17/2020   Procedure: CRANIAL WOUND EXPLORATION WITH PLACEMENT OF MESH;  Surgeon: Gordon Part, MD;  Location: [facility];  Service: Neurosurgery;  Laterality: Left;   Social History   Socioeconomic History   Marital status: Single    Spouse name: Not on file   Number of children: 0   Years of education: Not on file   Highest education level: Not on file  Occupational History   Not on file  Tobacco Use   Smoking status: Never   Smokeless tobacco: Never  Vaping Use   Vaping Use: Never used  Substance and Sexual Activity   Alcohol use: [name]    Alcohol/week: 0.0 standard drinks of alcohol   Drug use: [name]   Sexual activity: Yes    Birth control/protection: Condom  Other Topics Concern   Not on file  Social History Narrative   Not on file   Social Determinants of Health   Financial Resource Strain: Not on file  Food Insecurity: Not on file  Transportation Needs: Not on file  Physical Activity: Not on file  Stress: Not on file  Social Connections: Not on file   [name] Known Allergies [name] family history on file.       Current Outpatient Medications (Other):    doxycycline (VIBRA-TABS) 100 MG tablet, Take 1 tablet (100 mg total) by mouth 2 (two) times daily.   levETIRAcetam (KEPPRA) 750  MG tablet, Take 1 tablet (750 mg total) by mouth 2 (two) times daily.   methylphenidate (RITALIN) 10 MG tablet, Take 1 tablet by mouth 2 times daily.   metoCLOPramide (REGLAN) 10 MG tablet, Take 1 tablet (10 mg total) by mouth every 6 (six) hours as needed for nausea.   Multiple Vitamin (MULTIVITAMIN PO), Take 1 capsule by mouth daily.   Omega-3 Fatty Acids (OMEGA 3 PO), Take 1 capsule by mouth daily.   ondansetron (ZOFRAN) 8 MG tablet, Take 1 tablet (8 mg total) by mouth 2 (two) times daily as needed (nausea and vomiting). May take 30-60 minutes prior to Temodar administration if nausea/vomiting occurs.   Probiotic Product (PROBIOTIC PO), Take 1 capsule by mouth daily.    prochlorperazine (COMPAZINE) 10 MG tablet, TAKE 1 TABLET(10 MG) BY MOUTH EVERY 6 HOURS AS NEEDED   temozolomide (TEMODAR) 140 MG capsule, TAKE 3 CAPSULES ('420MG'$ ) BY MOUTH AT BEDTIME FOR 5 DAYS OF 28 DAY  CYCLE   temozolomide (TEMODAR) 140 MG capsule, Take 3 capsules (420 mg total) by mouth daily.   Reviewed prior external information including notes and imaging from  primary care provider As well as notes that were available from care everywhere and other healthcare systems.  Past medical history, social, surgical and family history all reviewed in electronic medical record.  [name] pertanent information unless stated regarding to the chief complaint.   Review of Systems:  [name]  visual changes, nausea, vomiting, diarrhea, constipation, dizziness, abdominal pain, skin rash, fevers, chills, night sweats, weight loss, swollen lymph nodes, , joint swelling, chest pain, shortness of breath, mood changes. POSITIVE muscle aches, body aches  Objective  Blood pressure 140/72, pulse 82, height '6\' 3"'$  (1.905 m), weight 224 lb (101.6 kg), SpO2 98 %.   General: [name] apparent distress alert and oriented x3 mood and affect normal, dressed appropriately.  Patient has some postsurgical changes to the cranium HEENT: Pupils equal, extraocular movements intact  Respiratory: Patient's speak in full sentences and does not appear short of breath  Cardiovascular: [name] lower extremity edema, non tender, [name] erythema  Low back exam does have some loss of lordosis.  Positive straight leg test at 20 degrees of forward flexion on the left side.  Patient does have significant symptoms of flexion with 4 out of 5 strength compared to the contralateral side.  1+ DTR of the contralateral side has a relatively similar.    Impression and Recommendations:     The above documentation has been reviewed and is accurate and complete Lyndal Pulley, DO

## 2021-07-06 ENCOUNTER — Ambulatory Visit (INDEPENDENT_AMBULATORY_CARE_PROVIDER_SITE_OTHER): Payer: 59

## 2021-07-06 ENCOUNTER — Ambulatory Visit: Payer: 59 | Admitting: Family Medicine

## 2021-07-06 VITALS — BP 140/72 | HR 82 | Ht 75.0 in | Wt 224.0 lb

## 2021-07-06 DIAGNOSIS — M545 Low back pain, unspecified: Secondary | ICD-10-CM

## 2021-07-06 DIAGNOSIS — M5416 Radiculopathy, lumbar region: Secondary | ICD-10-CM | POA: Diagnosis not present

## 2021-07-06 NOTE — Assessment & Plan Note (Signed)
Patient has had intermittent pain for quite some time but in acute pain now on the left side.  Positive straight leg test noted today and patient does have some weakness with dorsiflexion of the foot noted.  Patient may even have mild decrease in DTR of the Achilles.  Patient also has a past medical history significant for glioma.  Even though having rare continued need to possibly get advanced imaging to make sure there is no metastasis.  After imaging we will discuss further treatment option.

## 2021-07-06 NOTE — Patient Instructions (Signed)
Xray today MRI lumbar spine [phone] We will be in touch with next steps

## 2021-07-11 ENCOUNTER — Ambulatory Visit
Admission: RE | Admit: 2021-07-11 | Discharge: 2021-07-11 | Disposition: A | Discharge status: Home / Self Care | Payer: 59 | Source: Ambulatory Visit | Attending: Family Medicine | Admitting: Family Medicine

## 2021-07-11 DIAGNOSIS — M545 Low back pain, unspecified: Secondary | ICD-10-CM

## 2021-07-13 ENCOUNTER — Encounter: Payer: Self-pay | Admitting: Family Medicine

## 2021-07-13 ENCOUNTER — Other Ambulatory Visit: Payer: Self-pay | Admitting: Family Medicine

## 2021-07-13 DIAGNOSIS — M545 Low back pain, unspecified: Secondary | ICD-10-CM

## 2021-07-13 MED ORDER — TIZANIDINE HCL 2 MG PO TABS: 2.0000 mg | ORAL_TABLET | Freq: Every day | ORAL | 0 refills | Status: AC

## 2021-07-13 MED ORDER — MELOXICAM 15 MG PO TABS
15.0000 mg | ORAL_TABLET | Freq: Every day | ORAL | 0 refills | Status: AC | PRN
Start: 2021-07-13 — End: ?

## 2021-07-18 ENCOUNTER — Ambulatory Visit
Admission: RE | Admit: 2021-07-18 | Discharge: 2021-07-18 | Disposition: A | Discharge status: Home / Self Care | Payer: 59 | Source: Ambulatory Visit | Attending: Family Medicine | Admitting: Family Medicine

## 2021-07-18 DIAGNOSIS — M545 Low back pain, unspecified: Secondary | ICD-10-CM

## 2021-07-18 MED ORDER — IOPAMIDOL (ISOVUE-M 200) INJECTION 41%
1.0000 mL | Freq: Once | INTRAMUSCULAR | Status: AC
  Administered 2021-07-18: 1 mL via EPIDURAL

## 2021-07-18 MED ORDER — METHYLPREDNISOLONE ACETATE 40 MG/ML INJ SUSP (RADIOLOG
80.0000 mg | Freq: Once | INTRAMUSCULAR | Status: AC
  Administered 2021-07-18: 80 mg via EPIDURAL

## 2021-07-23 ENCOUNTER — Other Ambulatory Visit: Payer: Self-pay | Admitting: Internal Medicine

## 2021-07-26 ENCOUNTER — Other Ambulatory Visit: Payer: Self-pay | Admitting: Internal Medicine

## 2021-07-26 DIAGNOSIS — C719 Malignant neoplasm of brain, unspecified: Secondary | ICD-10-CM

## 2021-07-28 ENCOUNTER — Ambulatory Visit (HOSPITAL_COMMUNITY)
Admission: RE | Admit: 2021-07-28 | Discharge: 2021-07-28 | Disposition: A | Discharge status: Home / Self Care | Payer: 59 | Source: Ambulatory Visit | Attending: Internal Medicine | Admitting: Internal Medicine

## 2021-07-28 DIAGNOSIS — C719 Malignant neoplasm of brain, unspecified: Secondary | ICD-10-CM | POA: Insufficient documentation

## 2021-07-28 MED ORDER — GADOBUTROL 1 MMOL/ML IV SOLN
10.0000 mL | Freq: Once | INTRAVENOUS | Status: AC | PRN
  Administered 2021-07-28: 10 mL via INTRAVENOUS

## 2021-08-01 ENCOUNTER — Other Ambulatory Visit: Payer: Self-pay | Admitting: *Deleted

## 2021-08-01 ENCOUNTER — Inpatient Hospital Stay: Payer: 59 | Attending: Internal Medicine

## 2021-08-01 MED ORDER — PROCHLORPERAZINE MALEATE 10 MG PO TABS: ORAL_TABLET | ORAL | 1 refills | Status: DC

## 2021-08-02 ENCOUNTER — Encounter: Payer: Self-pay | Admitting: Internal Medicine

## 2021-08-04 ENCOUNTER — Encounter: Payer: Self-pay | Admitting: Internal Medicine

## 2021-08-04 ENCOUNTER — Encounter: Payer: Self-pay | Admitting: *Deleted

## 2021-08-15 ENCOUNTER — Other Ambulatory Visit: Payer: Self-pay

## 2021-08-17 ENCOUNTER — Other Ambulatory Visit: Payer: Self-pay

## 2021-08-17 ENCOUNTER — Ambulatory Visit (INDEPENDENT_AMBULATORY_CARE_PROVIDER_SITE_OTHER): Payer: 59 | Admitting: Internal Medicine

## 2021-08-17 ENCOUNTER — Encounter: Payer: Self-pay | Admitting: Internal Medicine

## 2021-08-17 VITALS — BP 137/79 | HR 77 | Temp 98.4°F | Ht 75.0 in | Wt 227.0 lb

## 2021-08-17 DIAGNOSIS — T847XXD Infection and inflammatory reaction due to other internal orthopedic prosthetic devices, implants and grafts, subsequent encounter: Secondary | ICD-10-CM

## 2021-08-17 DIAGNOSIS — M869 Osteomyelitis, unspecified: Secondary | ICD-10-CM

## 2021-08-17 MED ORDER — DOXYCYCLINE HYCLATE 100 MG PO TABS: 100.0000 mg | ORAL_TABLET | Freq: Every day | ORAL | 3 refills | Status: DC

## 2021-08-17 NOTE — Patient Instructions (Signed)
You are doing well. Lets continue doxycycline but only 1 tablet once a day   If you are to do labs within the next several months, please do cbc, cmp, and crp   Otherwise we can do those on next ID clinic visit which will be 6 months from now

## 2021-08-17 NOTE — Progress Notes (Signed)
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   MRN: [id number]  Chief Complaint  Patient presents with   Follow-up    Osteomyelitis, unspecified site, unspecified type     HPI:  Brick Ketcher is a 26 y.o. 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 cx the second time MRSE and mssa but no p. Acnes  He is doing well on mino/rifampin No seizure. Just taking keppra for seizure  Chemo tx of tumor planned soon once infection controlled and wound healed.   07/01/2020 id clinic f/u Patient had been continued on chemo for his brain cancer Doing well so far No issue with surgical site/titanium mesh wound infection No n/v/diarrhea No joint pain  No pigmentation   08/25/2020 id clinic f/u Patient is s/p 3 months of rifampin/minocycline for surgical site related mrse/mssa hardware related OM of the craniotomy site Previous crp improving Tolerating abx without n/v/diarrhea/rash/myalgia/arthralgia Chemotherapy going well; will be on that for the next year, 28 day cycle. No end date on duration except 1 year of these 28 day cycles.  11/02 id f/u Tolerating chemo Tolerating doxy No n/v/diarrhea No focal weakness/numbness No headache No visual change  No complaint today   05/25/21 id clinic f/u He saw my partner in  02/24/21 doing well, and continued on doxycycline He is on cycle 10 of chemo for glioblastoma in the next couple weeks. He is clinically/radiologically stable per his oncologic doctor assessment and will get repeat brain imaging within the next few weeks  He has no complaint today  08/17/21 id clinic f/u Doing well No recent labs Finished last chemo round recently no further chemo planned. Mri brain to be done 09/2021  Tolerating doxy    No Known Allergies    Outpatient Medications Prior to Visit  Medication Sig Dispense Refill   doxycycline (VIBRA-TABS) 100 MG tablet Take 1 tablet (100 mg total) by mouth 2 (two) times daily. 120 tablet 0   levETIRAcetam (KEPPRA) 750 MG tablet Take 1 tablet (750 mg total) by mouth 2 (two) times daily. 60 tablet 5   meloxicam (MOBIC) 15 MG tablet Take 1 tablet (15 mg total) by mouth daily as needed for pain. 30 tablet 0   methylphenidate (RITALIN) 10 MG tablet Take 1 tablet by mouth 2 times daily. 60 tablet 0   Multiple Vitamin (MULTIVITAMIN PO) Take 1 capsule by mouth daily.     Omega-3 Fatty Acids (OMEGA 3 PO) Take 1 capsule by mouth daily.     Probiotic Product (PROBIOTIC PO) Take 1 capsule by mouth daily.     tiZANidine (ZANAFLEX) 2 MG tablet Take 1 tablet (2 mg total) by mouth at bedtime. 30 tablet 0   metoCLOPramide (REGLAN) 10 MG tablet Take 1 tablet (10 mg total) by mouth every  6 (six) hours as needed for nausea. (Patient not taking: Reported on 08/17/2021) 60 tablet 1   ondansetron (ZOFRAN) 8 MG tablet Take 1 tablet (8 mg total) by mouth 2 (two) times daily as needed (nausea and vomiting). May take 30-60 minutes prior to Temodar administration if nausea/vomiting occurs. (Patient not taking: Reported on 08/17/2021) 30 tablet 1   prochlorperazine (COMPAZINE) 10 MG tablet TAKE 1 TABLET(10 MG) BY MOUTH EVERY 6 HOURS AS NEEDED (Patient not taking: Reported on 08/17/2021) 60 tablet 1   prochlorperazine (COMPAZINE) 10 MG tablet TAKE 1 TABLET(10 MG) BY MOUTH  EVERY 6 HOURS AS NEEDED (Patient not taking: Reported on 08/17/2021) 60 tablet 1   temozolomide (TEMODAR) 140 MG capsule TAKE 3 CAPSULES (420MG) BY MOUTH AT BEDTIME FOR 5 DAYS OF 28 DAY  CYCLE (Patient not taking: Reported on 08/17/2021) 15 capsule 0   temozolomide (TEMODAR) 140 MG capsule TAKE 3 CAPSULES (420MG) BY MOUTH DAILY FOR 5 DAYS OF 28 DAY CYCLE (Patient not taking: Reported on 08/17/2021) 15 capsule 0   No facility-administered medications prior to visit.     Social History   Socioeconomic History   Marital status: Single    Spouse name: Not on file   Number of children: 0   Years of education: Not on file   Highest education level: Not on file  Occupational History   Not on file  Tobacco Use   Smoking status: Never   Smokeless tobacco: Never  Vaping Use   Vaping Use: Never used  Substance and Sexual Activity   Alcohol use: No    Alcohol/week: 0.0 standard drinks of alcohol   Drug use: No   Sexual activity: Yes    Birth control/protection: Condom  Other Topics Concern   Not on file  Social History Narrative   Not on file   Social Determinants of Health   Financial Resource Strain: Not on file  Food Insecurity: Not on file  Transportation Needs: Not on file  Physical Activity: Not on file  Stress: Not on file  Social Connections: Not on file  Intimate Partner Violence: Not on file      Review of Systems    All other ros negative  Objective:    BP 137/79   Pulse 77   Temp 98.4 F (36.9 C) (Oral)   Ht 6' 3"  (1.905 m)   Wt 227 lb (103 kg)   BMI 28.37 kg/m  Nursing note and vital signs reviewed.  Physical Exam General/constitutional: no distress, pleasant HEENT: Normocephalic, PER, Conj Clear, EOMI, Oropharynx clear Neck supple CV: rrr no mrg Lungs: clear to auscultation, normal respiratory effort Abd: Soft, Nontender Ext: no edema Skin: No Rash  Neuro: nonfocal -- healed craniotomy site MSK: no peripheral joint swelling/tenderness/warmth;  back spines nontender           Labs: Lab Results  Component Value Date   WBC 4.4 07/05/2021   HGB 14.7 07/05/2021   HCT 41.6 07/05/2021   MCV 87.9 07/05/2021   PLT 241 18/29/9371   Last metabolic panel Lab Results  Component Value Date   GLUCOSE 101 (H) 07/05/2021   NA 139 07/05/2021   K 4.2 07/05/2021   CL 105 07/05/2021   CO2 28 07/05/2021   BUN 12 07/05/2021   CREATININE 0.85 07/05/2021   GFRNONAA >60 07/05/2021   GFRAA  08/10/2009    NOT CALCULATED        The eGFR has been calculated using the MDRD equation. This calculation has not  been validated in all clinical situations. eGFR's persistently <60 mL/min signify possible Chronic Kidney Disease.   CALCIUM 10.2 07/05/2021   PROT 7.6 07/05/2021   ALBUMIN 4.8 07/05/2021   BILITOT 0.5 07/05/2021   ALKPHOS 46 07/05/2021   AST 16 07/05/2021   ALT 18 07/05/2021   ANIONGAP 6 07/05/2021   Crp: 02/24/21   1.9 8/03  0.9 5/11  1.2 4/23  5.1  Micro: 4/25 OR debridement cx Mrse (S bactrim, tetra) Mssa (S bactrim, tetra)  Serology:  Imaging: 05/15/20 mri brain wwo contrast IMPRESSION: 1. Status post left frontal tumor resection with decreased blood products at the resection site. Nonspecific contrast enhancement surrounding the resection cavity may be postsurgical. 2. Decreased size of scalp fluid collection overlying the craniotomy site. No specific features of infection. 3. Dural thickening underlying the craniotomy, likely reactive.    03/2021 mri brain Brain: Hemosiderin lined left frontal resection cavity involving the superior frontal gyrus. There remains linear enhancement along the cavity margins primarily anteriorly. No new nodular enhancement. Extent of surrounding T2 FLAIR hyperintensity with involvement of the ventral body of the corpus callosum is unchanged.   There is no acute infarction. There is no hydrocephalus or extra-axial fluid collection.   Vascular: Major vessel flow voids  at the skull base are preserved.   Skull and upper cervical spine: Normal marrow signal is preserved. Left frontal revised craniotomy with mesh cranioplasty.   Sinuses/Orbits: Lobular right greater than left maxillary sinus mucosal thickening. Orbits are unremarkable.   Other: Sella is unremarkable.  Mastoid air cells are clear.   IMPRESSION: Stable appearance.  No evidence of disease progression.   Assessment & Plan:   Problem List Items Addressed This Visit       Other   Hardware complicating wound infection ([place])   Other Visit Diagnoses     Osteomyelitis, unspecified site, unspecified type ([facility])    -  Primary     Abx: 08/25/20-c doxycycline  4/26-8/03 mino 4/26-8/03 rifampin (300 rif bid)  26 yo male hx brain tumore s/p excision 3/13 with placement of titanium plate, complicated by post op wound infection s/o I&D 3/29. Initial cx mssa and p-acnes s/p short course of abx, but readmitted for ongoing infection s/p repeat I&D 4/25 with placement of titanium mesh placement  Continue to do well Tolerating antibiotics Labs appear without abx toxicity. Crp not checked, will need to trend  Abx planned 3 months mino/rifampin until end of 07/2019, then indefinitely with doxycycline or until mesh can be removed  08/25/20 id f/u Doing well Infection appears well controlled We could stop rifampin today and continue him on indefinite tetracycyline, can plan to switch mino to doxy Patient is going to be on Temodar chemo --> no DDI on check 08/25/2020 for doxycycline  11/02 id f/u Doing well Tolerating chemo -- no side effect; plan 1 year duration   05/25/2021 id assessment He is doing well from an infectious disease standpoint He will be getting cycle 10 of chemo in a couple weeks. He states (unclear) that there is around 12 cycles planned. But repeat imaging will be needed for reassessment  We discussed again trial off abx as an option, as he is at least 6 months out. I agree  with family we could wait until chemo is done and decide this   -visit in 3 months -crp can be done with chemo lab testing -continue doxycycline    08/17/2021 id assessment Patient continues to do well No recent test but given  doxy being so benign and no sign of active infection, I don't feel we need to do blood draw today We talked about stopping medication doxy but he would like to keep on it for now   F/u 6 months   Follow-up: Return in about 6 months (around 02/17/2022).      Jabier Mutton, [place] for [place] [phone]  pager   [phone] cell 08/17/2021, 2:39 PM

## 2021-08-18 ENCOUNTER — Other Ambulatory Visit: Payer: Self-pay

## 2021-08-23 ENCOUNTER — Other Ambulatory Visit: Payer: Self-pay

## 2021-09-03 ENCOUNTER — Other Ambulatory Visit: Payer: Self-pay | Admitting: Internal Medicine

## 2021-09-05 ENCOUNTER — Encounter: Payer: Self-pay | Admitting: Internal Medicine

## 2021-09-21 ENCOUNTER — Other Ambulatory Visit: Payer: Self-pay | Admitting: Internal Medicine

## 2021-09-22 ENCOUNTER — Encounter: Payer: Self-pay | Admitting: Internal Medicine

## 2021-10-04 ENCOUNTER — Other Ambulatory Visit: Payer: Self-pay | Admitting: Internal Medicine

## 2021-10-05 ENCOUNTER — Encounter: Payer: Self-pay | Admitting: Internal Medicine

## 2021-10-06 ENCOUNTER — Other Ambulatory Visit: Payer: Self-pay | Admitting: Internal Medicine

## 2021-10-06 DIAGNOSIS — C719 Malignant neoplasm of brain, unspecified: Secondary | ICD-10-CM

## 2021-10-31 ENCOUNTER — Other Ambulatory Visit: Payer: Self-pay | Admitting: Internal Medicine

## 2021-11-24 ENCOUNTER — Ambulatory Visit (HOSPITAL_COMMUNITY)
Admission: RE | Admit: 2021-11-24 | Discharge: 2021-11-24 | Disposition: A | Discharge status: Home / Self Care | Payer: 59 | Source: Ambulatory Visit | Attending: Internal Medicine | Admitting: Internal Medicine

## 2021-11-24 DIAGNOSIS — C719 Malignant neoplasm of brain, unspecified: Secondary | ICD-10-CM | POA: Diagnosis present

## 2021-11-24 MED ORDER — GADOBUTROL 1 MMOL/ML IV SOLN
10.0000 mL | Freq: Once | INTRAVENOUS | Status: AC | PRN
  Administered 2021-11-24: 10 mL via INTRAVENOUS

## 2021-11-28 ENCOUNTER — Telehealth: Payer: Self-pay | Admitting: *Deleted

## 2021-11-28 NOTE — Telephone Encounter (Signed)
Pushed MRI Brain w wo contrast to [facility].

## 2021-11-30 ENCOUNTER — Encounter: Payer: Self-pay | Admitting: Internal Medicine

## 2021-12-03 ENCOUNTER — Other Ambulatory Visit: Payer: Self-pay | Admitting: Internal Medicine

## 2021-12-05 ENCOUNTER — Other Ambulatory Visit: Payer: Self-pay | Admitting: Internal Medicine

## 2021-12-05 ENCOUNTER — Other Ambulatory Visit: Payer: Self-pay | Admitting: Radiation Therapy

## 2021-12-05 DIAGNOSIS — C719 Malignant neoplasm of brain, unspecified: Secondary | ICD-10-CM

## 2021-12-31 ENCOUNTER — Other Ambulatory Visit: Payer: Self-pay | Admitting: Internal Medicine

## 2022-01-10 ENCOUNTER — Other Ambulatory Visit: Payer: Self-pay | Admitting: Internal Medicine

## 2022-01-19 ENCOUNTER — Telehealth: Payer: Self-pay | Admitting: *Deleted

## 2022-01-19 ENCOUNTER — Ambulatory Visit (HOSPITAL_COMMUNITY)
Admission: RE | Admit: 2022-01-19 | Discharge: 2022-01-19 | Disposition: A | Discharge status: Home / Self Care | Payer: 59 | Source: Ambulatory Visit | Attending: Internal Medicine | Admitting: Internal Medicine

## 2022-01-19 DIAGNOSIS — C719 Malignant neoplasm of brain, unspecified: Secondary | ICD-10-CM | POA: Diagnosis present

## 2022-01-19 MED ORDER — GADOBUTROL 1 MMOL/ML IV SOLN
10.0000 mL | Freq: Once | INTRAVENOUS | Status: AC | PRN
  Administered 2022-01-19: 10 mL via INTRAVENOUS

## 2022-01-19 NOTE — Telephone Encounter (Signed)
Requested that canopy push the MRI Brain from today to [facility].

## 2022-01-30 ENCOUNTER — Inpatient Hospital Stay (HOSPITAL_BASED_OUTPATIENT_CLINIC_OR_DEPARTMENT_OTHER): Payer: 59 | Admitting: Internal Medicine

## 2022-01-30 ENCOUNTER — Inpatient Hospital Stay: Payer: 59 | Attending: Internal Medicine

## 2022-01-30 VITALS — BP 132/82 | HR 90 | Temp 97.8°F | Resp 20 | Wt 249.0 lb

## 2022-01-30 DIAGNOSIS — Z923 Personal history of irradiation: Secondary | ICD-10-CM | POA: Insufficient documentation

## 2022-01-30 DIAGNOSIS — Z9221 Personal history of antineoplastic chemotherapy: Secondary | ICD-10-CM | POA: Insufficient documentation

## 2022-01-30 DIAGNOSIS — R569 Unspecified convulsions: Secondary | ICD-10-CM

## 2022-01-30 DIAGNOSIS — C719 Malignant neoplasm of brain, unspecified: Secondary | ICD-10-CM

## 2022-01-30 DIAGNOSIS — Z79899 Other long term (current) drug therapy: Secondary | ICD-10-CM | POA: Diagnosis not present

## 2022-01-30 NOTE — Progress Notes (Signed)
[facility] [place], [place] [address] [phone]   Interval Evaluation  Date of Service: 01/30/22 Patient Name: Gordon [name] Patient MRN: [id number] Patient DOB: 20-Jun-1995 Provider: Ventura Sellers, MD  Identifying Statement:  Gordon [name] is a 27 y.o. male with left frontal  grade 4 astrocytoma    Oncologic History: Oncology History  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[place])  04/04/2020 Surgery   Craniotomy, resection of left frontal mass with [name]; path is grade 4 astrocytoma, IDH-1 mutant   04/20/2020 Surgery   [place] of surgical site collection; speciates MSSA   05/17/2020 Surgery   Repeat wound exploration, irrigation.   06/07/2020 -  Radiation Therapy   IMRT and concurrent Temodar '75mg'$ /m2   08/09/2020 - 08/09/2020 Chemotherapy   Patient is on Treatment Plan : BRAIN GLIOBLASTOMA Consolidation Temozolomide Days 1-5 q28 Days        Biomarkers:  MGMT Unknown.  IDH 1/2 Mutated.  EGFR Unknown  TERT Unknown   Interval History:  Gordon [name] presents to clinic for follow up today following recent MRI brain.  No clinical changes reported. Denies headaches, seizures.  Continues to work Catering manager.  H+P (04/12/20) Patient presented to medical attention three weeks prior with episode of loss of awareness and generalized shaking consistent with seizure episode.  CNS imaging demonstrated an enhancing left frontal mass, which was resected on 04/04/20 by [name].  He tolerated surgery well, has no significant complaints at this time.  Last day of decadron taper is today.  He has been more or less independent at home, starting to resume some of his studies at school.  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.  No other significant medical history.  Medications: Current Outpatient Medications on File Prior to Visit  Medication Sig Dispense Refill   doxycycline (VIBRA-TABS) 100 MG  tablet Take 1 tablet (100 mg total) by mouth daily. 90 tablet 3   levETIRAcetam (KEPPRA) 750 MG tablet TAKE 1 TABLET(750 MG) BY MOUTH TWICE DAILY 60 tablet 5   meloxicam (MOBIC) 15 MG tablet Take 1 tablet (15 mg total) by mouth daily as needed for pain. 30 tablet 0   methylphenidate (RITALIN) 10 MG tablet Take 1 tablet by mouth 2 times daily. 60 tablet 0   Multiple Vitamin (MULTIVITAMIN PO) Take 1 capsule by mouth daily.     Omega-3 Fatty Acids (OMEGA 3 PO) Take 1 capsule by mouth daily.     Probiotic Product (PROBIOTIC PO) Take 1 capsule by mouth daily.     prochlorperazine (COMPAZINE) 10 MG tablet TAKE 1 TABLET(10 MG) BY MOUTH EVERY 6 HOURS AS NEEDED 60 tablet 1   tiZANidine (ZANAFLEX) 2 MG tablet Take 1 tablet (2 mg total) by mouth at bedtime. 30 tablet 0   No current facility-administered medications on file prior to visit.    Allergies: No Known Allergies Past Medical History:  Past Medical History:  Diagnosis Date   Bulging lumbar disc    Complication of anesthesia    PONV (postoperative nausea and vomiting)    Vomited x1 after craniotomy surgery   Seizure ([place]) 04/03/2020   x 1 at home pre-admission.  Otherwise, no history.   Past Surgical History:  Past Surgical History:  Procedure Laterality Date   BURR HOLE Right 04/20/2020   Procedure: Irrigation and Debridement of Cranial Wound;  Surgeon: [name], MD;  Location: [facility];  Service: Neurosurgery;  Laterality: Right;  CRANIOTOMY Left 04/04/2020   Procedure: LEFT CRANIOTOMY FOR TUMOR EXCISION;  Surgeon: [name], MD;  Location: [facility];  Service: Neurosurgery;  Laterality: Left;   TONSILLECTOMY     WISDOM TOOTH EXTRACTION     WOUND EXPLORATION Left 05/17/2020   Procedure: CRANIAL WOUND EXPLORATION WITH PLACEMENT OF MESH;  Surgeon: [name], MD;  Location: [facility];  Service: Neurosurgery;  Laterality: Left;   Social History:  Social History   Socioeconomic History   Marital status: Single     Spouse name: Not on file   Number of children: 0   Years of education: Not on file   Highest education level: Not on file  Occupational History   Not on file  Tobacco Use   Smoking status: Never   Smokeless tobacco: Never  Vaping Use   Vaping Use: Never used  Substance and Sexual Activity   Alcohol use: No    Alcohol/week: 0.0 standard drinks of alcohol   Drug use: No   Sexual activity: Yes    Birth control/protection: Condom  Other Topics Concern   Not on file  Social History Narrative   Not on file   Social Determinants of Health   Financial Resource Strain: Not on file  Food Insecurity: Not on file  Transportation Needs: Not on file  Physical Activity: Not on file  Stress: Not on file  Social Connections: Not on file  Intimate Partner Violence: Not on file   Family History: No family history on file.  Review of Systems: Constitutional: Doesn't report fevers, chills or abnormal weight loss Eyes: Doesn't report blurriness of vision Ears, nose, mouth, throat, and face: Doesn't report sore throat Respiratory: Doesn't report cough, dyspnea or wheezes Cardiovascular: Doesn't report palpitation, chest discomfort  Gastrointestinal:  Doesn't report nausea, constipation, diarrhea GU: Doesn't report incontinence Skin: Doesn't report skin rashes Neurological: Per HPI Musculoskeletal: Doesn't report joint pain Behavioral/Psych: Doesn't report anxiety  Physical Exam: Vitals:   01/30/22 1023  BP: 132/82  Pulse: 90  Resp: 20  Temp: 97.8 F (36.6 C)  SpO2: 100%   KPS: 90. General: Alert, cooperative, pleasant, in no acute distress Head: Normal EENT: No conjunctival injection or scleral icterus.  Lungs: Resp effort normal Cardiac: Regular rate Abdomen: Non-distended abdomen Skin: No rashes cyanosis or petechiae. Extremities: No clubbing or edema  Neurologic Exam: Mental Status: Awake, alert, attentive to examiner. Oriented to self and environment. Language is  fluent with intact comprehension.  Cranial Nerves: Visual acuity is grossly normal. Visual fields are full. Extra-ocular movements intact. No ptosis. Face is symmetric Motor: Tone and bulk are normal. Power is full in both arms and legs. Reflexes are symmetric, no pathologic reflexes present.  Sensory: Intact to light touch Gait: Normal.   Labs: I have reviewed the data as listed    Component Value Date/Time   NA 139 07/05/2021 0925   K 4.2 07/05/2021 0925   CL 105 07/05/2021 0925   CO2 28 07/05/2021 0925   GLUCOSE 101 (H) 07/05/2021 0925   BUN 12 07/05/2021 0925   CREATININE 0.85 07/05/2021 0925   CREATININE 1.06 08/25/2020 1451   CALCIUM 10.2 07/05/2021 0925   PROT 7.6 07/05/2021 0925   ALBUMIN 4.8 07/05/2021 0925   AST 16 07/05/2021 0925   ALT 18 07/05/2021 0925   ALKPHOS 46 07/05/2021 0925   BILITOT 0.5 07/05/2021 0925   GFRNONAA >60 07/05/2021 0925   GFRAA  08/10/2009 2150    NOT CALCULATED  The eGFR has been calculated using the MDRD equation. This calculation has not been validated in all clinical situations. eGFR's persistently <60 mL/min signify possible Chronic Kidney Disease.   Lab Results  Component Value Date   WBC 4.4 07/05/2021   NEUTROABS 2.8 07/05/2021   HGB 14.7 07/05/2021   HCT 41.6 07/05/2021   MCV 87.9 07/05/2021   PLT 241 07/05/2021   Imaging:  [place] Clinician Interpretation: I have personally reviewed the CNS images as listed.  My interpretation, in the context of the patient's clinical presentation, is stable disease  MR BRAIN W WO CONTRAST  Result Date: 01/21/2022 CLINICAL DATA:  Follow-up grade 4 glioma. Assess treatment response. EXAM: MRI HEAD WITHOUT AND WITH CONTRAST TECHNIQUE: Multiplanar, multiecho pulse sequences of the brain and surrounding structures were obtained without and with intravenous contrast. CONTRAST:  52m GADAVIST GADOBUTROL 1 MMOL/ML IV SOLN COMPARISON:  11/24/2021.  07/28/2021.  05/27/2021.  04/02/2020.  FINDINGS: Brain: Stable examination since the immediate prior study. No abnormality affects the brainstem or cerebellum. The right cerebral hemisphere remains normal except for the presence of some increased T2 signal in the white matter adjacent to the frontal horn of the right lateral ventricle/genu of the internal capsule which has increased slowly over time. I favor that this represents wallerian degenerative type changes from crossing fibers with atrophy subsequent to the left frontal resection. One could not absolutely exclude the possibility of tumor crossing but I think that is less likely. See axial FLAIR image 36. Previous resection of the left medial frontal mass. Post resection cavity is stable without enlargement or contraction. Adjacent T2 and FLAIR signal is stable without increasing mass effect or increasing extent. No abnormal enhancement occurs in the region. Ex vacuo enlargement of the frontal horn of the left lateral ventricle is unchanged. No evidence of distant/satellite lesion. Vascular: Major vessels at the base of the brain show flow. Skull and upper cervical spine: Negative other than the surgical approach. Sinuses/Orbits: Chronic retention cyst largely filling the right maxillary sinus. Sinuses otherwise clear. Orbits negative. Other: None IMPRESSION: 1. Stable appearance of the resection cavity in the left medial frontal lobe. Stable surrounding T2 and FLAIR signal. Stable ex vacuo enlargement of the frontal horn of the left lateral ventricle. 2. Increased T2 signal in the white matter adjacent to the frontal horn of the right lateral ventricle/genu of the internal capsule remains visible. This has increased slowly and minimally over time. I favor that this represents Wallerian degenerative type changes from crossing fibers with atrophy subsequent to the left frontal resection. One could not absolutely exclude the possibility of tumor crossing but I think that is less likely.  Electronically Signed   By: MNelson ChimesM.D.   On: 01/21/2022 09:25     Assessment/Plan 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[place]  [place] ([place]  AAdonai SelsorCranford is clinically and radiographically stable today.  No new or progressive changes.  He will continue Keppra '750mg'$  BID.  Ritalin may con't '10mg'$  BID as well.  AJeyson DeshotelCranford should return to clinic following DPearl Riverrecommendations as needed.  All questions were answered. The patient knows to call the clinic with any problems, questions or concerns. No barriers to learning were detected.  The total time spent in the encounter was 30 minutes and more than 50% was on counseling and review of test results   ZVentura Sellers MD Medical Director of Neuro-Oncology [facility]at [facility]01/08/24 10:22 AM

## 2022-02-05 ENCOUNTER — Other Ambulatory Visit: Payer: Self-pay | Admitting: Internal Medicine

## 2022-02-06 ENCOUNTER — Other Ambulatory Visit: Payer: Self-pay | Admitting: Radiation Therapy

## 2022-02-09 ENCOUNTER — Encounter: Payer: Self-pay | Admitting: Internal Medicine

## 2022-02-16 ENCOUNTER — Encounter: Payer: Self-pay | Admitting: *Deleted

## 2022-02-21 ENCOUNTER — Other Ambulatory Visit: Payer: Self-pay

## 2022-02-21 ENCOUNTER — Ambulatory Visit: Payer: 59 | Admitting: Internal Medicine

## 2022-02-21 ENCOUNTER — Encounter: Payer: Self-pay | Admitting: Internal Medicine

## 2022-02-21 VITALS — BP 142/80 | HR 81 | Temp 97.9°F | Ht 75.0 in | Wt 247.0 lb

## 2022-02-21 DIAGNOSIS — M8618 Other acute osteomyelitis, other site: Secondary | ICD-10-CM

## 2022-02-21 DIAGNOSIS — T847XXD Infection and inflammatory reaction due to other internal orthopedic prosthetic devices, implants and grafts, subsequent encounter: Secondary | ICD-10-CM | POA: Diagnosis not present

## 2022-02-21 DIAGNOSIS — M869 Osteomyelitis, unspecified: Secondary | ICD-10-CM

## 2022-02-21 NOTE — Patient Instructions (Signed)
Will get labs for you today  If everything looks good, we can revisit once a year from now on (next 01/2023). Your antibiotics should be good until 07/2022 and let me know via mychart to refill it   Thank you

## 2022-02-21 NOTE — Progress Notes (Signed)
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   MRN: [id number]  Chief Complaint  Patient presents with   Follow-up     HPI:  Murlin Schrieber is a 27 y.o. 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 cx the second time MRSE and mssa but no p. Acnes  He is doing well on mino/rifampin No seizure. Just taking keppra for seizure  Chemo tx of tumor planned soon once infection controlled and wound healed.   07/01/2020 id clinic f/u Patient had been continued on chemo for his brain cancer Doing well so far No issue with surgical site/titanium mesh wound infection No n/v/diarrhea No joint pain  No pigmentation   08/25/2020 id clinic f/u Patient is s/p 3 months of rifampin/minocycline for surgical site related mrse/mssa hardware related OM of the craniotomy site Previous crp improving Tolerating abx without n/v/diarrhea/rash/myalgia/arthralgia Chemotherapy going well; will be on that for the next year, 28 day cycle. No end date on duration except 1 year of these 28 day cycles.  11/02 id f/u Tolerating chemo Tolerating doxy No n/v/diarrhea No focal weakness/numbness No headache No visual change  No complaint today   05/25/21 id clinic f/u He saw my partner in 02/24/21 doing well, and continued on doxycycline He is on  cycle 10 of chemo for glioblastoma in the next couple weeks. He is clinically/radiologically stable per his oncologic doctor assessment and will get repeat brain imaging within the next few weeks  He has no complaint today  08/17/21 id clinic f/u Doing well No recent labs Finished last chemo round recently no further chemo planned. Mri brain to be done 09/2021  Tolerating doxy  02/21/22 id clinic f/u Patient doing well Taking doxycycline but only once a day since 08/17/21 No evidence disease of brain cancer based on last mri 01/21/22   No Known Allergies    Outpatient Medications Prior to Visit  Medication Sig Dispense Refill   doxycycline (VIBRA-TABS) 100 MG tablet Take 1 tablet (100 mg total) by mouth daily. 90 tablet 3   levETIRAcetam (KEPPRA) 750 MG tablet TAKE 1 TABLET(750 MG) BY MOUTH TWICE DAILY 60 tablet 5   meloxicam (MOBIC) 15 MG tablet Take 1 tablet (15 mg total) by mouth daily as needed for pain. 30 tablet 0   methylphenidate (RITALIN) 10 MG tablet Take 1 tablet by mouth 2 times daily. 60 tablet 0   prochlorperazine (COMPAZINE) 10 MG tablet TAKE 1 TABLET(10 MG) BY MOUTH EVERY 6 HOURS AS NEEDED 60 tablet 1   tiZANidine (ZANAFLEX) 2 MG tablet Take 1 tablet (2 mg total) by mouth at bedtime. 30 tablet 0   Multiple Vitamin (MULTIVITAMIN PO) Take 1 capsule by mouth daily. (Patient not taking: Reported on 02/21/2022)     Omega-3 Fatty Acids (OMEGA 3  PO) Take 1 capsule by mouth daily. (Patient not taking: Reported on 01/30/2022)     Probiotic Product (PROBIOTIC PO) Take 1 capsule by mouth daily. (Patient not taking: Reported on 01/30/2022)     sertraline (ZOLOFT) 25 MG tablet Take by mouth.     No facility-administered medications prior to visit.     Social History   Socioeconomic History   Marital status: Single    Spouse name: Not on file   Number of children: 0   Years of education: Not on file   Highest education level: Not on file  Occupational History   Not on file   Tobacco Use   Smoking status: Never   Smokeless tobacco: Never  Vaping Use   Vaping Use: Never used  Substance and Sexual Activity   Alcohol use: No    Alcohol/week: 0.0 standard drinks of alcohol   Drug use: No   Sexual activity: Yes    Birth control/protection: Condom  Other Topics Concern   Not on file  Social History Narrative   Not on file   Social Determinants of Health   Financial Resource Strain: Not on file  Food Insecurity: Not on file  Transportation Needs: Not on file  Physical Activity: Not on file  Stress: Not on file  Social Connections: Not on file  Intimate Partner Violence: Not on file      Review of Systems    All other ros negative  Objective:    BP (!) 142/80   Pulse 81   Temp 97.9 F (36.6 C) (Oral)   Ht '6\' 3"'$  (1.905 m)   Wt 247 lb (112 kg)   BMI 30.87 kg/m  Nursing note and vital signs reviewed.  Physical Exam General/constitutional: no distress, pleasant HEENT: Normocephalic, PER, Conj Clear, EOMI, Oropharynx clear Neck supple CV: rrr no mrg Lungs: clear to auscultation, normal respiratory effort Abd: Soft, Nontender Ext: no edema Skin: No Rash Neuro: nonfocal MSK: no peripheral joint swelling/tenderness/warmth; back spines nontender            Labs: Lab Results  Component Value Date   WBC 4.4 07/05/2021   HGB 14.7 07/05/2021   HCT 41.6 07/05/2021   MCV 87.9 07/05/2021   PLT 241 01/77/9390   Last metabolic panel Lab Results  Component Value Date   GLUCOSE 101 (H) 07/05/2021   NA 139 07/05/2021   K 4.2 07/05/2021   CL 105 07/05/2021   CO2 28 07/05/2021   BUN 12 07/05/2021   CREATININE 0.85 07/05/2021   GFRNONAA >60 07/05/2021   GFRAA  08/10/2009    NOT CALCULATED        The eGFR has been calculated using the MDRD equation. This calculation has not been validated in all clinical situations. eGFR's persistently <60 mL/min signify possible Chronic Kidney Disease.   CALCIUM 10.2 07/05/2021   PROT  7.6 07/05/2021   ALBUMIN 4.8 07/05/2021   BILITOT 0.5 07/05/2021   ALKPHOS 46 07/05/2021   AST 16 07/05/2021   ALT 18 07/05/2021   ANIONGAP 6 07/05/2021   Crp: 02/24/21   1.9 8/03  0.9 5/11  1.2 4/23  5.1  Micro: 4/25 OR debridement cx Mrse (S bactrim, tetra) Mssa (S bactrim, tetra)  Serology:  Imaging: 05/15/20 mri brain wwo contrast IMPRESSION: 1. Status post left frontal tumor resection with decreased blood products at the resection site. Nonspecific contrast enhancement surrounding the resection cavity may be postsurgical. 2. Decreased size of scalp fluid collection overlying the craniotomy site. No  specific features of infection. 3. Dural thickening underlying the craniotomy, likely reactive.    03/2021 mri brain Brain: Hemosiderin lined left frontal resection cavity involving the superior frontal gyrus. There remains linear enhancement along the cavity margins primarily anteriorly. No new nodular enhancement. Extent of surrounding T2 FLAIR hyperintensity with involvement of the ventral body of the corpus callosum is unchanged.   There is no acute infarction. There is no hydrocephalus or extra-axial fluid collection.   Vascular: Major vessel flow voids at the skull base are preserved.   Skull and upper cervical spine: Normal marrow signal is preserved. Left frontal revised craniotomy with mesh cranioplasty.   Sinuses/Orbits: Lobular right greater than left maxillary sinus mucosal thickening. Orbits are unremarkable.   Other: Sella is unremarkable.  Mastoid air cells are clear.   IMPRESSION: Stable appearance.  No evidence of disease progression.   Assessment & Plan:   Problem List Items Addressed This Visit       Other   Hardware complicating wound infection ([place])   Relevant Orders   CBC   COMPLETE METABOLIC PANEL WITH GFR   Other Visit Diagnoses     Osteomyelitis, unspecified site, unspecified type ([place])    -  Primary   Relevant Orders   CBC    COMPLETE METABOLIC PANEL WITH GFR      Abx: 08/25/20-c doxycycline (daily since 08/17/21)  4/26-8/03 mino 4/26-8/03 rifampin (300 rif bid)  27 yo male hx brain tumore s/p excision 3/13 with placement of titanium plate, complicated by post op wound infection s/o I&D 3/29. Initial cx mssa and p-acnes s/p short course of abx, but readmitted for ongoing infection s/p repeat I&D 4/25 with placement of titanium mesh placement  Continue to do well Tolerating antibiotics Labs appear without abx toxicity. Crp not checked, will need to trend  Abx planned 3 months mino/rifampin until end of 07/2019, then indefinitely with doxycycline or until mesh can be removed  08/25/20 id f/u Doing well Infection appears well controlled We could stop rifampin today and continue him on indefinite tetracycyline, can plan to switch mino to doxy Patient is going to be on Temodar chemo --> no DDI on check 08/25/2020 for doxycycline  11/02 id f/u Doing well Tolerating chemo -- no side effect; plan 1 year duration   05/25/2021 id assessment He is doing well from an infectious disease standpoint He will be getting cycle 10 of chemo in a couple weeks. He states (unclear) that there is around 12 cycles planned. But repeat imaging will be needed for reassessment  We discussed again trial off abx as an option, as he is at least 6 months out. I agree with family we could wait until chemo is done and decide this   -visit in 3 months -crp can be done with chemo lab testing -continue doxycycline    08/17/2021 id assessment Patient continues to do well No recent test but given doxy being so benign and no sign of active infection, I don't feel we need to do blood draw today We talked about stopping medication doxy but he would like to keep on it for now   F/u 6 months   02/21/22 id assessment No issue on suppressive doxy for hardware cranioplasty infection Labs today for drug toxicity monitoring He remains  clinically no evidence of further brain cancer; mri done 01/21/22 He wants to keep going with doxycycline -- he is only on once a day   Follow-up: Return in about 1 year (around 02/22/2023).  Jabier Mutton, [facility] [place] [phone]  pager   [phone] cell 02/21/2022, 2:19 PM

## 2022-02-22 LAB — CBC
HCT: 43.6 % (ref 38.5–50.0)
Hemoglobin: 15 g/dL (ref 13.2–17.1)
MCH: 29.9 pg (ref 27.0–33.0)
MCHC: 34.4 g/dL (ref 32.0–36.0)
MCV: 87 fL (ref 80.0–100.0)
MPV: 10.3 fL (ref 7.5–12.5)
Platelets: 300 10*3/uL (ref 140–400)
RBC: 5.01 10*6/uL (ref 4.20–5.80)
RDW: 12.5 % (ref 11.0–15.0)
WBC: 7.6 10*3/uL (ref 3.8–10.8)

## 2022-02-22 LAB — COMPLETE METABOLIC PANEL WITH GFR
AG Ratio: 1.6 (calc) (ref 1.0–2.5)
ALT: 22 U/L (ref 9–46)
AST: 18 U/L (ref 10–40)
Albumin: 4.9 g/dL (ref 3.6–5.1)
Alkaline phosphatase (APISO): 60 U/L (ref 36–130)
BUN: 11 mg/dL (ref 7–25)
CO2: 28 mmol/L (ref 20–32)
Calcium: 10.2 mg/dL (ref 8.6–10.3)
Chloride: 101 mmol/L (ref 98–110)
Creat: 0.88 mg/dL (ref 0.60–1.24)
Globulin: 3.1 g/dL (calc) (ref 1.9–3.7)
Glucose, Bld: 79 mg/dL (ref 65–99)
Potassium: 4.4 mmol/L (ref 3.5–5.3)
Sodium: 138 mmol/L (ref 135–146)
Total Bilirubin: 0.4 mg/dL (ref 0.2–1.2)
Total Protein: 8 g/dL (ref 6.1–8.1)
eGFR: 122 mL/min/{1.73_m2} (ref >=60)

## 2022-03-03 ENCOUNTER — Other Ambulatory Visit: Payer: Self-pay | Admitting: Internal Medicine

## 2022-03-13 ENCOUNTER — Other Ambulatory Visit: Payer: Self-pay | Admitting: Internal Medicine

## 2022-04-02 ENCOUNTER — Other Ambulatory Visit: Payer: Self-pay | Admitting: Internal Medicine

## 2022-04-14 ENCOUNTER — Encounter: Payer: Self-pay | Admitting: Genetic Counselor

## 2022-04-20 ENCOUNTER — Telehealth: Payer: Self-pay | Admitting: *Deleted

## 2022-04-20 ENCOUNTER — Ambulatory Visit (HOSPITAL_COMMUNITY)
Admission: RE | Admit: 2022-04-20 | Discharge: 2022-04-20 | Disposition: A | Discharge status: Home / Self Care | Payer: 59 | Source: Ambulatory Visit | Attending: Internal Medicine | Admitting: Internal Medicine

## 2022-04-20 DIAGNOSIS — C719 Malignant neoplasm of brain, unspecified: Secondary | ICD-10-CM

## 2022-04-20 MED ORDER — GADOBUTROL 1 MMOL/ML IV SOLN
10.0000 mL | Freq: Once | INTRAVENOUS | Status: AC | PRN
  Administered 2022-04-20: 10 mL via INTRAVENOUS

## 2022-04-20 NOTE — Telephone Encounter (Signed)
Requested today's MRI W/WO contrast pushed to [facility], spoke with Tedra Coupe.

## 2022-04-24 ENCOUNTER — Inpatient Hospital Stay: Payer: 59

## 2022-04-26 ENCOUNTER — Encounter: Payer: Self-pay | Admitting: Internal Medicine

## 2022-04-27 ENCOUNTER — Other Ambulatory Visit: Payer: Self-pay | Admitting: Internal Medicine

## 2022-04-27 DIAGNOSIS — C719 Malignant neoplasm of brain, unspecified: Secondary | ICD-10-CM

## 2022-04-28 ENCOUNTER — Encounter: Payer: Self-pay | Admitting: Internal Medicine

## 2022-04-30 ENCOUNTER — Other Ambulatory Visit: Payer: Self-pay | Admitting: Internal Medicine

## 2022-05-01 ENCOUNTER — Encounter: Payer: Self-pay | Admitting: *Deleted

## 2022-05-03 ENCOUNTER — Encounter: Payer: Self-pay | Admitting: Internal Medicine

## 2022-06-02 ENCOUNTER — Other Ambulatory Visit: Payer: Self-pay | Admitting: Internal Medicine

## 2022-06-22 IMAGING — MR MR HEAD WO/W CM
13 series · 48 of 48 positions shown · IV contrast (gadavist)
Comparison: MRI head 12/13/2020, 10/22/2020

CLINICAL DATA: Grade 4 astrocytoma follow-up.  Postop resection.

EXAM:
MRI HEAD WITHOUT AND WITH CONTRAST
TECHNIQUE: Multiplanar, multiecho pulse sequences of the brain and surrounding
structures were obtained without and with intravenous contrast.
CONTRAST:  10mL GADAVIST GADOBUTROL 1 MMOL/ML IV SOLN

[Series 5: DWI · axial · 3.0mm · 1.36mm/px · z∈[-46,+119]mm · 5 of 112 slices shown (1 of 2)]
[im 1/112]
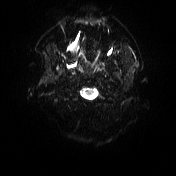
[im 28/112]
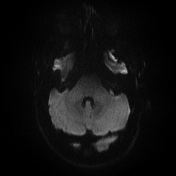
[im 56/112]
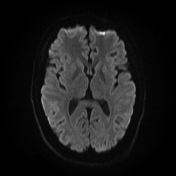
[im 84/112]
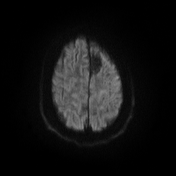
[im 112/112]
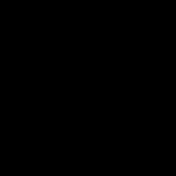

[Series 6: DWI · axial · 3.0mm · 1.36mm/px · z∈[-46,+113]mm · 3 of 54 slices shown (2 of 2)]
[im 1/54]
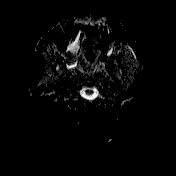
[im 27/54]
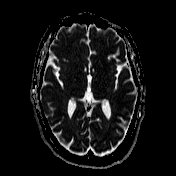
[im 54/54]
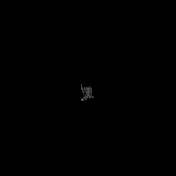

[Series 7: T1 · sagittal · 5.0mm · 0.75mm/px · 1 of 25 slices shown (1 of 2)]
[im 1/25]
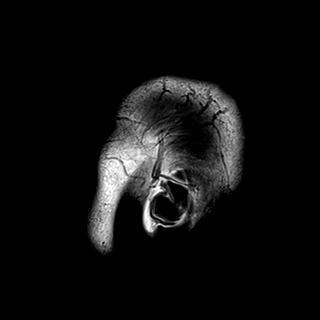

[Series 8: T2 · axial · 5.0mm · 0.62mm/px · z∈[-59,+122]mm · 2 of 29 slices shown]
[im 1/29]
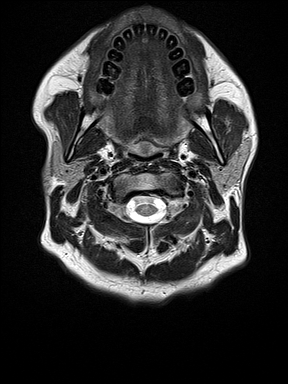
[im 29/29]
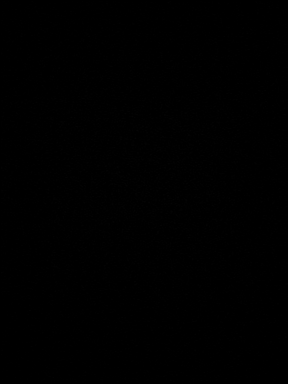

[Series 9: swi_images · axial · 3.0mm · 0.75mm/px · z∈[-57,+119]mm · 3 of 60 slices shown]
[im 1/60]
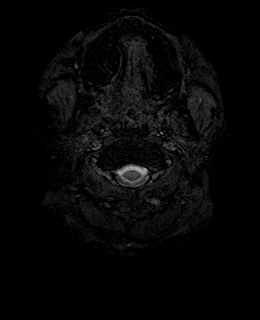
[im 30/60]
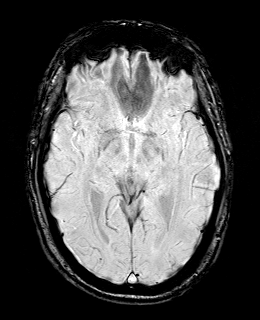
[im 60/60]
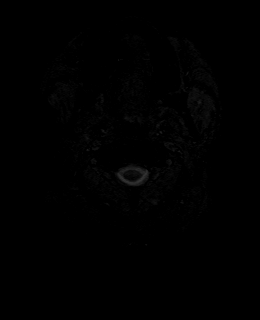

[Series 11: FLAIR · axial · 3.0mm · 0.75mm/px · z∈[-55,+118]mm · 3 of 59 slices shown]
[im 1/59]
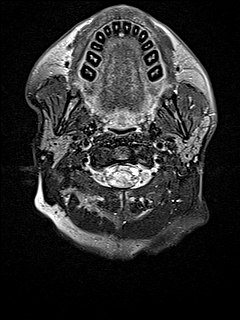
[im 30/59]
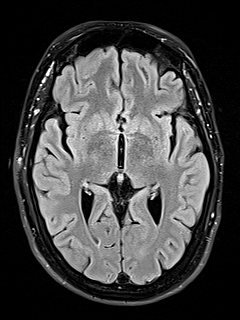
[im 59/59]
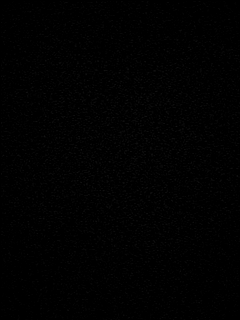

[Series 12: T1 · axial · 1.0mm · 0.94mm/px · z∈[-48,+126]mm · 10 of 175 slices shown (2 of 2)]
[im 1/175]
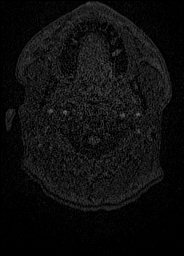
[im 20/175]
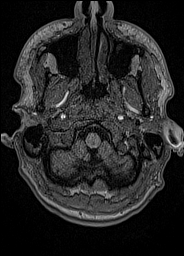
[im 39/175]
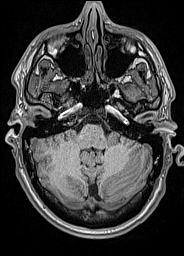
[im 59/175]
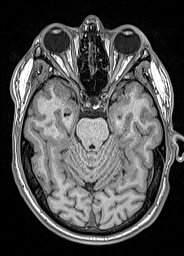
[im 78/175]
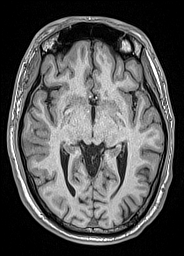
[im 97/175]
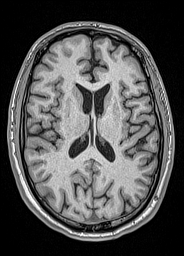
[im 117/175]
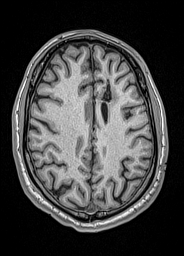
[im 136/175]
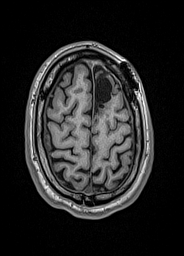
[im 155/175]
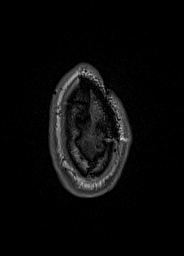
[im 175/175]
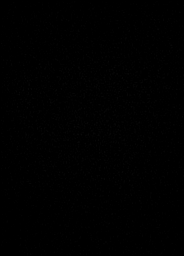

[Series 13: cor dwi_tracew · coronal · 5.0mm · 1.53mm/px · 4 of 68 slices shown]
[im 1/68]
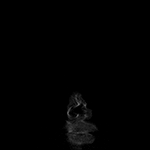
[im 23/68]
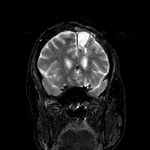
[im 45/68]
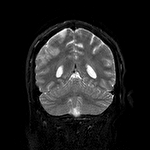
[im 68/68]
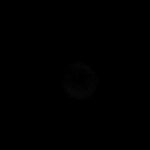

[Series 14: cor dwi_adc · coronal · 5.0mm · 1.53mm/px · 2 of 33 slices shown]
[im 1/33]
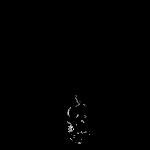
[im 33/33]
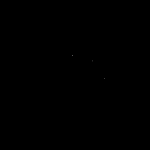

[Series 15: T2 post-contrast · coronal · 5.0mm · 0.57mm/px · 2 of 32 slices shown]
[im 1/32]
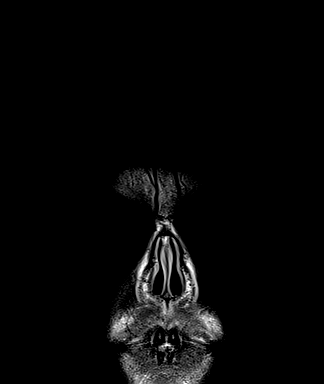
[im 32/32]
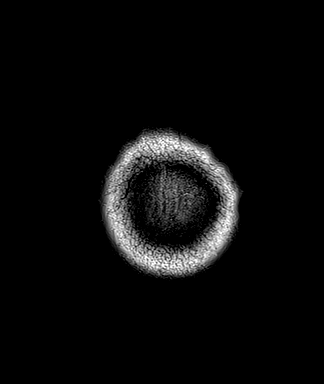

[Series 16: T1 post-contrast · axial · 1.0mm · 0.94mm/px · z∈[-48,+126]mm · 10 of 176 slices shown (1 of 3)]
[im 1/176]
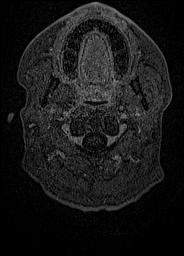
[im 20/176]
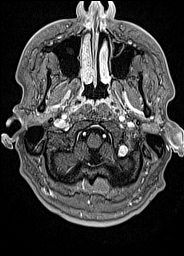
[im 39/176]
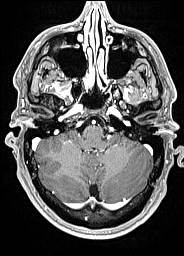
[im 59/176]
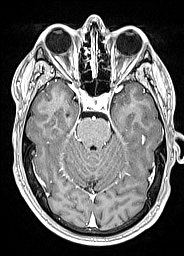
[im 78/176]
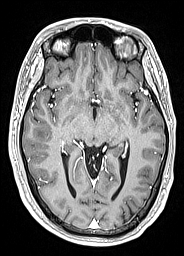
[im 98/176]
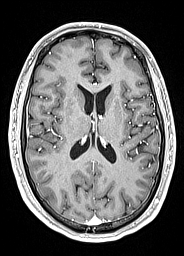
[im 117/176]
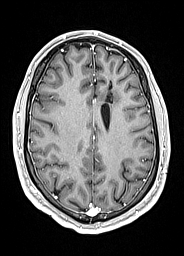
[im 137/176]
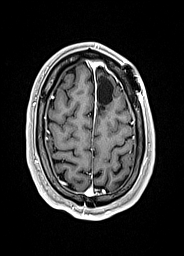
[im 156/176]
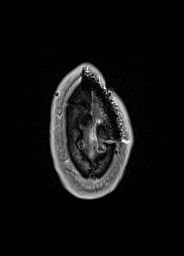
[im 176/176]
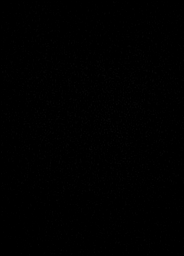

[Series 17: T1 post-contrast · coronal · 5.0mm · 0.43mm/px · 2 of 32 slices shown (2 of 3)]
[im 1/32]
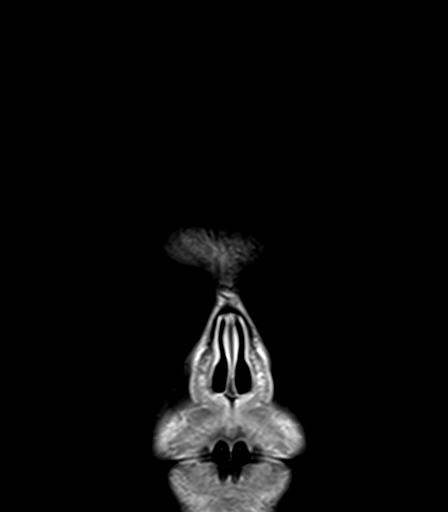
[im 32/32]
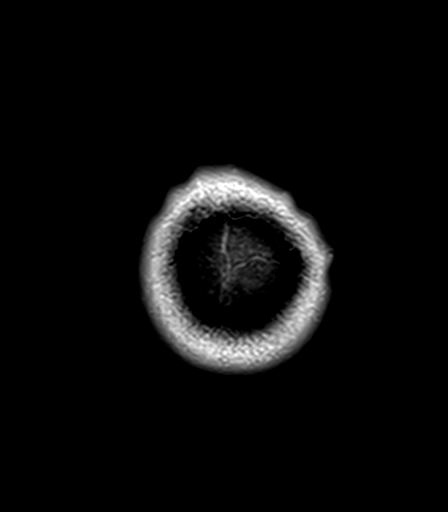

[Series 18: T1 post-contrast · sagittal · 5.0mm · 0.75mm/px · 1 of 25 slices shown (3 of 3)]
[im 1/25]
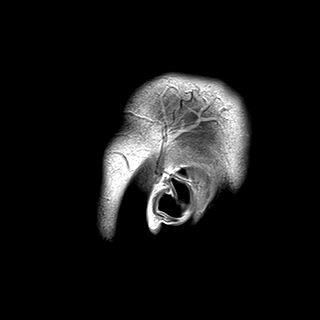

[48 of 48 positions shown; findings below may reference images not displayed]

FINDINGS: Brain: Left frontal craniotomy for tumor resection. Encephalomalacia
in the left frontal lobe is stable. Mild surrounding white matter
hyperintensity and chronic blood products. Enhancing tissue anterior
to the resection cavity is unchanged in size measuring approximately
9 x 10 mm. Enhancement may be slightly less intense on today's
study. No new or progressive area of enhancement

Ventricle size normal. No midline shift. Remaining white matter
normal.

Vascular: Normal arterial flow voids at the skull base.

Skull and upper cervical spine: Left frontal craniotomy

Sinuses/Orbits: Retention cysts in the maxillary sinus bilaterally.
Negative orbit

Other: None
IMPRESSION: Surgical resection site left frontal lobe stable. Enhancing tissue
anterior to the resection site is similar in size and may show
slightly less intense enhancement. This may be due to tumor or
radiation change.

## 2022-07-03 ENCOUNTER — Other Ambulatory Visit: Payer: Self-pay | Admitting: Internal Medicine

## 2022-07-20 ENCOUNTER — Ambulatory Visit (HOSPITAL_COMMUNITY)
Admission: RE | Admit: 2022-07-20 | Discharge: 2022-07-20 | Disposition: A | Discharge status: Home / Self Care | Payer: 59 | Source: Ambulatory Visit | Attending: Internal Medicine | Admitting: Internal Medicine

## 2022-07-20 DIAGNOSIS — C719 Malignant neoplasm of brain, unspecified: Secondary | ICD-10-CM | POA: Diagnosis present

## 2022-07-20 MED ORDER — GADOBUTROL 1 MMOL/ML IV SOLN
10.0000 mL | Freq: Once | INTRAVENOUS | Status: AC | PRN
  Administered 2022-07-20: 10 mL via INTRAVENOUS

## 2022-07-25 ENCOUNTER — Telehealth: Payer: Self-pay | Admitting: *Deleted

## 2022-07-25 NOTE — Telephone Encounter (Signed)
Email request sent to [facility] to push images of brain MRI from 07/20/22 to [facility] for upcoming appointment.

## 2022-07-28 ENCOUNTER — Encounter: Payer: Self-pay | Admitting: Internal Medicine

## 2022-07-28 DIAGNOSIS — C719 Malignant neoplasm of brain, unspecified: Secondary | ICD-10-CM

## 2022-08-03 ENCOUNTER — Encounter: Payer: Self-pay | Admitting: Genetic Counselor

## 2022-08-03 DIAGNOSIS — Z1379 Encounter for other screening for genetic and chromosomal anomalies: Secondary | ICD-10-CM | POA: Insufficient documentation

## 2022-09-03 ENCOUNTER — Other Ambulatory Visit: Payer: Self-pay | Admitting: Internal Medicine

## 2022-09-10 ENCOUNTER — Other Ambulatory Visit: Payer: Self-pay | Admitting: Internal Medicine

## 2022-09-11 NOTE — Telephone Encounter (Signed)
Okay to send refills.

## 2022-10-06 NOTE — Progress Notes (Signed)
Received request for prior authorization from [facility] for Patient's Keppra 750mg  Tablets. Submitted request to CoverMyMeds and received response that medication is on the list of covered drugs for this plan and no prior authorization is required. Notified Pharmacy.

## 2022-10-19 ENCOUNTER — Ambulatory Visit (HOSPITAL_COMMUNITY): Payer: 59

## 2022-10-20 ENCOUNTER — Ambulatory Visit (HOSPITAL_COMMUNITY)
Admission: RE | Admit: 2022-10-20 | Discharge: 2022-10-20 | Disposition: A | Discharge status: Home / Self Care | Payer: Medicare Other | Source: Ambulatory Visit | Attending: Internal Medicine | Admitting: Internal Medicine

## 2022-10-20 DIAGNOSIS — C719 Malignant neoplasm of brain, unspecified: Secondary | ICD-10-CM | POA: Diagnosis present

## 2022-10-20 MED ORDER — GADOBUTROL 1 MMOL/ML IV SOLN
10.0000 mL | Freq: Once | INTRAVENOUS | Status: AC | PRN
  Administered 2022-10-20: 10 mL via INTRAVENOUS

## 2022-10-23 ENCOUNTER — Encounter: Payer: Self-pay | Admitting: *Deleted

## 2022-10-23 NOTE — Progress Notes (Signed)
Requested MRI Brain from 10/20/2022 to be pushed to [facility]

## 2022-10-26 ENCOUNTER — Encounter: Payer: Self-pay | Admitting: Internal Medicine

## 2022-10-26 DIAGNOSIS — C719 Malignant neoplasm of brain, unspecified: Secondary | ICD-10-CM

## 2022-11-24 ENCOUNTER — Other Ambulatory Visit (HOSPITAL_COMMUNITY): Payer: Self-pay | Admitting: Orthopedic Surgery

## 2022-11-24 DIAGNOSIS — M5126 Other intervertebral disc displacement, lumbar region: Secondary | ICD-10-CM

## 2022-11-24 DIAGNOSIS — G8929 Other chronic pain: Secondary | ICD-10-CM

## 2022-11-24 DIAGNOSIS — M5116 Intervertebral disc disorders with radiculopathy, lumbar region: Secondary | ICD-10-CM

## 2022-11-30 ENCOUNTER — Ambulatory Visit (HOSPITAL_COMMUNITY)
Admission: RE | Admit: 2022-11-30 | Discharge: 2022-11-30 | Disposition: A | Discharge status: Home / Self Care | Payer: BC Managed Care – PPO | Source: Ambulatory Visit | Attending: Orthopedic Surgery | Admitting: Orthopedic Surgery

## 2022-11-30 DIAGNOSIS — M5116 Intervertebral disc disorders with radiculopathy, lumbar region: Secondary | ICD-10-CM | POA: Diagnosis present

## 2022-11-30 DIAGNOSIS — G8929 Other chronic pain: Secondary | ICD-10-CM | POA: Diagnosis present

## 2022-11-30 DIAGNOSIS — M5442 Lumbago with sciatica, left side: Secondary | ICD-10-CM | POA: Insufficient documentation

## 2022-11-30 DIAGNOSIS — M5126 Other intervertebral disc displacement, lumbar region: Secondary | ICD-10-CM | POA: Insufficient documentation

## 2023-01-22 ENCOUNTER — Ambulatory Visit (HOSPITAL_COMMUNITY)
Admission: RE | Admit: 2023-01-22 | Discharge: 2023-01-22 | Disposition: A | Discharge status: Home / Self Care | Payer: BC Managed Care – PPO | Source: Ambulatory Visit | Attending: Internal Medicine | Admitting: Internal Medicine

## 2023-01-22 ENCOUNTER — Encounter: Payer: Self-pay | Admitting: *Deleted

## 2023-01-22 DIAGNOSIS — C719 Malignant neoplasm of brain, unspecified: Secondary | ICD-10-CM | POA: Diagnosis present

## 2023-01-22 MED ORDER — GADOBUTROL 1 MMOL/ML IV SOLN
7.0000 mL | Freq: Once | INTRAVENOUS | Status: AC | PRN
  Administered 2023-01-22: 7 mL via INTRAVENOUS

## 2023-01-22 NOTE — Progress Notes (Signed)
Requested MRI to be pushed to [facility].

## 2023-01-23 ENCOUNTER — Telehealth: Payer: Self-pay | Admitting: *Deleted

## 2023-01-23 NOTE — Telephone Encounter (Signed)
Requested that images from brain MRI on 01/22/23 be pushed to [facility] through [employer].

## 2023-02-12 ENCOUNTER — Inpatient Hospital Stay: Payer: BC Managed Care – PPO | Attending: Internal Medicine | Admitting: Internal Medicine

## 2023-02-12 VITALS — BP 112/75 | HR 87 | Temp 97.7°F | Resp 17 | Ht 75.0 in | Wt 214.5 lb

## 2023-02-12 DIAGNOSIS — C719 Malignant neoplasm of brain, unspecified: Secondary | ICD-10-CM | POA: Insufficient documentation

## 2023-02-12 DIAGNOSIS — Z79899 Other long term (current) drug therapy: Secondary | ICD-10-CM | POA: Diagnosis not present

## 2023-02-12 DIAGNOSIS — R569 Unspecified convulsions: Secondary | ICD-10-CM | POA: Insufficient documentation

## 2023-02-12 NOTE — Progress Notes (Signed)
[facility] [address]  [place], [place] [address] [phone]   Interval Evaluation  Date of Service: 02/12/23 Patient Name: Gordon [name] Patient MRN: [id number] Patient DOB: 12/14/1995 Provider: Henreitta Leber, MD  Identifying Statement:  Gordon [name] is a 28 y.o. male with left frontal  grade 4 astrocytoma    Oncologic History: Oncology History  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HCC)  04/04/2020 Surgery   Craniotomy, resection of left frontal mass with Dr. Maurice Small; path is grade 4 astrocytoma, IDH-1 mutant   04/20/2020 Surgery   Washout of surgical site collection; speciates MSSA   05/17/2020 Surgery   Repeat wound exploration, irrigation.   06/07/2020 -  Radiation Therapy   IMRT and concurrent Temodar 75mg /m2   08/09/2020 - 08/09/2020 Chemotherapy   Patient is on Treatment Plan : BRAIN GLIOBLASTOMA Consolidation Temozolomide Days 1-5 q28 Days      07/25/2022 Genetic Testing   Negative genetic testing on the Multi-cancer + RNA + Brain tumor panel. KIF1B c.1456C>G (p.Pro486Ala) VUS identified.  The report date is 08/01/2022.  The Multi-Cancer + RNA Panel + Brain tumor panel offered by [facility] includes sequencing and/or deletion/duplication analysis of the following 86 genes:  AIP, ALK, APC*, ATM*, AXIN2, BAP1, BARD1, BLM, BMPR1A, BRCA1, BRCA2, BRIP1, CDC73, CDH1, CDK4, CDKN1B, CDKN1C, CDKN2A (p14ARF), CDKN2A (p16INK4a), CHEK2, CTNNA1, DICER1*, DLST, EGFR, EGLN1*, ELP1, EPAS1*, EPCAM*, EZH2*, FH*, FLCN, GPC3*, GPR161, GREM1*, HOXB13, HRAS, KIF1B*, KIT, LZTR1, MAX*, MBD4, MDH2, MEN1*, MET*, MITF, MLH1*, MSH2*, MSH3*, MSH6*, MUTYH, NBN, NF1*, NF2, NTHL1, PALB2, PDGFRA, PHOX2B*, PMS2*, POLD1*, POLE, POT1, PRKAR1A, PTCH1, PTCH2, PTEN*, RAD51C, RAD51D, RB1*, RET, SDHA*, SDHAF2, SDHB, SDHC*, SDHD, SLC25A11, SMAD4, SMARCA4, SMARCB1, SMARCE1, STK11, SUCLG2, SUFU, TMEM127, TP53, TSC1*, TSC2, and VHL. RNA analysis is performed for * genes.       Biomarkers:  MGMT Unknown.  IDH 1/2 Mutated.  EGFR Unknown  TERT Unknown   Interval History:  Gordon [name] presents to clinic for follow up today following recent MRI brain.  No new or progressive neurologic deficits. Denies headaches, seizures.  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  H+P (04/12/20) Patient presented to medical attention three weeks prior with episode of loss of awareness and generalized shaking consistent with seizure episode.  CNS imaging demonstrated an enhancing left frontal mass, which was resected on 04/04/20 by Dr. Maurice Small.  He tolerated surgery well, has no significant complaints at this time.  Last day of decadron taper is today.  He has been more or less independent at home, starting to resume some of his studies at school.  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  No other significant medical history.  Medications: Current Outpatient Medications on File Prior to Visit  Medication Sig Dispense Refill   doxycycline (VIBRA-TABS) 100 MG tablet TAKE 1 TABLET(100 MG) BY MOUTH DAILY 90 tablet 0   levETIRAcetam (KEPPRA) 750 MG tablet TAKE 1 TABLET(750 MG) BY MOUTH TWICE DAILY 60 tablet 5   meloxicam (MOBIC) 15 MG tablet Take 1 tablet (15 mg total) by mouth daily as needed for pain. 30 tablet 0   methylphenidate (RITALIN) 10 MG tablet Take 1 tablet by mouth 2 times daily. 60 tablet 0   Multiple Vitamin (MULTIVITAMIN PO) Take 1 capsule by mouth daily. (Patient not taking: Reported on 02/21/2022)     Omega-3 Fatty Acids (OMEGA 3 PO) Take 1 capsule by mouth daily. (Patient not taking: Reported on 01/30/2022)     Probiotic Product (PROBIOTIC PO) Take 1  capsule by mouth daily. (Patient not taking: Reported on 01/30/2022)     prochlorperazine (COMPAZINE) 10 MG tablet TAKE 1 TABLET(10 MG) BY MOUTH EVERY 6 HOURS AS NEEDED 60 tablet 1   sertraline (ZOLOFT) 25 MG tablet Take by mouth.     tiZANidine (ZANAFLEX) 2 MG tablet Take 1 tablet (2 mg total) by mouth  at bedtime. 30 tablet 0   No current facility-administered medications on file prior to visit.    Allergies: No Known Allergies Past Medical History:  Past Medical History:  Diagnosis Date   Bulging lumbar disc    Complication of anesthesia    PONV (postoperative nausea and vomiting)    Vomited x1 after craniotomy surgery   Seizure (HCC) 04/03/2020   x 1 at home pre-admission.  Otherwise, no history.   Past Surgical History:  Past Surgical History:  Procedure Laterality Date   BURR HOLE Right 04/20/2020   Procedure: Irrigation and Debridement of Cranial Wound;  Surgeon: Jadene Pierini, MD;  Location: [facility];  Service: Neurosurgery;  Laterality: Right;   CRANIOTOMY Left 04/04/2020   Procedure: LEFT CRANIOTOMY FOR TUMOR EXCISION;  Surgeon: Jadene Pierini, MD;  Location: [facility] OR;  Service: Neurosurgery;  Laterality: Left;   TONSILLECTOMY     WISDOM TOOTH EXTRACTION     WOUND EXPLORATION Left 05/17/2020   Procedure: CRANIAL WOUND EXPLORATION WITH PLACEMENT OF MESH;  Surgeon: Jadene Pierini, MD;  Location: [facility] OR;  Service: Neurosurgery;  Laterality: Left;   Social History:  Social History   Socioeconomic History   Marital status: Single    Spouse name: Not on file   Number of children: 0   Years of education: Not on file   Highest education level: Not on file  Occupational History   Not on file  Tobacco Use   Smoking status: Never   Smokeless tobacco: Never  Vaping Use   Vaping status: Never Used  Substance and Sexual Activity   Alcohol use: No    Alcohol/week: 0.0 standard drinks of alcohol   Drug use: No   Sexual activity: Yes    Birth control/protection: Condom  Other Topics Concern   Not on file  Social History Narrative   Not on file   Social Drivers of Health   Financial Resource Strain: Not on file  Food Insecurity: Low Risk  (02/07/2023)   Received from [facility]   Hunger Vital Sign    Worried About Running Out of Food in the Last Year:  Never true    Ran Out of Food in the Last Year: Never true  Transportation Needs: No Transportation Needs (02/07/2023)   Received from [facility]    In the past 12 months, has lack of reliable transportation kept you from medical appointments, meetings, work or from getting things needed for daily living? : No  Physical Activity: Not on file  Stress: Not on file  Social Connections: Not on file  Intimate Partner Violence: Not on file   Family History:  Family History  Problem Relation Age of Onset   Melanoma Father 61   Melanoma Maternal Grandmother        dx mid 32s   Colon cancer Maternal Grandfather        late 60s   Aplastic anemia Paternal Grandmother    Melanoma Paternal Grandfather 67   Skin cancer Maternal Uncle     Review of Systems: Constitutional: Doesn't report fevers, chills or abnormal weight loss Eyes: Doesn't report  blurriness of vision Ears, nose, mouth, throat, and face: Doesn't report sore throat Respiratory: Doesn't report cough, dyspnea or wheezes Cardiovascular: Doesn't report palpitation, chest discomfort  Gastrointestinal:  Doesn't report nausea, constipation, diarrhea GU: Doesn't report incontinence Skin: Doesn't report skin rashes Neurological: Per HPI Musculoskeletal: Doesn't report joint pain Behavioral/Psych: Doesn't report anxiety  Physical Exam: Vitals:   02/12/23 1215  BP: 112/75  Pulse: 87  Resp: 17  Temp: 97.7 F (36.5 C)  SpO2: 100%    KPS: 90. General: Alert, cooperative, pleasant, in no acute distress Head: Normal EENT: No conjunctival injection or scleral icterus.  Lungs: Resp effort normal Cardiac: Regular rate Abdomen: Non-distended abdomen Skin: No rashes cyanosis or petechiae. Extremities: No clubbing or edema  Neurologic Exam: Mental Status: Awake, alert, attentive to examiner. Oriented to self and environment. Language is fluent with intact comprehension.  Cranial Nerves: Visual acuity is  grossly normal. Visual fields are full. Extra-ocular movements intact. No ptosis. Face is symmetric Motor: Tone and bulk are normal. Power is full in both arms and legs. Reflexes are symmetric, no pathologic reflexes present.  Sensory: Intact to light touch Gait: Normal.   Labs: I have reviewed the data as listed    Component Value Date/Time   NA 138 02/21/2022 0242   K 4.4 02/21/2022 0242   CL 101 02/21/2022 0242   CO2 28 02/21/2022 0242   GLUCOSE 79 02/21/2022 0242   BUN 11 02/21/2022 0242   CREATININE 0.88 02/21/2022 0242   CALCIUM 10.2 02/21/2022 0242   PROT 8.0 02/21/2022 0242   ALBUMIN 4.8 07/05/2021 0925   AST 18 02/21/2022 0242   AST 16 07/05/2021 0925   ALT 22 02/21/2022 0242   ALT 18 07/05/2021 0925   ALKPHOS 46 07/05/2021 0925   BILITOT 0.4 02/21/2022 0242   BILITOT 0.5 07/05/2021 0925   GFRNONAA >60 07/05/2021 0925   GFRAA  08/10/2009 2150    NOT CALCULATED        The eGFR has been calculated using the MDRD equation. This calculation has not been validated in all clinical situations. eGFR's persistently <60 mL/min signify possible Chronic Kidney Disease.   Lab Results  Component Value Date   WBC 7.6 02/21/2022   NEUTROABS 2.8 07/05/2021   HGB 15.0 02/21/2022   HCT 43.6 02/21/2022   MCV 87.0 02/21/2022   PLT 300 02/21/2022   Imaging:  [facility] Clinician Interpretation: I have personally reviewed the CNS images as listed.  My interpretation, in the context of the patient's clinical presentation, is stable disease  MR BRAIN W WO CONTRAST Result Date: 01/22/2023 CLINICAL DATA:  Provided history: Grade IV glioma. Brain/CNS neoplasm, assess treatment response prior EXAM: MRI HEAD WITHOUT AND WITH CONTRAST TECHNIQUE: Multiplanar, multiecho pulse sequences of the brain and surrounding structures were obtained without and with intravenous contrast. CONTRAST:  7mL GADAVIST GADOBUTROL 1 MMOL/ML IV SOLN COMPARISON:  Prior brain MRI examinations 10/20/2022 and  earlier. FINDINGS: Brain: As compared to the prior brain MRI of 10/20/2022, stable appearance of the resection cavity centered in the left superior frontal gyrus. Chronic blood products at this site. No new or nodular enhancement along the resection cavity margins. Surrounding T2 FLAIR hyperintense parenchymal signal abnormality, also unchanged. There is no acute infarct. No extra-axial fluid collection. No midline shift. Vascular: Maintained flow voids within the proximal large arterial vessels. Developmental venous anomaly within the anterior right cerebellar hemisphere (anatomic variant). Skull and upper cervical spine: Left frontoparietal cranioplasty. No focal worrisome marrow lesion. Sinuses/Orbits: No mass or  acute finding within the imaged orbits. Large mucous retention cyst, and mild background mucosal thickening, within the right maxillary sinus. 2 cm mucous retention cyst, and mild background mucosal thickening, within the left maxillary sinus. Mild mucosal thickening within the bilateral ethmoid sinuses. IMPRESSION: 1. Post-treatment changes within the left frontal lobe, stable from the prior brain MRI of 10/20/2022. No evidence of tumor progression. 2. Paranasal sinus disease as described. Electronically Signed   By: Jackey Loge D.O.   On: 01/22/2023 12:30     Assessment/Plan 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HCC)  Seizure (HCC)  Gordon [name] is clinically and radiographically stable today.  No new or progressive changes.  He will continue Keppra 750mg  BID.  Ritalin may con't 10mg  BID as well.  Gordon [name] should return to clinic following [facility] recommendations as needed.  All questions were answered. The patient knows to call the clinic with any problems, questions or concerns. No barriers to learning were detected.  The total time spent in the encounter was 30 minutes and more than 50% was on counseling and review of test results   Henreitta Leber, MD Medical  Director of Neuro-Oncology [facility] 02/12/23 12:05 PM

## 2023-03-12 ENCOUNTER — Other Ambulatory Visit: Payer: Self-pay | Admitting: Internal Medicine

## 2023-04-03 ENCOUNTER — Telehealth: Payer: Self-pay | Admitting: Internal Medicine

## 2023-04-03 ENCOUNTER — Telehealth: Payer: Self-pay | Admitting: *Deleted

## 2023-04-03 NOTE — Telephone Encounter (Signed)
 Patient needs F/U appointment with Dr Barbaraann Cao the week after MRI on 04/25/23.  Scheduling message sent.

## 2023-04-25 ENCOUNTER — Ambulatory Visit (HOSPITAL_COMMUNITY)
Admission: RE | Admit: 2023-04-25 | Discharge: 2023-04-25 | Disposition: A | Discharge status: Home / Self Care | Payer: Medicare Other | Source: Ambulatory Visit | Attending: Internal Medicine | Admitting: Internal Medicine

## 2023-04-25 DIAGNOSIS — C719 Malignant neoplasm of brain, unspecified: Secondary | ICD-10-CM | POA: Insufficient documentation

## 2023-04-25 MED ORDER — GADOBUTROL 1 MMOL/ML IV SOLN
10.0000 mL | Freq: Once | INTRAVENOUS | Status: AC | PRN
  Administered 2023-04-25: 10 mL via INTRAVENOUS

## 2023-04-26 ENCOUNTER — Telehealth: Payer: Self-pay | Admitting: *Deleted

## 2023-04-26 NOTE — Telephone Encounter (Signed)
 Requested that MRI brain images from 04/25/23 be sent to [facility] through [facility].

## 2023-05-01 ENCOUNTER — Ambulatory Visit: Admitting: Internal Medicine

## 2023-05-03 ENCOUNTER — Inpatient Hospital Stay: Attending: Internal Medicine | Admitting: Internal Medicine

## 2023-05-03 ENCOUNTER — Other Ambulatory Visit: Payer: Self-pay

## 2023-05-03 VITALS — BP 119/78 | HR 83 | Temp 97.3°F | Resp 18 | Wt 206.5 lb

## 2023-05-03 DIAGNOSIS — C719 Malignant neoplasm of brain, unspecified: Secondary | ICD-10-CM | POA: Diagnosis not present

## 2023-05-03 DIAGNOSIS — Z923 Personal history of irradiation: Secondary | ICD-10-CM | POA: Diagnosis not present

## 2023-05-03 DIAGNOSIS — Z79899 Other long term (current) drug therapy: Secondary | ICD-10-CM | POA: Insufficient documentation

## 2023-05-03 DIAGNOSIS — C711 Malignant neoplasm of frontal lobe: Secondary | ICD-10-CM | POA: Diagnosis present

## 2023-05-03 DIAGNOSIS — Z9221 Personal history of antineoplastic chemotherapy: Secondary | ICD-10-CM | POA: Diagnosis not present

## 2023-05-03 DIAGNOSIS — R569 Unspecified convulsions: Secondary | ICD-10-CM | POA: Diagnosis not present

## 2023-05-03 NOTE — Progress Notes (Signed)
 [facility] [address]  [place], [place] [address] [phone]   Interval Evaluation  Date of Service: 05/03/23 Patient Name: Gordon [name] Patient MRN: [id number] Patient DOB: [birth year] Provider: Henreitta Leber, MD  Identifying Statement:  Gordon [name] is a 28 y.o. male with left frontal  grade 4 astrocytoma    Oncologic History: Oncology History  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HCC)  04/04/2020 Surgery   Craniotomy, resection of left frontal mass with Dr. Maurice Small; path is grade 4 astrocytoma, IDH-1 mutant   04/20/2020 Surgery   Washout of surgical site collection; speciates MSSA   05/17/2020 Surgery   Repeat wound exploration, irrigation.   06/07/2020 -  Radiation Therapy   IMRT and concurrent Temodar 75mg /m2   08/09/2020 - 08/09/2020 Chemotherapy   Patient is on Treatment Plan : BRAIN GLIOBLASTOMA Consolidation Temozolomide Days 1-5 q28 Days      07/25/2022 Genetic Testing   Negative genetic testing on the Multi-cancer + RNA + Brain tumor panel. KIF1B c.1456C>G (p.Pro486Ala) VUS identified.  The report date is 08/01/2022.  The Multi-Cancer + RNA Panel + Brain tumor panel offered by [facility] includes sequencing and/or deletion/duplication analysis of the following 86 genes:  AIP, ALK, APC*, ATM*, AXIN2, BAP1, BARD1, BLM, BMPR1A, BRCA1, BRCA2, BRIP1, CDC73, CDH1, CDK4, CDKN1B, CDKN1C, CDKN2A (p14ARF), CDKN2A (p16INK4a), CHEK2, CTNNA1, DICER1*, DLST, EGFR, EGLN1*, ELP1, EPAS1*, EPCAM*, EZH2*, FH*, FLCN, GPC3*, GPR161, GREM1*, HOXB13, HRAS, KIF1B*, KIT, LZTR1, MAX*, MBD4, MDH2, MEN1*, MET*, MITF, MLH1*, MSH2*, MSH3*, MSH6*, MUTYH, NBN, NF1*, NF2, NTHL1, PALB2, PDGFRA, PHOX2B*, PMS2*, POLD1*, POLE, POT1, PRKAR1A, PTCH1, PTCH2, PTEN*, RAD51C, RAD51D, RB1*, RET, SDHA*, SDHAF2, SDHB, SDHC*, SDHD, SLC25A11, SMAD4, SMARCA4, SMARCB1, SMARCE1, STK11, SUCLG2, SUFU, TMEM127, TP53, TSC1*, TSC2, and VHL. RNA analysis is performed for * genes.       Biomarkers:  MGMT Unknown.  IDH 1/2 Mutated.  EGFR Unknown  TERT Unknown   Interval History:  Gordon [name] presents to clinic for follow up today following recent MRI brain. No neurologic changes today. Denies headaches, seizures.  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  Has upcoming trip to [place] planned this summer.  H+P (04/12/20) Patient presented to medical attention three weeks prior with episode of loss of awareness and generalized shaking consistent with seizure episode.  CNS imaging demonstrated an enhancing left frontal mass, which was resected on 04/04/20 by Dr. Maurice Small.  He tolerated surgery well, has no significant complaints at this time.  Last day of decadron taper is today.  He has been more or less independent at home, starting to resume some of his studies at school.  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  No other significant medical history.  Medications: Current Outpatient Medications on File Prior to Visit  Medication Sig Dispense Refill   doxycycline (VIBRA-TABS) 100 MG tablet TAKE 1 TABLET(100 MG) BY MOUTH DAILY 90 tablet 0   levETIRAcetam (KEPPRA) 750 MG tablet TAKE 1 TABLET(750 MG) BY MOUTH TWICE DAILY 60 tablet 5   meloxicam (MOBIC) 15 MG tablet Take 1 tablet (15 mg total) by mouth daily as needed for pain. 30 tablet 0   methylphenidate (RITALIN) 10 MG tablet Take 1 tablet by mouth 2 times daily. 60 tablet 0   MOUNJARO 7.5 MG/0.5ML Pen Inject 7.5 mg into the skin once a week.     Multiple Vitamin (MULTIVITAMIN PO) Take 1 capsule by mouth daily.     Omega-3 Fatty Acids (OMEGA 3 PO) Take 1 capsule by mouth  daily.     Probiotic Product (PROBIOTIC PO) Take 1 capsule by mouth daily.     prochlorperazine (COMPAZINE) 10 MG tablet TAKE 1 TABLET(10 MG) BY MOUTH EVERY 6 HOURS AS NEEDED 60 tablet 1   sertraline (ZOLOFT) 25 MG tablet Take by mouth.     tiZANidine (ZANAFLEX) 2 MG tablet Take 1 tablet (2 mg total) by mouth at bedtime. 30 tablet 0    No current facility-administered medications on file prior to visit.    Allergies: No Known Allergies Past Medical History:  Past Medical History:  Diagnosis Date   Bulging lumbar disc    Complication of anesthesia    PONV (postoperative nausea and vomiting)    Vomited x1 after craniotomy surgery   Seizure (HCC) 04/03/2020   x 1 at home pre-admission.  Otherwise, no history.   Past Surgical History:  Past Surgical History:  Procedure Laterality Date   BURR HOLE Right 04/20/2020   Procedure: Irrigation and Debridement of Cranial Wound;  Surgeon: Jadene Pierini, MD;  Location: [facility] OR;  Service: Neurosurgery;  Laterality: Right;   CRANIOTOMY Left 04/04/2020   Procedure: LEFT CRANIOTOMY FOR TUMOR EXCISION;  Surgeon: Jadene Pierini, MD;  Location: [facility] OR;  Service: Neurosurgery;  Laterality: Left;   TONSILLECTOMY     WISDOM TOOTH EXTRACTION     WOUND EXPLORATION Left 05/17/2020   Procedure: CRANIAL WOUND EXPLORATION WITH PLACEMENT OF MESH;  Surgeon: Jadene Pierini, MD;  Location: [facility] OR;  Service: Neurosurgery;  Laterality: Left;   Social History:  Social History   Socioeconomic History   Marital status: Single    Spouse name: Not on file   Number of children: 0   Years of education: Not on file   Highest education level: Not on file  Occupational History   Not on file  Tobacco Use   Smoking status: Never   Smokeless tobacco: Never  Vaping Use   Vaping status: Never Used  Substance and Sexual Activity   Alcohol use: No    Alcohol/week: 0.0 standard drinks of alcohol   Drug use: No   Sexual activity: Yes    Birth control/protection: Condom  Other Topics Concern   Not on file  Social History Narrative   Not on file   Social Drivers of Health   Financial Resource Strain: Not on file  Food Insecurity: Low Risk  (02/07/2023)   Received from [facility]   Hunger Vital Sign    Ran Out of Food in the Last Year: Never true    Worried About Running Out of  Food in the Last Year: Never true  Transportation Needs: No Transportation Needs (02/07/2023)   Received from [facility]    In the past 12 months, has lack of reliable transportation kept you from medical appointments, meetings, work or from getting things needed for daily living? : No  Physical Activity: Not on file  Stress: Not on file  Social Connections: Not on file  Intimate Partner Violence: Not on file   Family History:  Family History  Problem Relation Age of Onset   Melanoma Father 20   Melanoma Maternal Grandmother        dx mid 43s   Colon cancer Maternal Grandfather        late 60s   Aplastic anemia Paternal Grandmother    Melanoma Paternal Grandfather 34   Skin cancer Maternal Uncle     Review of Systems: Constitutional: Doesn't report fevers, chills or abnormal  weight loss Eyes: Doesn't report blurriness of vision Ears, nose, mouth, throat, and face: Doesn't report sore throat Respiratory: Doesn't report cough, dyspnea or wheezes Cardiovascular: Doesn't report palpitation, chest discomfort  Gastrointestinal:  Doesn't report nausea, constipation, diarrhea GU: Doesn't report incontinence Skin: Doesn't report skin rashes Neurological: Per HPI Musculoskeletal: Doesn't report joint pain Behavioral/Psych: Doesn't report anxiety  Physical Exam: Vitals:   05/03/23 0930  BP: 119/78  Pulse: 83  Resp: 18  Temp: (!) 97.3 F (36.3 C)  SpO2: 93%    KPS: 90. General: Alert, cooperative, pleasant, in no acute distress Head: Normal EENT: No conjunctival injection or scleral icterus.  Lungs: Resp effort normal Cardiac: Regular rate Abdomen: Non-distended abdomen Skin: No rashes cyanosis or petechiae. Extremities: No clubbing or edema  Neurologic Exam: Mental Status: Awake, alert, attentive to examiner. Oriented to self and environment. Language is fluent with intact comprehension.  Cranial Nerves: Visual acuity is grossly normal. Visual fields  are full. Extra-ocular movements intact. No ptosis. Face is symmetric Motor: Tone and bulk are normal. Power is full in both arms and legs. Reflexes are symmetric, no pathologic reflexes present.  Sensory: Intact to light touch Gait: Normal.   Labs: I have reviewed the data as listed    Component Value Date/Time   NA 138 02/21/2022 0242   K 4.4 02/21/2022 0242   CL 101 02/21/2022 0242   CO2 28 02/21/2022 0242   GLUCOSE 79 02/21/2022 0242   BUN 11 02/21/2022 0242   CREATININE 0.88 02/21/2022 0242   CALCIUM 10.2 02/21/2022 0242   PROT 8.0 02/21/2022 0242   ALBUMIN 4.8 07/05/2021 0925   AST 18 02/21/2022 0242   AST 16 07/05/2021 0925   ALT 22 02/21/2022 0242   ALT 18 07/05/2021 0925   ALKPHOS 46 07/05/2021 0925   BILITOT 0.4 02/21/2022 0242   BILITOT 0.5 07/05/2021 0925   GFRNONAA >60 07/05/2021 0925   GFRAA  08/10/2009 2150    NOT CALCULATED        The eGFR has been calculated using the MDRD equation. This calculation has not been validated in all clinical situations. eGFR's persistently <60 mL/min signify possible Chronic Kidney Disease.   Lab Results  Component Value Date   WBC 7.6 02/21/2022   NEUTROABS 2.8 07/05/2021   HGB 15.0 02/21/2022   HCT 43.6 02/21/2022   MCV 87.0 02/21/2022   PLT 300 02/21/2022   Imaging:  [facility] Clinician Interpretation: I have personally reviewed the CNS images as listed.  My interpretation, in the context of the patient's clinical presentation, is stable disease  MR BRAIN W WO CONTRAST Result Date: 04/25/2023 CLINICAL DATA:  Provided history: Grade IV glioma. Brain/CNS neoplasm, assess treatment response. EXAM: MRI HEAD WITHOUT AND WITH CONTRAST TECHNIQUE: Multiplanar, multiecho pulse sequences of the brain and surrounding structures were obtained without and with intravenous contrast. CONTRAST:  10mL GADAVIST GADOBUTROL 1 MMOL/ML IV SOLN COMPARISON:  Prior brain MRI examinations 01/22/2023 and earlier. FINDINGS: Brain: Stable appearance  of the resection cavity centered within the left superior frontal gyrus. Chronic blood products at the resection site. Unchanged T2 FLAIR hyperintense signal abnormality surrounding the resection cavity. No new or nodular enhancement along the resection cavity margins. There is no acute infarct. No extra-axial fluid collection. No midline shift. Vascular: Maintained flow voids within the proximal large arterial vessels. Skull and upper cervical spine: Left frontoparietal cranioplasty. No focal worrisome marrow lesion. Developmental venous anomaly within the right cerebellar hemisphere (anatomic variant). Sinuses/Orbits: No mass or acute finding within  the imaged orbits. Large mucous retention cyst, and mild background mucosal thickening, within the right maxillary sinus. 2 cm mucous retention cyst, and minimal background mucosal thickening, within the left maxillary sinus. Minimal mucosal thickening within a left ethmoid air cell. IMPRESSION: 1. Post-treatment changes within the left frontal lobe, stable from the prior brain MRI of 01/22/2023. No evidence of tumor progression. 2. Paranasal sinus disease as described. Electronically Signed   By: Jackey Loge D.O.   On: 04/25/2023 11:52     Assessment/Plan 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HCC)  Seizure (HCC)  Gordon [name] is clinically and radiographically stable today.  No new or progressive changes.  MRI brain demonstrates small focus of T2 signal posterior to resection cavity which is very modestly increased when compared to 2 years prior; etiology could be post-RT effects.  He will continue Keppra 750mg  BID.  Ritalin may con't 10mg  BID as well.  We ask that Gordon [name] return to clinic in 4 months following next brain MRI, or sooner as needed.  All questions were answered. The patient knows to call the clinic with any problems, questions or concerns. No barriers to learning were detected.  The total time spent in the encounter was 40 minutes  and more than 50% was on counseling and review of test results   Henreitta Leber, MD Medical Director of Neuro-Oncology [facility] 05/03/23 9:41 AM

## 2023-05-04 ENCOUNTER — Telehealth: Payer: Self-pay | Admitting: Internal Medicine

## 2023-05-04 NOTE — Telephone Encounter (Signed)
 Linda scheduled Dickey's appointments.

## 2023-09-06 ENCOUNTER — Ambulatory Visit (HOSPITAL_COMMUNITY)
Admission: RE | Admit: 2023-09-06 | Discharge: 2023-09-06 | Disposition: A | Discharge status: Home / Self Care | Source: Ambulatory Visit | Attending: Internal Medicine | Admitting: Internal Medicine

## 2023-09-06 DIAGNOSIS — C719 Malignant neoplasm of brain, unspecified: Secondary | ICD-10-CM | POA: Diagnosis present

## 2023-09-06 MED ORDER — GADOBUTROL 1 MMOL/ML IV SOLN
9.0000 mL | Freq: Once | INTRAVENOUS | Status: AC | PRN
  Administered 2023-09-06: 9 mL via INTRAVENOUS

## 2023-09-10 ENCOUNTER — Encounter: Payer: Self-pay | Admitting: *Deleted

## 2023-09-10 NOTE — Progress Notes (Signed)
 Requested that images from MRI brain on 09/06/23 be pushed to [facility] through [facility].

## 2023-09-13 ENCOUNTER — Inpatient Hospital Stay: Attending: Internal Medicine | Admitting: Internal Medicine

## 2023-09-13 ENCOUNTER — Telehealth: Payer: Self-pay | Admitting: Internal Medicine

## 2023-09-13 VITALS — BP 133/86 | HR 76 | Temp 97.9°F | Resp 20 | Wt 203.9 lb

## 2023-09-13 DIAGNOSIS — C719 Malignant neoplasm of brain, unspecified: Secondary | ICD-10-CM | POA: Insufficient documentation

## 2023-09-13 DIAGNOSIS — Z79899 Other long term (current) drug therapy: Secondary | ICD-10-CM | POA: Insufficient documentation

## 2023-09-13 DIAGNOSIS — R569 Unspecified convulsions: Secondary | ICD-10-CM | POA: Insufficient documentation

## 2023-09-13 MED ORDER — LEVETIRACETAM 500 MG PO TABS: 500.0000 mg | ORAL_TABLET | Freq: Two times a day (BID) | ORAL | 3 refills | Status: DC

## 2023-09-13 NOTE — Telephone Encounter (Signed)
 Scheduled appointment per 8/21 los. Talked with the patient and he is aware of the made appointment.

## 2023-09-13 NOTE — Progress Notes (Signed)
 [facility] at [facility] [address]  [place], [place] [address] [phone]   Interval Evaluation  Date of Service: 09/13/23 Patient Name: Gordon [name] Patient MRN: [id number] Patient DOB: 07-14-1995 Provider: Arthea MARLA Manns, MD  Identifying Statement:  Gordon [name] is a 28 y.o. male with left frontal grade 4 astrocytoma   Oncologic History: Oncology History  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HCC)  04/04/2020 Surgery   Craniotomy, resection of left frontal [name] with Dr. Cheryle; path is grade 4 astrocytoma, IDH-1 mutant   04/20/2020 Surgery   Washout of surgical site collection; speciates MSSA   05/17/2020 Surgery   Repeat wound exploration, irrigation.   06/07/2020 -  Radiation Therapy   IMRT and concurrent Temodar  75mg /m2   08/09/2020 - 08/09/2020 Chemotherapy   Patient is on Treatment Plan : BRAIN GLIOBLASTOMA Consolidation Temozolomide  Days 1-5 q28 Days      07/25/2022 Genetic Testing   Negative genetic testing on the Multi-cancer + RNA + Brain tumor panel. KIF1B c.1456C>G (p.Pro486Ala) VUS identified.  The report date is 08/01/2022.  The Multi-Cancer + RNA Panel + Brain tumor panel offered by [facility] includes sequencing and/or deletion/duplication analysis of the following 86 genes:  AIP, ALK, APC*, ATM*, AXIN2, BAP1, BARD1, BLM, BMPR1A, BRCA1, BRCA2, BRIP1, CDC73, CDH1, CDK4, CDKN1B, CDKN1C, CDKN2A (p14ARF), CDKN2A (p16INK4a), CHEK2, CTNNA1, DICER1*, DLST, EGFR, EGLN1*, ELP1, EPAS1*, EPCAM*, EZH2*, FH*, FLCN, GPC3*, GPR161, GREM1*, HOXB13, HRAS, KIF1B*, KIT, LZTR1, MAX*, MBD4, MDH2, MEN1*, MET*, MITF, MLH1*, MSH2*, MSH3*, MSH6*, MUTYH, NBN, NF1*, NF2, NTHL1, PALB2, PDGFRA, PHOX2B*, PMS2*, POLD1*, POLE, POT1, PRKAR1A, PTCH1, PTCH2, PTEN*, RAD51C, RAD51D, RB1*, RET, SDHA*, SDHAF2, SDHB, SDHC*, SDHD, SLC25A11, SMAD4, SMARCA4, SMARCB1, SMARCE1, STK11, SUCLG2, SUFU, TMEM127, TP53, TSC1*, TSC2, and VHL. RNA analysis is performed for * genes.       Biomarkers:  MGMT Unknown.  IDH 1/2 Mutated.  EGFR Unknown  TERT Unknown   Interval History:  Gordon [name] presents to clinic for follow up today following recent MRI brain. No neurologic changes today. Denies headaches, seizures.  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  Enjoyed his time in [place] this summer, he is now engaged.  Gordon (04/12/20) Patient presented to medical attention three weeks prior with episode of loss of awareness and generalized shaking consistent with seizure episode.  CNS imaging demonstrated an enhancing left frontal [name], Gordon was resected on 04/04/20 by Dr. Cheryle.  He tolerated surgery [name], Gordon no significant complaints at this time.  Last day of decadron  [name] is today.  He Gordon been more or less independent at home, starting to resume some of his studies at school.  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  No other significant medical history.  Medications: Current Outpatient Medications on File Prior to Visit  Medication Sig Dispense Refill   doxycycline  (VIBRA -TABS) 100 MG tablet TAKE 1 TABLET(100 MG) BY MOUTH DAILY (Patient taking differently: Take 100 mg by mouth. 5 times a week) 90 tablet 0   levETIRAcetam  (KEPPRA ) 750 MG tablet TAKE 1 TABLET(750 MG) BY MOUTH TWICE DAILY 60 tablet 5   meloxicam  (MOBIC ) 15 MG tablet Take 1 tablet (15 mg total) by mouth daily as needed for pain. 30 tablet 0   methylphenidate  (RITALIN ) 10 MG tablet Take 1 tablet by mouth 2 times daily. (Patient taking differently: Take 10 mg by mouth daily as needed.) 60 tablet 0   methylphenidate  36 MG PO CR tablet Take 36 mg by mouth daily.     MOUNJARO 7.5  MG/0.5ML Pen Inject 7.5 mg into the skin once a week.     prochlorperazine  (COMPAZINE ) 10 MG tablet TAKE 1 TABLET(10 MG) BY MOUTH EVERY 6 HOURS AS NEEDED (Patient taking differently: Take 10 mg by mouth every 6 (six) hours as needed. TAKE 1 TABLET(10 MG) BY MOUTH EVERY 6 HOURS AS NEEDED) 60 tablet 1    sertraline (ZOLOFT) 25 MG tablet Take by mouth.     tiZANidine  (ZANAFLEX ) 2 MG tablet Take 1 tablet (2 mg total) by mouth at bedtime. (Patient taking differently: Take 2 mg by mouth as needed.) 30 tablet 0   No current facility-administered medications on file prior to visit.    Allergies: No Known Allergies Past Medical History:  Past Medical History:  Diagnosis Date   Bulging lumbar disc    Complication of anesthesia    PONV (postoperative nausea and vomiting)    Vomited x1 after craniotomy surgery   Seizure (HCC) 04/03/2020   x 1 at home pre-admission.  Otherwise, no history.   Past Surgical History:  Past Surgical History:  Procedure Laterality Date   BURR HOLE Right 04/20/2020   Procedure: Irrigation and Debridement of Cranial Wound;  Surgeon: Cheryle Debby LABOR, MD;  Location: [facility] OR;  Service: Neurosurgery;  Laterality: Right;   CRANIOTOMY Left 04/04/2020   Procedure: LEFT CRANIOTOMY FOR TUMOR EXCISION;  Surgeon: Cheryle Debby LABOR, MD;  Location: [facility] OR;  Service: Neurosurgery;  Laterality: Left;   TONSILLECTOMY     WISDOM TOOTH EXTRACTION     WOUND EXPLORATION Left 05/17/2020   Procedure: CRANIAL WOUND EXPLORATION WITH PLACEMENT OF MESH;  Surgeon: Cheryle Debby LABOR, MD;  Location: [facility] OR;  Service: Neurosurgery;  Laterality: Left;   Social History:  Social History   Socioeconomic History   Marital status: Single    Spouse name: Not on file   Number of children: 0   Years of education: Not on file   Highest education level: Not on file  Occupational History   Not on file  Tobacco Use   Smoking status: Never   Smokeless tobacco: Never  Vaping Use   Vaping status: Never Used  Substance and Sexual Activity   Alcohol use: No    Alcohol/week: 0.0 standard drinks of alcohol   Drug use: No   Sexual activity: Yes    Birth control/protection: Condom  Other Topics Concern   Not on file  Social History Narrative   Not on file   Social Drivers of Health   Financial  Resource Strain: Not on file  Food Insecurity: Low Risk  (02/07/2023)   Received from [facility]   Hunger Vital Sign    Within the past 12 months, the food you bought just didn't last and you didn't have money to get more. : Never true    Within the past 12 months, you worried that your food would run out before you got money to buy more: Never true  Transportation Needs: No Transportation Needs (02/07/2023)   Received from [facility]    In the past 12 months, Gordon lack of reliable transportation kept you from medical appointments, meetings, work or from getting things needed for daily living? : No  Physical Activity: Not on file  Stress: Not on file  Social Connections: Not on file  Intimate Partner Violence: Not on file   Family History:  Family History  Problem Relation Age of Onset   Melanoma Father 24   Melanoma Maternal Grandmother  dx mid 91s   Colon cancer Maternal Grandfather        late 60s   Aplastic anemia Paternal Grandmother    Melanoma Paternal Grandfather 2   Skin cancer Maternal Uncle     Review of Systems: Constitutional: Doesn't report fevers, chills or abnormal weight loss Eyes: Doesn't report blurriness of vision Ears, nose, mouth, throat, and face: Doesn't report sore throat Respiratory: Doesn't report cough, dyspnea or wheezes Cardiovascular: Doesn't report palpitation, chest discomfort  Gastrointestinal:  Doesn't report nausea, constipation, diarrhea GU: Doesn't report incontinence Skin: Doesn't report skin rashes Neurological: Per HPI Musculoskeletal: Doesn't report joint pain Behavioral/Psych: Doesn't report anxiety  Physical Exam: Vitals:   09/13/23 0929  BP: 133/86  Pulse: 76  Resp: 20  Temp: 97.9 F (36.6 C)  SpO2: 100%    KPS: 90. General: Alert, cooperative, pleasant, in no acute distress Head: Normal EENT: No conjunctival injection or scleral icterus.  Lungs: Resp effort normal Cardiac: Regular  rate Abdomen: Non-distended abdomen Skin: No rashes cyanosis or petechiae. Extremities: No clubbing or edema  Neurologic Exam: Mental Status: Awake, alert, attentive to examiner. Oriented to self and environment. Language is fluent with intact comprehension.  Cranial Nerves: Visual acuity is grossly normal. Visual fields are full. Extra-ocular movements intact. No ptosis. Face is symmetric Motor: Tone and bulk are normal. Power is full in both arms and legs. Reflexes are symmetric, no pathologic reflexes present.  Sensory: Intact to light touch Gait: Normal.   Labs: I have reviewed the data as listed    Component Value Date/Time   NA 138 02/21/2022 0242   K 4.4 02/21/2022 0242   CL 101 02/21/2022 0242   CO2 28 02/21/2022 0242   GLUCOSE 79 02/21/2022 0242   BUN 11 02/21/2022 0242   CREATININE 0.88 02/21/2022 0242   CALCIUM 10.2 02/21/2022 0242   PROT 8.0 02/21/2022 0242   ALBUMIN  4.8 07/05/2021 0925   AST 18 02/21/2022 0242   AST 16 07/05/2021 0925   ALT 22 02/21/2022 0242   ALT 18 07/05/2021 0925   ALKPHOS 46 07/05/2021 0925   BILITOT 0.4 02/21/2022 0242   BILITOT 0.5 07/05/2021 0925   GFRNONAA >60 07/05/2021 0925   GFRAA  08/10/2009 2150    NOT CALCULATED        The eGFR Gordon been calculated using the MDRD equation. This calculation Gordon not been validated in all clinical situations. eGFR's persistently <60 mL/min signify possible Chronic Kidney Disease.   Lab Results  Component Value Date   WBC 7.6 02/21/2022   NEUTROABS 2.8 07/05/2021   HGB 15.0 02/21/2022   HCT 43.6 02/21/2022   MCV 87.0 02/21/2022   PLT 300 02/21/2022   Imaging:  [facility] Clinician Interpretation: I have personally reviewed the CNS images as listed.  My interpretation, in the context of the patient's clinical presentation, is stable disease  MR BRAIN W WO CONTRAST Result Date: 09/06/2023 CLINICAL DATA:  28 year old male with grade 4 astrocytoma, IDH-1 mutant. Status post craniotomy and  resection in 2022. IMRT and Temodar . Restaging. EXAM: MRI HEAD WITHOUT AND WITH CONTRAST TECHNIQUE: Multiplanar, multiecho pulse sequences of the brain and surrounding structures were obtained without and with intravenous contrast. CONTRAST:  9mL GADAVIST  GADOBUTROL  1 MMOL/ML IV SOLN COMPARISON:  Brain MRI 04/25/2023 and earlier. FINDINGS: Brain: Chronic left anterior superior frontal gyrus resection cavity, stable in size and configuration since September 2024. Mild regional T2 and FLAIR hyperintensity also stable since that time, associated hemosiderin stable on SWI. Since 10/20/2022 no  regional [name] effect. Stable ex vacuo enlargement of the underlying left lateral ventricle. No suspicious diffusion changes. And no suspicious postcontrast enhancement. Stable curvilinear probable vascular related enhancement such as series 17, image 25. Stable underlying brain volume. No superimposed restricted diffusion to suggest acute infarction. No midline shift, ventriculomegaly, or acute intracranial hemorrhage. Cervicomedullary junction and pituitary are within normal limits. No new signal abnormality identified. Vascular: Major intracranial vascular flow voids are stable. Following contrast major dural venous sinuses are enhancing and appear to be patent. Skull and upper cervical spine: Stable craniotomy. Normal background bone marrow signal, negative visible cervical spine. Sinuses/Orbits: Improved paranasal sinus aeration since last year. 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 Negative orbits. Other: Mastoids are clear. Visible internal auditory structures appear normal. IMPRESSION: Stable since September 2024 and satisfactory post treatment appearance of the brain. Electronically Signed   By: VEAR Hurst M.D.   On: 09/06/2023 11:45     Assessment/Plan 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HCC)  Seizure (HCC)  Orlie Cundari Custis is clinically and radiographically stable today.  No notable clinical changes.   MRI brain demonstrates stable findings overall.  Keppra  may decrease to 500mg  BID.  Ritalin  may con't 10mg  BID as [name].  We ask that Dijon Cosens Beller return to clinic in 4 months following next brain MRI, or sooner as needed.  All questions were answered. The patient knows to call the clinic with any problems, questions or concerns. No barriers to learning were detected.  The total time spent in the encounter was 40 minutes and more than 50% was on counseling and review of test results   Arthea MARLA Manns, MD Medical Director of Neuro-Oncology [facility] 09/13/23 9:46 AM

## 2023-09-21 ENCOUNTER — Encounter: Payer: Self-pay | Admitting: Internal Medicine

## 2023-09-21 ENCOUNTER — Other Ambulatory Visit: Payer: Self-pay | Admitting: Internal Medicine

## 2023-09-26 NOTE — Telephone Encounter (Signed)
 duplicate

## 2024-01-03 ENCOUNTER — Ambulatory Visit (HOSPITAL_COMMUNITY)
Admission: RE | Admit: 2024-01-03 | Discharge: 2024-01-03 | Disposition: A | Discharge status: Home / Self Care | Source: Ambulatory Visit | Attending: Internal Medicine | Admitting: Internal Medicine

## 2024-01-03 ENCOUNTER — Encounter: Payer: Self-pay | Admitting: *Deleted

## 2024-01-03 DIAGNOSIS — C711 Malignant neoplasm of frontal lobe: Secondary | ICD-10-CM | POA: Diagnosis not present

## 2024-01-03 DIAGNOSIS — C719 Malignant neoplasm of brain, unspecified: Secondary | ICD-10-CM | POA: Insufficient documentation

## 2024-01-03 MED ORDER — GADOBUTROL 1 MMOL/ML IV SOLN
9.0000 mL | Freq: Once | INTRAVENOUS | Status: AC | PRN
  Administered 2024-01-03: 9 mL via INTRAVENOUS

## 2024-01-03 NOTE — Progress Notes (Signed)
 Requested that images from MRI brain on 01/03/24 be pushed to [facility] through [employer].  Email to [employer].

## 2024-01-09 ENCOUNTER — Other Ambulatory Visit: Payer: Self-pay | Admitting: Internal Medicine

## 2024-01-10 ENCOUNTER — Inpatient Hospital Stay: Attending: Internal Medicine | Admitting: Internal Medicine

## 2024-01-10 VITALS — BP 122/75 | HR 71 | Temp 97.7°F | Resp 20 | Wt 222.7 lb

## 2024-01-10 DIAGNOSIS — R569 Unspecified convulsions: Secondary | ICD-10-CM

## 2024-01-10 DIAGNOSIS — Z79899 Other long term (current) drug therapy: Secondary | ICD-10-CM | POA: Insufficient documentation

## 2024-01-10 DIAGNOSIS — C711 Malignant neoplasm of frontal lobe: Secondary | ICD-10-CM | POA: Insufficient documentation

## 2024-01-10 DIAGNOSIS — Z808 Family history of malignant neoplasm of other organs or systems: Secondary | ICD-10-CM | POA: Diagnosis not present

## 2024-01-10 DIAGNOSIS — C719 Malignant neoplasm of brain, unspecified: Secondary | ICD-10-CM

## 2024-01-10 DIAGNOSIS — Z800 Family history of malignant neoplasm of digestive organs: Secondary | ICD-10-CM | POA: Insufficient documentation

## 2024-01-10 NOTE — Progress Notes (Signed)
 [facility] at [facility] [address]  [place], [place] [address] [phone]   Interval Evaluation  Date of Service: 01/10/2024 Patient Name: Gordon [name] Patient MRN: [id number] Patient DOB: 09/01/1995 Provider: Arthea MARLA Manns, MD  Identifying Statement:  Gordon [name] is a 28 y.o. male with left frontal grade 4 astrocytoma   Oncologic History: Oncology History  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HCC)  04/04/2020 Surgery   Craniotomy, resection of left frontal mass with Dr. Cheryle; path is grade 4 astrocytoma, IDH-1 mutant   04/20/2020 Surgery   Washout of surgical site collection; speciates MSSA   05/17/2020 Surgery   Repeat wound exploration, irrigation.   06/07/2020 -  Radiation Therapy   IMRT and concurrent Temodar  75mg /m2   08/09/2020 - 08/09/2020 Chemotherapy   Patient is on Treatment Plan : BRAIN GLIOBLASTOMA Consolidation Temozolomide  Days 1-5 q28 Days      07/25/2022 Genetic Testing   Negative genetic testing on the Multi-cancer + RNA + Brain tumor panel. KIF1B c.1456C>G (p.Pro486Ala) VUS identified.  The report date is 08/01/2022.  The Multi-Cancer + RNA Panel + Brain tumor panel offered by [facility] includes sequencing and/or deletion/duplication analysis of the following 86 genes:  AIP, ALK, APC*, ATM*, AXIN2, BAP1, BARD1, BLM, BMPR1A, BRCA1, BRCA2, BRIP1, CDC73, CDH1, CDK4, CDKN1B, CDKN1C, CDKN2A (p14ARF), CDKN2A (p16INK4a), CHEK2, CTNNA1, DICER1*, DLST, EGFR, EGLN1*, ELP1, EPAS1*, EPCAM*, EZH2*, FH*, FLCN, GPC3*, GPR161, GREM1*, HOXB13, HRAS, KIF1B*, KIT, LZTR1, MAX*, MBD4, MDH2, MEN1*, MET*, MITF, MLH1*, MSH2*, MSH3*, MSH6*, MUTYH, NBN, NF1*, NF2, NTHL1, PALB2, PDGFRA, PHOX2B*, PMS2*, POLD1*, POLE, POT1, PRKAR1A, PTCH1, PTCH2, PTEN*, RAD51C, RAD51D, RB1*, RET, SDHA*, SDHAF2, SDHB, SDHC*, SDHD, SLC25A11, SMAD4, SMARCA4, SMARCB1, SMARCE1, STK11, SUCLG2, SUFU, TMEM127, TP53, TSC1*, TSC2, and VHL. RNA analysis is performed for * genes.       Biomarkers:  MGMT Unknown.  IDH 1/2 Mutated.  EGFR Unknown  TERT Unknown   Interval History:  Gordon [name] presents to clinic for follow up today following recent MRI brain. Denies new or progressive changes. Denies headaches, seizures.  Continues to work chief strategy officer.  Engaged to his fiance who is completing schooling.  H+P (04/12/20) Patient presented to medical attention three weeks prior with episode of loss of awareness and generalized shaking consistent with seizure episode.  CNS imaging demonstrated an enhancing left frontal mass, which was resected on 04/04/20 by Dr. Cheryle.  He tolerated surgery well, has no significant complaints at this time.  Last day of decadron  taper is today.  He has been more or less independent at home, starting to resume some of his studies at school.  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  No other significant medical history.  Medications: Current Outpatient Medications on File Prior to Visit  Medication Sig Dispense Refill   doxycycline  (VIBRA -TABS) 100 MG tablet TAKE 1 TABLET(100 MG) BY MOUTH DAILY (Patient taking differently: Take 100 mg by mouth. 5 times a week) 90 tablet 0   levETIRAcetam  (KEPPRA ) 500 MG tablet Take 1 tablet (500 mg total) by mouth 2 (two) times daily. 60 tablet 3   meloxicam  (MOBIC ) 15 MG tablet Take 1 tablet (15 mg total) by mouth daily as needed for pain. 30 tablet 0   methylphenidate  (RITALIN ) 10 MG tablet Take 1 tablet by mouth 2 times daily. (Patient taking differently: Take 10 mg by mouth daily as needed.) 60 tablet 0   methylphenidate  36 MG PO CR tablet Take 36 mg by mouth daily.  MOUNJARO 7.5 MG/0.5ML Pen Inject 7.5 mg into the skin once a week.     prochlorperazine  (COMPAZINE ) 10 MG tablet TAKE 1 TABLET(10 MG) BY MOUTH EVERY 6 HOURS AS NEEDED (Patient taking differently: Take 10 mg by mouth every 6 (six) hours as needed. TAKE 1 TABLET(10 MG) BY MOUTH EVERY 6 HOURS AS NEEDED) 60 tablet  1   sertraline (ZOLOFT) 25 MG tablet Take by mouth.     tiZANidine  (ZANAFLEX ) 2 MG tablet Take 1 tablet (2 mg total) by mouth at bedtime. (Patient taking differently: Take 2 mg by mouth as needed.) 30 tablet 0   No current facility-administered medications on file prior to visit.    Allergies: No Known Allergies Past Medical History:  Past Medical History:  Diagnosis Date   Bulging lumbar disc    Complication of anesthesia    PONV (postoperative nausea and vomiting)    Vomited x1 after craniotomy surgery   Seizure (HCC) 04/03/2020   x 1 at home pre-admission.  Otherwise, no history.   Past Surgical History:  Past Surgical History:  Procedure Laterality Date   BURR HOLE Right 04/20/2020   Procedure: Irrigation and Debridement of Cranial Wound;  Surgeon: Cheryle Debby LABOR, MD;  Location: [facility] OR;  Service: Neurosurgery;  Laterality: Right;   CRANIOTOMY Left 04/04/2020   Procedure: LEFT CRANIOTOMY FOR TUMOR EXCISION;  Surgeon: Cheryle Debby LABOR, MD;  Location: [facility] OR;  Service: Neurosurgery;  Laterality: Left;   TONSILLECTOMY     WISDOM TOOTH EXTRACTION     WOUND EXPLORATION Left 05/17/2020   Procedure: CRANIAL WOUND EXPLORATION WITH PLACEMENT OF MESH;  Surgeon: Cheryle Debby LABOR, MD;  Location: [facility] OR;  Service: Neurosurgery;  Laterality: Left;   Social History:  Social History   Socioeconomic History   Marital status: Single    Spouse name: Not on file   Number of children: 0   Years of education: Not on file   Highest education level: Not on file  Occupational History   Not on file  Tobacco Use   Smoking status: Never   Smokeless tobacco: Never  Vaping Use   Vaping status: Never Used  Substance and Sexual Activity   Alcohol use: No    Alcohol/week: 0.0 standard drinks of alcohol   Drug use: No   Sexual activity: Yes    Birth control/protection: Condom  Other Topics Concern   Not on file  Social History Narrative   Not on file   Social Drivers of Health    Tobacco Use: Low Risk  (01/08/2024)   Received from [facility]   Patient History    Smoking Tobacco Use: Never    Smokeless Tobacco Use: Never    Passive Exposure: Not on file  Financial Resource Strain: Not on file  Food Insecurity: Low Risk (02/07/2023)   Received from [facility]   Epic    Within the past 12 months, the food you bought just didn't last and you didn't have money to get more. : Never true    Within the past 12 months, you worried that your food would run out before you got money to buy more: Never true  Transportation Needs: No Transportation Needs (02/07/2023)   Received from [facility]    In the past 12 months, has lack of reliable transportation kept you from medical appointments, meetings, work or from getting things needed for daily living? : No  Physical Activity: Not on file  Stress: Not on file  Social Connections: Not on file  Intimate Partner Violence: Not on file  Depression [phone]): Low Risk (09/13/2023)   Depression (PHQ2-9)    PHQ-2 Score: 0  Alcohol Screen: Not on file  Housing: Low Risk (02/07/2023)   Received from [facility]   Epic    What is your living situation today?: I have a steady place to live    Think about the place you live. Do you have problems with any of the following? Choose all that apply:: None/None on this list  Utilities: Low Risk (02/07/2023)   Received from [facility]   Utilities    In the past 12 months has the electric, gas, oil, or water company threatened to shut off services in your home? : No  Health Literacy: Not on file   Family History:  Family History  Problem Relation Age of Onset   Melanoma Father 76   Melanoma Maternal Grandmother        dx mid 75s   Colon cancer Maternal Grandfather        late 60s   Aplastic anemia Paternal Grandmother    Melanoma Paternal Grandfather 49   Skin cancer Maternal Uncle     Review of Systems: Constitutional: Doesn't report  fevers, chills or abnormal weight loss Eyes: Doesn't report blurriness of vision Ears, nose, mouth, throat, and face: Doesn't report sore throat Respiratory: Doesn't report cough, dyspnea or wheezes Cardiovascular: Doesn't report palpitation, chest discomfort  Gastrointestinal:  Doesn't report nausea, constipation, diarrhea GU: Doesn't report incontinence Skin: Doesn't report skin rashes Neurological: Per HPI Musculoskeletal: Doesn't report joint pain Behavioral/Psych: Doesn't report anxiety  Physical Exam: Vitals:   01/10/24 0930  BP: 122/75  Pulse: 71  Resp: 20  Temp: 97.7 F (36.5 C)  SpO2: 100%    KPS: 90. General: Alert, cooperative, pleasant, in no acute distress Head: Normal EENT: No conjunctival injection or scleral icterus.  Lungs: Resp effort normal Cardiac: Regular rate Abdomen: Non-distended abdomen Skin: No rashes cyanosis or petechiae. Extremities: No clubbing or edema  Neurologic Exam: Mental Status: Awake, alert, attentive to examiner. Oriented to self and environment. Language is fluent with intact comprehension.  Cranial Nerves: Visual acuity is grossly normal. Visual fields are full. Extra-ocular movements intact. No ptosis. Face is symmetric Motor: Tone and bulk are normal. Power is full in both arms and legs. Reflexes are symmetric, no pathologic reflexes present.  Sensory: Intact to light touch Gait: Normal.   Labs: I have reviewed the data as listed    Component Value Date/Time   NA 138 02/21/2022 0242   K 4.4 02/21/2022 0242   CL 101 02/21/2022 0242   CO2 28 02/21/2022 0242   GLUCOSE 79 02/21/2022 0242   BUN 11 02/21/2022 0242   CREATININE 0.88 02/21/2022 0242   CALCIUM 10.2 02/21/2022 0242   PROT 8.0 02/21/2022 0242   ALBUMIN  4.8 07/05/2021 0925   AST 18 02/21/2022 0242   AST 16 07/05/2021 0925   ALT 22 02/21/2022 0242   ALT 18 07/05/2021 0925   ALKPHOS 46 07/05/2021 0925   BILITOT 0.4 02/21/2022 0242   BILITOT 0.5 07/05/2021 0925    GFRNONAA >60 07/05/2021 0925   GFRAA  08/10/2009 2150    NOT CALCULATED        The eGFR has been calculated using the MDRD equation. This calculation has not been validated in all clinical situations. eGFR's persistently <60 mL/min signify possible Chronic Kidney Disease.   Lab Results  Component Value Date   WBC  7.6 02/21/2022   NEUTROABS 2.8 07/05/2021   HGB 15.0 02/21/2022   HCT 43.6 02/21/2022   MCV 87.0 02/21/2022   PLT 300 02/21/2022   Imaging:  [facility] Clinician Interpretation: I have personally reviewed the CNS images as listed.  My interpretation, in the context of the patient's clinical presentation, is stable disease  MR BRAIN W WO CONTRAST Result Date: 01/03/2024 CLINICAL DATA:  Follow-up glioma EXAM: MRI HEAD WITHOUT AND WITH CONTRAST TECHNIQUE: Multiplanar, multiecho pulse sequences of the brain and surrounding structures were obtained without and with intravenous contrast. CONTRAST:  9mL GADAVIST  GADOBUTROL  1 MMOL/ML IV SOLN COMPARISON:  September 06, 2023 FINDINGS: MRI brain: Previous left frontal craniotomy with underlying left medial frontal convexity resection cavity. There is a small amount of surrounding T2 hyperintensity without enhancement. Signal in the brain parenchyma elsewhere is normal. No abnormal enhancement. There is no acute or chronic infarct. The ventricles are normal. No mass lesion. There are normal flow signals in the carotid arteries and basilar artery. No significant bone marrow signal abnormality. No significant abnormality in the paranasal sinuses or soft tissues. IMPRESSION: Postoperative changes from resection of left medial frontal tumor. No change or evidence of tumor progression compared with September 06, 2023 Electronically Signed   By: Nancyann Burns M.D.   On: 01/03/2024 11:03     Assessment/Plan 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HCC)  Seizure (HCC)  Gordon [name] is clinically and radiographically stable today.  No new or progressive changes.  MRI  brain demonstrates stable findings overall.  Keppra  may decrease to 500mg  BID.  Ritalin  may con't 10mg  BID as well.  We ask that Gordon [name] return to clinic in 6 months following next brain MRI, or sooner as needed.  All questions were answered. The patient knows to call the clinic with any problems, questions or concerns. No barriers to learning were detected.  The total time spent in the encounter was 40 minutes and more than 50% was on counseling and review of test results   Arthea MARLA Manns, MD Medical Director of Neuro-Oncology [facility] [place] 01/10/2024 9:38 AM

## 2024-01-11 ENCOUNTER — Telehealth: Payer: Self-pay | Admitting: Internal Medicine

## 2024-01-11 NOTE — Telephone Encounter (Signed)
 Scheduled patient for 6 month follow-up. Called and spoke with the patient, he is aware.

## 2024-07-03 ENCOUNTER — Other Ambulatory Visit (HOSPITAL_COMMUNITY)

## 2024-07-17 ENCOUNTER — Inpatient Hospital Stay: Admitting: Internal Medicine
